# Patient Record
Sex: Female | Born: 1975 | Race: Black or African American | Hispanic: No | Marital: Married | Smoking: Never smoker
Health system: Southern US, Community
[De-identification: ages and names within clinical notes are randomized; demographics above are authoritative.]

## PROBLEM LIST (undated history)

## (undated) ENCOUNTER — Inpatient Hospital Stay (HOSPITAL_COMMUNITY): Payer: Self-pay

## (undated) DIAGNOSIS — B999 Unspecified infectious disease: Secondary | ICD-10-CM

## (undated) DIAGNOSIS — F419 Anxiety disorder, unspecified: Secondary | ICD-10-CM

## (undated) DIAGNOSIS — D649 Anemia, unspecified: Secondary | ICD-10-CM

## (undated) DIAGNOSIS — E119 Type 2 diabetes mellitus without complications: Secondary | ICD-10-CM

## (undated) DIAGNOSIS — K589 Irritable bowel syndrome without diarrhea: Secondary | ICD-10-CM

## (undated) DIAGNOSIS — I10 Essential (primary) hypertension: Secondary | ICD-10-CM

## (undated) DIAGNOSIS — K219 Gastro-esophageal reflux disease without esophagitis: Secondary | ICD-10-CM

## (undated) DIAGNOSIS — R7303 Prediabetes: Secondary | ICD-10-CM

## (undated) HISTORY — DX: Unspecified infectious disease: B99.9

## (undated) HISTORY — DX: Essential (primary) hypertension: I10

## (undated) HISTORY — DX: Irritable bowel syndrome, unspecified: K58.9

## (undated) HISTORY — PX: REDUCTION MAMMAPLASTY: SUR839

---
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
## 1999-03-05 ENCOUNTER — Encounter: Payer: Self-pay | Admitting: Emergency Medicine

## 1999-03-05 ENCOUNTER — Emergency Department (HOSPITAL_COMMUNITY): Admission: EM | Admit: 1999-03-05 | Discharge: 1999-03-05 | Payer: Self-pay | Admitting: Emergency Medicine

## 2001-01-06 ENCOUNTER — Other Ambulatory Visit: Admission: RE | Admit: 2001-01-06 | Discharge: 2001-01-06 | Payer: Self-pay | Admitting: Obstetrics and Gynecology

## 2001-11-29 ENCOUNTER — Inpatient Hospital Stay (HOSPITAL_COMMUNITY): Admission: AD | Admit: 2001-11-29 | Discharge: 2001-11-29 | Payer: Self-pay | Admitting: Obstetrics and Gynecology

## 2003-12-25 ENCOUNTER — Emergency Department (HOSPITAL_COMMUNITY): Admission: EM | Admit: 2003-12-25 | Discharge: 2003-12-25 | Payer: Self-pay | Admitting: Emergency Medicine

## 2004-05-11 ENCOUNTER — Ambulatory Visit (HOSPITAL_COMMUNITY): Admission: RE | Admit: 2004-05-11 | Discharge: 2004-05-11 | Payer: Self-pay | Admitting: Obstetrics and Gynecology

## 2004-08-12 ENCOUNTER — Emergency Department (HOSPITAL_COMMUNITY): Admission: EM | Admit: 2004-08-12 | Discharge: 2004-08-12 | Payer: Self-pay | Admitting: Family Medicine

## 2005-07-31 ENCOUNTER — Emergency Department (HOSPITAL_COMMUNITY): Admission: EM | Admit: 2005-07-31 | Discharge: 2005-07-31 | Payer: Self-pay | Admitting: Family Medicine

## 2007-01-05 ENCOUNTER — Emergency Department (HOSPITAL_COMMUNITY): Admission: EM | Admit: 2007-01-05 | Discharge: 2007-01-05 | Payer: Self-pay | Admitting: Emergency Medicine

## 2007-06-28 ENCOUNTER — Other Ambulatory Visit: Admission: RE | Admit: 2007-06-28 | Discharge: 2007-06-28 | Payer: Self-pay | Admitting: Obstetrics and Gynecology

## 2007-08-25 ENCOUNTER — Encounter: Admission: RE | Admit: 2007-08-25 | Discharge: 2007-08-25 | Payer: Self-pay | Admitting: Occupational Medicine

## 2009-05-28 ENCOUNTER — Ambulatory Visit (HOSPITAL_COMMUNITY): Admission: RE | Admit: 2009-05-28 | Discharge: 2009-05-28 | Payer: Self-pay | Admitting: Obstetrics and Gynecology

## 2009-10-23 ENCOUNTER — Inpatient Hospital Stay (HOSPITAL_COMMUNITY): Admission: AD | Admit: 2009-10-23 | Discharge: 2009-10-23 | Payer: Self-pay | Admitting: Obstetrics and Gynecology

## 2009-10-25 ENCOUNTER — Ambulatory Visit (HOSPITAL_COMMUNITY): Admission: RE | Admit: 2009-10-25 | Discharge: 2009-10-25 | Payer: Self-pay | Admitting: Obstetrics and Gynecology

## 2009-10-27 ENCOUNTER — Inpatient Hospital Stay (HOSPITAL_COMMUNITY): Admission: AD | Admit: 2009-10-27 | Discharge: 2009-11-01 | Payer: Self-pay | Admitting: Obstetrics and Gynecology

## 2009-10-29 ENCOUNTER — Encounter (INDEPENDENT_AMBULATORY_CARE_PROVIDER_SITE_OTHER): Payer: Self-pay | Admitting: Obstetrics and Gynecology

## 2010-05-08 ENCOUNTER — Ambulatory Visit (HOSPITAL_COMMUNITY): Admission: RE | Admit: 2010-05-08 | Payer: Self-pay | Source: Home / Self Care | Admitting: Obstetrics and Gynecology

## 2010-07-12 LAB — COMPREHENSIVE METABOLIC PANEL
ALT: 16 U/L (ref 0–35)
ALT: 20 U/L (ref 0–35)
ALT: 17 U/L (ref 0–35)
AST: 20 U/L (ref 0–37)
Albumin: 2.2 g/dL — ABNORMAL LOW (ref 3.5–5.2)
Albumin: 2.6 g/dL — ABNORMAL LOW (ref 3.5–5.2)
Albumin: 2.5 g/dL — ABNORMAL LOW (ref 3.5–5.2)
Alkaline Phosphatase: 119 U/L — ABNORMAL HIGH (ref 39–117)
Alkaline Phosphatase: 152 U/L — ABNORMAL HIGH (ref 39–117)
BUN: 2 mg/dL — ABNORMAL LOW (ref 6–23)
Calcium: 8.5 mg/dL (ref 8.4–10.5)
Calcium: 8.8 mg/dL (ref 8.4–10.5)
Chloride: 103 mEq/L (ref 96–112)
Chloride: 104 mEq/L (ref 96–112)
Creatinine, Ser: 0.51 mg/dL (ref 0.4–1.2)
Creatinine, Ser: 0.58 mg/dL (ref 0.4–1.2)
Creatinine, Ser: 0.46 mg/dL (ref 0.4–1.2)
GFR calc Af Amer: >60 mL/min (ref >60)
GFR calc Af Amer: >60 mL/min (ref >60)
GFR calc non Af Amer: >60 mL/min (ref >60)
GFR calc non Af Amer: >60 mL/min (ref >60)
GFR calc non Af Amer: >60 mL/min (ref >60)
Potassium: 3.1 mEq/L — ABNORMAL LOW (ref 3.5–5.1)
Sodium: 135 mEq/L (ref 135–145)
Sodium: 137 mEq/L (ref 135–145)
Sodium: 137 mEq/L (ref 135–145)
Sodium: 137 mEq/L (ref 135–145)
Total Bilirubin: 0.6 mg/dL (ref 0.3–1.2)
Total Protein: 6.7 g/dL (ref 6.0–8.3)

## 2010-07-12 LAB — URINALYSIS, ROUTINE W REFLEX MICROSCOPIC
Bilirubin Urine: NEGATIVE
Glucose, UA: NEGATIVE mg/dL
Ketones, ur: 40 mg/dL — AB
Protein, ur: NEGATIVE mg/dL

## 2010-07-12 LAB — CBC
HCT: 27.3 % — ABNORMAL LOW (ref 36.0–46.0)
HCT: 31.8 % — ABNORMAL LOW (ref 36.0–46.0)
Hemoglobin: 9.9 g/dL — ABNORMAL LOW (ref 12.0–15.0)
MCH: 30.6 pg (ref 26.0–34.0)
MCH: 30.6 pg (ref 26.0–34.0)
MCHC: 33.9 g/dL (ref 30.0–36.0)
MCHC: 33.9 g/dL (ref 30.0–36.0)
MCHC: 34.1 g/dL (ref 30.0–36.0)
MCHC: 34.2 g/dL (ref 30.0–36.0)
MCHC: 33.9 g/dL (ref 30.0–36.0)
MCV: 90 fL (ref 78.0–100.0)
MCV: 90.6 fL (ref 78.0–100.0)
MCV: 89.9 fL (ref 78.0–100.0)
Platelets: 230 10*3/uL (ref 150–400)
RBC: 2.99 MIL/uL — ABNORMAL LOW (ref 3.87–5.11)
RBC: 3.22 MIL/uL — ABNORMAL LOW (ref 3.87–5.11)
RBC: 3.53 MIL/uL — ABNORMAL LOW (ref 3.87–5.11)
RDW: 15.6 % — ABNORMAL HIGH (ref 11.5–15.5)
RDW: 15.8 % — ABNORMAL HIGH (ref 11.5–15.5)
RDW: 15.9 % — ABNORMAL HIGH (ref 11.5–15.5)
WBC: 8.3 10*3/uL (ref 4.0–10.5)
WBC: 9.7 10*3/uL (ref 4.0–10.5)
WBC: 10.1 10*3/uL (ref 4.0–10.5)

## 2010-07-12 LAB — URIC ACID
Uric Acid, Serum: 3.9 mg/dL (ref 2.4–7.0)
Uric Acid, Serum: 4.7 mg/dL (ref 2.4–7.0)
Uric Acid, Serum: 3.6 mg/dL (ref 2.4–7.0)

## 2010-07-12 LAB — CREATININE, URINE, 24 HOUR: Collection Interval-UCRE24: 24 hours

## 2010-07-12 LAB — LACTATE DEHYDROGENASE: LDH: 215 U/L (ref 94–250)

## 2010-07-12 LAB — PROTEIN, URINE, 24 HOUR
Collection Interval-UPROT: 24 hours
Protein, 24H Urine: 256 mg/d — ABNORMAL HIGH (ref 50–100)
Protein, Urine: 10 mg/dL
Urine Total Volume-UPROT: 2560 mL

## 2010-07-12 LAB — RPR: RPR Ser Ql: NONREACTIVE

## 2011-07-19 ENCOUNTER — Ambulatory Visit: Payer: Self-pay | Admitting: Obstetrics and Gynecology

## 2011-07-29 ENCOUNTER — Ambulatory Visit (INDEPENDENT_AMBULATORY_CARE_PROVIDER_SITE_OTHER): Payer: 59 | Admitting: Obstetrics and Gynecology

## 2011-07-29 DIAGNOSIS — Z01419 Encounter for gynecological examination (general) (routine) without abnormal findings: Secondary | ICD-10-CM

## 2011-07-30 ENCOUNTER — Ambulatory Visit: Payer: Self-pay | Admitting: Family Medicine

## 2011-08-13 ENCOUNTER — Ambulatory Visit: Payer: Self-pay | Admitting: Family Medicine

## 2011-08-19 ENCOUNTER — Encounter: Payer: Self-pay | Admitting: Family Medicine

## 2011-08-19 ENCOUNTER — Ambulatory Visit (INDEPENDENT_AMBULATORY_CARE_PROVIDER_SITE_OTHER): Payer: 59 | Admitting: Family Medicine

## 2011-08-19 VITALS — BP 126/80 | HR 100 | Resp 18 | Ht 66.0 in | Wt 238.1 lb

## 2011-08-19 DIAGNOSIS — E669 Obesity, unspecified: Secondary | ICD-10-CM

## 2011-08-19 DIAGNOSIS — F419 Anxiety disorder, unspecified: Secondary | ICD-10-CM | POA: Insufficient documentation

## 2011-08-19 DIAGNOSIS — J309 Allergic rhinitis, unspecified: Secondary | ICD-10-CM

## 2011-08-19 DIAGNOSIS — Z6835 Body mass index (BMI) 35.0-35.9, adult: Secondary | ICD-10-CM | POA: Insufficient documentation

## 2011-08-19 DIAGNOSIS — J302 Other seasonal allergic rhinitis: Secondary | ICD-10-CM | POA: Insufficient documentation

## 2011-08-19 DIAGNOSIS — F411 Generalized anxiety disorder: Secondary | ICD-10-CM

## 2011-08-19 DIAGNOSIS — I10 Essential (primary) hypertension: Secondary | ICD-10-CM

## 2011-08-19 NOTE — Assessment & Plan Note (Signed)
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 She is thinking about stopping all together which I agree with since she is not taking daily.

## 2011-08-19 NOTE — Assessment & Plan Note (Signed)
Given samples of claritin

## 2011-08-19 NOTE — Assessment & Plan Note (Signed)
BP at goal, no change to meds 

## 2011-08-19 NOTE — Patient Instructions (Signed)
You can try the Claritin for allergies Continue your blood pressure medication I recommend exercise 30 minutes 5 days a week  I will get the records - we will send copies  Hold on fish oil until I see the records Take a multivitamin  Fax immunizations from Employee health  F/U 4 months

## 2011-08-19 NOTE — Assessment & Plan Note (Signed)
Labs recently done, will obtain records for FLP Pt to work on weight loss, start exercise program

## 2011-08-19 NOTE — Progress Notes (Signed)
  Subjective:    Patient ID: Michelle Wilson, female    DOB: 1975-10-25, 36 y.o.   MRN: [id number]  HPI  Pt here to establish care, previous PCP [facility] - Dr. Phillips Odor GYN- Dr. Normand Sloop Hypertension-history of hypertension since age 39. She's been on medication since that time. His family history of high blood pressure which could mother and father a quiet visit very young ages. She did have difficulty with her blood pressure during the delivery of her child 2 years ago.  Anxiety- she has a lot of stress and anxiety regarding her work. 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 At times she does not sleep very well. She is also a working mother with the top of. She was prescribed Lexapro however she only takes 2.5 mg once a week to help her sleep. She's never taken this on a regular basis  Seasonal allergies- has not tried any OTC meds before.  Chronic constipation- uses fiber and water intake for treatment Review of Systems   GEN- denies fatigue, fever, weight loss,weakness, recent illness HEENT- denies eye drainage, change in vision, nasal discharge, CVS- denies chest pain, palpitations RESP- denies SOB, cough, wheeze ABD- denies N/V, +constipation, abd pain GU- denies dysuria, hematuria, dribbling, incontinence MSK- denies joint pain, muscle aches, injury Neuro- denies headache, dizziness, syncope, seizure activity Psych-+anxiety, denies depression      Objective:   Physical Exam GEN- NAD, alert and oriented x3, obese HEENT- PERRL, EOMI, non injected sclera, pink conjunctiva, MMM, oropharynx clear Neck- Supple, no thryomegaly CVS- RRR, no murmur RESP-CTAB ABD-NABS,soft, NT,ND EXT- No edema Pulses- Radial, DP- 2+ Psych- not overly anxious, or depressed appearing, normal affect, normal speech        Assessment & Plan:

## 2011-10-01 ENCOUNTER — Telehealth: Payer: Self-pay | Admitting: Obstetrics and Gynecology

## 2011-10-01 NOTE — Telephone Encounter (Signed)
Spoke with pt rgd msg pt states early preg was told by ND will need o change bp meds pt wants appt to discuss meds pt has appt 10/05/11 at 130 with ND pt voice understanding

## 2011-10-01 NOTE — Telephone Encounter (Signed)
Triage/cht received 

## 2011-10-05 ENCOUNTER — Encounter: Payer: Self-pay | Admitting: Obstetrics and Gynecology

## 2011-10-05 ENCOUNTER — Ambulatory Visit (INDEPENDENT_AMBULATORY_CARE_PROVIDER_SITE_OTHER): Payer: Commercial Managed Care - PPO | Admitting: Obstetrics and Gynecology

## 2011-10-05 VITALS — BP 128/70 | Wt 240.0 lb

## 2011-10-05 DIAGNOSIS — O169 Unspecified maternal hypertension, unspecified trimester: Secondary | ICD-10-CM

## 2011-10-05 DIAGNOSIS — N912 Amenorrhea, unspecified: Secondary | ICD-10-CM

## 2011-10-05 DIAGNOSIS — O139 Gestational [pregnancy-induced] hypertension without significant proteinuria, unspecified trimester: Secondary | ICD-10-CM

## 2011-10-05 LAB — POCT URINE PREGNANCY: Preg Test, Ur: POSITIVE

## 2011-10-05 MED ORDER — ONDANSETRON HCL 4 MG PO TABS: 4.0000 mg | ORAL_TABLET | Freq: Three times a day (TID) | ORAL | Status: AC | PRN

## 2011-10-05 MED ORDER — METHYLDOPA 250 MG PO TABS: 250.0000 mg | ORAL_TABLET | Freq: Three times a day (TID) | ORAL | Status: DC

## 2011-10-05 NOTE — Progress Notes (Signed)
Pt had positive upt and would like to discuss medications that are safe to take. Pt states she was told she could no longer continue her blood pressure medication once she became pregnant.  She is till taking the HYzaar.  She is no longer taking lexapro. She also has a little bit of nausea.  She states her last period was late an only lasted three days which is abnormal BP 128/70  Wt 240 lb (108.863 kg)  LMP 09/01/2011 Physical Examination: General appearance - alert, well appearing, and in no distress Mental status - alert, oriented to person, place, and time Chest - clear to auscultation, no wheezes, rales or rhonchi, symmetric air entry Heart - normal rate and regular rhythm Abdomen - soft, nontender, nondistended, no masses or organomegaly Extremities - peripheral pulses normal, no pedal edema, no clubbing or cyanosis IUP at 4 ans 6/7 weeks by dates Stop Mauri Reading it is a category D.  Will give aldomet 250 mg qd Pt had mildly elevated protein C and took baby ASA.  Pt thought she was told to stop it.  Will check MFM consult  zofran PRN [place] for [place] @[place]  Pt with h/o c/s.  She desires repeat chtn  Check baseline 24 hr urine and plan growth [place] to start @ 24 weeks

## 2011-10-20 ENCOUNTER — Encounter: Payer: Commercial Managed Care - PPO | Admitting: Obstetrics and Gynecology

## 2011-10-20 ENCOUNTER — Ambulatory Visit (INDEPENDENT_AMBULATORY_CARE_PROVIDER_SITE_OTHER): Payer: Commercial Managed Care - PPO

## 2011-10-20 DIAGNOSIS — N912 Amenorrhea, unspecified: Secondary | ICD-10-CM

## 2011-11-24 ENCOUNTER — Ambulatory Visit (INDEPENDENT_AMBULATORY_CARE_PROVIDER_SITE_OTHER): Payer: Commercial Managed Care - PPO | Admitting: Obstetrics and Gynecology

## 2011-11-24 DIAGNOSIS — Z331 Pregnant state, incidental: Secondary | ICD-10-CM

## 2011-11-24 MED ORDER — PANTOPRAZOLE SODIUM 20 MG PO TBEC: 20.0000 mg | DELAYED_RELEASE_TABLET | Freq: Every day | ORAL | Status: DC

## 2011-11-25 NOTE — Progress Notes (Signed)
Pt in office 11-24-11 for NOB work up pt schd in error pt to have New Ob Interview first pt c/o severe heart burn wants rx consult with ND pt can have protonix 20 mg pt advised rx sent to pharm pt voice understanding pt BP 118/72

## 2011-12-14 ENCOUNTER — Encounter: Payer: Self-pay | Admitting: Obstetrics and Gynecology

## 2011-12-14 ENCOUNTER — Ambulatory Visit (INDEPENDENT_AMBULATORY_CARE_PROVIDER_SITE_OTHER): Payer: Commercial Managed Care - PPO | Admitting: Obstetrics and Gynecology

## 2011-12-14 VITALS — BP 130/80 | Wt 257.0 lb

## 2011-12-14 DIAGNOSIS — E669 Obesity, unspecified: Secondary | ICD-10-CM

## 2011-12-14 DIAGNOSIS — Z9889 Other specified postprocedural states: Secondary | ICD-10-CM

## 2011-12-14 DIAGNOSIS — I10 Essential (primary) hypertension: Secondary | ICD-10-CM

## 2011-12-14 DIAGNOSIS — Z331 Pregnant state, incidental: Secondary | ICD-10-CM

## 2011-12-14 DIAGNOSIS — Z98891 History of uterine scar from previous surgery: Secondary | ICD-10-CM | POA: Insufficient documentation

## 2011-12-14 LAB — POCT WET PREP (WET MOUNT): Clue Cells Wet Prep Whiff POC: NEGATIVE

## 2011-12-14 LAB — POCT URINALYSIS DIPSTICK
Bilirubin, UA: NEGATIVE
Blood, UA: NEGATIVE
Glucose, UA: NEGATIVE
Ketones, UA: NEGATIVE
Spec Grav, UA: 1.015
pH, UA: 7

## 2011-12-14 NOTE — Patient Instructions (Signed)
ABCs of Pregnancy A Antepartum care is very important. Be sure you see your doctor and get prenatal care as soon as you think you are pregnant. At this time, you will be tested for infection, genetic abnormalities and potential problems with you and the pregnancy. This is the time to discuss diet, exercise, work, medications, labor, pain medication during labor and the possibility of a cesarean delivery. Ask any questions that may concern you. It is important to see your doctor regularly throughout your pregnancy. Avoid exposure to toxic substances and chemicals - such as cleaning solvents, lead and mercury, some insecticides, and paint. Pregnant women should avoid exposure to paint fumes, and fumes that cause you to feel ill, dizzy or faint. When possible, it is a good idea to have a pre-pregnancy consultation with your caregiver to begin some important recommendations your caregiver suggests such as, taking folic acid, exercising, quitting smoking, avoiding alcoholic beverages, etc. B Breastfeeding is the healthiest choice for both you and your baby. It has many nutritional benefits for the baby and health benefits for the mother. It also creates a very tight and loving bond between the baby and mother. Talk to your doctor, your family and friends, and your employer about how you choose to feed your baby and how they can support you in your decision. Not all birth defects can be prevented, but a woman can take actions that may increase her chance of having a healthy baby. Many birth defects happen very early in pregnancy, sometimes before a woman even knows she is pregnant. Birth defects or abnormalities of any child in your or the father's family should be discussed with your caregiver. Get a good support bra as your breast size changes. Wear it especially when you exercise and when nursing.  C Celebrate the news of your pregnancy with the your spouse/father and family. Childbirth classes are helpful to  take for you and the spouse/father because it helps to understand what happens during the pregnancy, labor and delivery. Cesarean delivery should be discussed with your doctor so you are prepared for that possibility. The pros and cons of circumcision if it is a boy, should be discussed with your pediatrician. Cigarette smoking during pregnancy can result in low birth weight babies. It has been associated with infertility, miscarriages, tubal pregnancies, infant death (mortality) and poor health (morbidity) in childhood. Additionally, cigarette smoking may cause long-term learning disabilities. If you smoke, you should try to quit before getting pregnant and not smoke during the pregnancy. Secondary smoke may also harm a mother and her developing baby. It is a good idea to ask people to stop smoking around you during your pregnancy and after the baby is born. Extra calcium is necessary when you are pregnant and is found in your prenatal vitamin, in dairy products, green leafy vegetables and in calcium supplements. D A healthy diet according to your current weight and height, along with vitamins and mineral supplements should be discussed with your caregiver. Domestic abuse or violence should be made known to your doctor right away to get the situation corrected. Drink more water when you exercise to keep hydrated. Discomfort of your back and legs usually develops and progresses from the middle of the second trimester through to delivery of the baby. This is because of the enlarging baby and uterus, which may also affect your balance. Do not take illegal drugs. Illegal drugs can seriously harm the baby and you. Drink extra fluids (water is best) throughout pregnancy to help   your body keep up with the increases in your blood volume. Drink at least 6 to 8 glasses of water, fruit juice, or milk each day. A good way to know you are drinking enough fluid is when your urine looks almost like clear water or is very light  yellow.  E Eat healthy to get the nutrients you and your unborn baby need. Your meals should include the five basic food groups. Exercise (30 minutes of light to moderate exercise a day) is important and encouraged during pregnancy, if there are no medical problems or problems with the pregnancy. Exercise that causes discomfort or dizziness should be stopped and reported to your caregiver. Emotions during pregnancy can change from being ecstatic to depression and should be understood by you, your partner and your family. F Fetal screening with ultrasound, amniocentesis and monitoring during pregnancy and labor is common and sometimes necessary. Take 400 micrograms of folic acid daily both before, when possible, and during the first few months of pregnancy to reduce the risk of birth defects of the brain and spine. All women who could possibly become pregnant should take a vitamin with folic acid, every day. It is also important to eat a healthy diet with fortified foods (enriched grain products, including cereals, rice, breads, and pastas) and foods with natural sources of folate (orange juice, green leafy vegetables, beans, peanuts, broccoli, asparagus, peas, and lentils). The father should be involved with all aspects of the pregnancy including, the prenatal care, childbirth classes, labor, delivery, and postpartum time. Fathers may also have emotional concerns about being a father, financial needs, and raising a family. G Genetic testing should be done appropriately. It is important to know your family and the father's history. If there have been problems with pregnancies or birth defects in your family, report these to your doctor. Also, genetic counselors can talk with you about the information you might need in making decisions about having a family. You can call a major medical center in your area for help in finding a board-certified genetic counselor. Genetic testing and counseling should be done  before pregnancy when possible, especially if there is a history of problems in the mother's or father's family. Certain ethnic backgrounds are more at risk for genetic defects. H Get familiar with the hospital where you will be having your baby. Get to know how long it takes to get there, the labor and delivery area, and the hospital procedures. Be sure your medical insurance is accepted there. Get your home ready for the baby including, clothes, the baby's room (when possible), furniture and car seat. Hand washing is important throughout the day, especially after handling raw meat and poultry, changing the baby's diaper or using the bathroom. This can help prevent the spread of many bacteria and viruses that cause infection. Your hair may become dry and thinner, but will return to normal a few weeks after the baby is born. Heartburn is a common problem that can be treated by taking antacids recommended by your caregiver, eating smaller meals 5 or 6 times a day, not drinking liquids when eating, drinking between meals and raising the head of your bed 2 to 3 inches. I Insurance to cover you, the baby, doctor and hospital should be reviewed so that you will be prepared to pay any costs not covered by your insurance plan. If you do not have medical insurance, there are usually clinics and services available for you in your community. Take 30 milligrams of iron during   your pregnancy as prescribed by your doctor to reduce the risk of low red blood cells (anemia) later in pregnancy. All women of childbearing age should eat a diet rich in iron. J There should be a joint effort for the mother, father and any other children to adapt to the pregnancy financially, emotionally, and psychologically during the pregnancy. Join a support group for moms-to-be. Or, join a class on parenting or childbirth. Have the family participate when possible. K Know your limits. Let your caregiver know if you experience any of the  following:   Pain of any kind.   Strong cramps.   You develop a lot of weight in a short period of time (5 pounds in 3 to 5 days).   Vaginal bleeding, leaking of amniotic fluid.   Headache, vision problems.   Dizziness, fainting, shortness of breath.   Chest pain.   Fever of 102 F (38.9 C) or higher.   Gush of clear fluid from your vagina.   Painful urination.   Domestic violence.   Irregular heartbeat (palpitations).   Rapid beating of the heart (tachycardia).   Constant feeling sick to your stomach (nauseous) and vomiting.   Trouble walking, fluid retention (edema).   Muscle weakness.   If your baby has decreased activity.   Persistent diarrhea.   Abnormal vaginal discharge.   Uterine contractions at 20-minute intervals.   Back pain that travels down your leg.  L Learn and practice that what you eat and drink should be in moderation and healthy for you and your baby. Legal drugs such as alcohol and caffeine are important issues for pregnant women. There is no safe amount of alcohol a woman can drink while pregnant. Fetal alcohol syndrome, a disorder characterized by growth retardation, facial abnormalities, and central nervous system dysfunction, is caused by a woman's use of alcohol during pregnancy. Caffeine, found in tea, coffee, soft drinks and chocolate, should also be limited. Be sure to read labels when trying to cut down on caffeine during pregnancy. More than 200 foods, beverages, and over-the-counter medications contain caffeine and have a high salt content! There are coffees and teas that do not contain caffeine. M Medical conditions such as diabetes, epilepsy, and high blood pressure should be treated and kept under control before pregnancy when possible, but especially during pregnancy. Ask your caregiver about any medications that may need to be changed or adjusted during pregnancy. If you are currently taking any medications, ask your caregiver if it  is safe to take them while you are pregnant or before getting pregnant when possible. Also, be sure to discuss any herbs or vitamins you are taking. They are medicines, too! Discuss with your doctor all medications, prescribed and over-the-counter, that you are taking. During your prenatal visit, discuss the medications your doctor may give you during labor and delivery. N Never be afraid to ask your doctor or caregiver questions about your health, the progress of the pregnancy, family problems, stressful situations, and recommendation for a pediatrician, if you do not have one. It is better to take all precautions and discuss any questions or concerns you may have during your office visits. It is a good idea to write down your questions before you visit the doctor. O Over-the-counter cough and cold remedies may contain alcohol or other ingredients that should be avoided during pregnancy. Ask your caregiver about prescription, herbs or over-the-counter medications that you are taking or may consider taking while pregnant.  P Physical activity during pregnancy can   benefit both you and your baby by lessening discomfort and fatigue, providing a sense of well-being, and increasing the likelihood of early recovery after delivery. Light to moderate exercise during pregnancy strengthens the belly (abdominal) and back muscles. This helps improve posture. Practicing yoga, walking, swimming, and cycling on a stationary bicycle are usually safe exercises for pregnant women. Avoid scuba diving, exercise at high altitudes (over 3000 feet), skiing, horseback riding, contact sports, etc. Always check with your doctor before beginning any kind of exercise, especially during pregnancy and especially if you did not exercise before getting pregnant. Q Queasiness, stomach upset and morning sickness are common during pregnancy. Eating a couple of crackers or dry toast before getting out of bed. Foods that you normally love may  make you feel sick to your stomach. You may need to substitute other nutritious foods. Eating 5 or 6 small meals a day instead of 3 large ones may make you feel better. Do not drink with your meals, drink between meals. Questions that you have should be written down and asked during your prenatal visits. R Read about and make plans to baby-proof your home. There are important tips for making your home a safer environment for your baby. Review the tips and make your home safer for you and your baby. Read food labels regarding calories, salt and fat content in the food. S Saunas, hot tubs, and steam rooms should be avoided while you are pregnant. Excessive high heat may be harmful during your pregnancy. Your caregiver will screen and examine you for sexually transmitted diseases and genetic disorders during your prenatal visits. Learn the signs of labor. Sexual relations while pregnant is safe unless there is a medical or pregnancy problem and your caregiver advises against it. T Traveling long distances should be avoided especially in the third trimester of your pregnancy. If you do have to travel out of state, be sure to take a copy of your medical records and medical insurance plan with you. You should not travel long distances without seeing your doctor first. Most airlines will not allow you to travel after 36 weeks of pregnancy. Toxoplasmosis is an infection caused by a parasite that can seriously harm an unborn baby. Avoid eating undercooked meat and handling cat litter. Be sure to wear gloves when gardening. Tingling of the hands and fingers is not unusual and is due to fluid retention. This will go away after the baby is born. U Womb (uterus) size increases during the first trimester. Your kidneys will begin to function more efficiently. This may cause you to feel the need to urinate more often. You may also leak urine when sneezing, coughing or laughing. This is due to the growing uterus pressing  against your bladder, which lies directly in front of and slightly under the uterus during the first few months of pregnancy. If you experience burning along with frequency of urination or bloody urine, be sure to tell your doctor. The size of your uterus in the third trimester may cause a problem with your balance. It is advisable to maintain good posture and avoid wearing high heels during this time. An ultrasound of your baby may be necessary during your pregnancy and is safe for you and your baby. V Vaccinations are an important concern for pregnant women. Get needed vaccines before pregnancy. Center for Disease Control (www.cdc.gov) has clear guidelines for the use of vaccines during pregnancy. Review the list, be sure to discuss it with your doctor. Prenatal vitamins are helpful   and healthy for you and the baby. Do not take extra vitamins except what is recommended. Taking too much of certain vitamins can cause overdose problems. Continuous vomiting should be reported to your caregiver. Varicose veins may appear especially if there is a family history of varicose veins. They should subside after the delivery of the baby. Support hose helps if there is leg discomfort. W Being overweight or underweight during pregnancy may cause problems. Try to get within 15 pounds of your ideal weight before pregnancy. Remember, pregnancy is not a time to be dieting! Do not stop eating or start skipping meals as your weight increases. Both you and your baby need the calories and nutrition you receive from a healthy diet. Be sure to consult with your doctor about your diet. There is a formula and diet plan available depending on whether you are overweight or underweight. Your caregiver or nutritionist can help and advise you if necessary. X Avoid X-rays. If you must have dental work or diagnostic tests, tell your dentist or physician that you are pregnant so that extra care can be taken. X-rays should only be taken when  the risks of not taking them outweigh the risk of taking them. If needed, only the minimum amount of radiation should be used. When X-rays are necessary, protective lead shields should be used to cover areas of the body that are not being X-rayed. Y Your baby loves you. Breastfeeding your baby creates a loving and very close bond between the two of you. Give your baby a healthy environment to live in while you are pregnant. Infants and children require constant care and guidance. Their health and safety should be carefully watched at all times. After the baby is born, rest or take a nap when the baby is sleeping. Z Get your ZZZs. Be sure to get plenty of rest. Resting on your side as often as possible, especially on your left side is advised. It provides the best circulation to your baby and helps reduce swelling. Try taking a nap for 30 to 45 minutes in the afternoon when possible. After the baby is born rest or take a nap when the baby is sleeping. Try elevating your feet for that amount of time when possible. It helps the circulation in your legs and helps reduce swelling.  Most information courtesy of the CDC. Document Released: 04/12/2005 Document Revised: 04/01/2011 Document Reviewed: 12/25/2008 [employer] Patient Information 2012 [employer]. 

## 2011-12-14 NOTE — Assessment & Plan Note (Signed)
Pt on aldomet Plan [place] for growth q 4 weeks start at 24 weeks bpp or nst 2x weekly start at 32 weeks

## 2011-12-14 NOTE — Progress Notes (Signed)
This is thew pts New OB visit She c/o ptylism and large weight gain Past Medical History  Diagnosis Date  . Hypertension     Aldomet 250 mg Q day  . Infection     Yeast inf; no frequent  . IBS (irritable bowel syndrome)     no issues currently   Past Surgical History  Procedure Date  . Breast reduction surgery 1997  . Cesarean section 2011   History   Social History  . Marital Status: Married    Spouse Name: Tyniah Kastens    Number of Children: 1  . Years of Education: N/A   Occupational History  . Print production planner    Social History Main Topics  . Smoking status: Never Smoker   . Smokeless tobacco: Never Used  . Alcohol Use: Yes     Occasionally  . Drug Use: No  . Sexually Active: Yes -- Female partner(s)   Other Topics Concern  . Not on file   Social History Narrative  . No narrative on file   Family History  Problem Relation Age of Onset  . Hypertension Mother   . Hypertension Father   . Hypertension Maternal Grandmother   . Hypertension Maternal Grandfather   . Hypertension Paternal Grandmother   . Hypertension Paternal Grandfather   . Hyperthyroidism Maternal Aunt     x 3  . Stroke Maternal Grandfather   . Schizophrenia Maternal Aunt   . Cancer Maternal Uncle     Lung deceased  Pt without complaints Physical Examination: General appearance - alert, well appearing, and in no distress Mental status - normal mood, behavior, speech, dress, motor activity, and thought processes Neck - supple, no significant adenopathy, thyroid exam: thyroid is normal in size without nodules or tenderness Chest - clear to auscultation, no wheezes, rales or rhonchi, symmetric air entry Heart - normal rate and regular rhythm Abdomen - soft, nontender, nondistended, no masses or organomegaly Breasts - breasts appear normal, no suspicious masses, no skin or nipple changes or axillary nodes Pelvic - normal external genitalia, vulva, vagina, cervix, uterus and adnexa Rectal -  normal rectal, no masses, rectal exam not indicated Back exam - full range of motion, no tenderness, palpable spasm or pain on motion Neurological - alert, oriented, normal speech, no focal findings or movement disorder noted Musculoskeletal - no joint tenderness, deformity or swelling Extremities - no edema, redness or tenderness in the calves or thighs Skin - normal coloration and turgor, no rashes, no suspicious skin lesions noted Routine exam Pap sent no normal 4/13 Pt declined genetic testing CHTN controlled on aldomet continue  ptylism check TSH pt declined antiemetics pt plans to have a repeat cesarean section RT 4 weeks for anantomy [place] and glucola

## 2011-12-14 NOTE — Assessment & Plan Note (Signed)
Do early glucola

## 2011-12-15 LAB — TSH: TSH: 1.109 u[IU]/mL (ref 0.350–4.500)

## 2011-12-15 LAB — PRENATAL PANEL VII
Antibody Screen: NEGATIVE
Eosinophils Absolute: 0.1 10*3/uL (ref 0.0–0.7)
Eosinophils Relative: 1 % (ref 0–5)
HCT: 31.1 % — ABNORMAL LOW (ref 36.0–46.0)
Lymphocytes Relative: 21 % (ref 12–46)
Lymphs Abs: 1.8 10*3/uL (ref 0.7–4.0)
MCH: 26.7 pg (ref 26.0–34.0)
MCV: 79.9 fL (ref 78.0–100.0)
Monocytes Absolute: 0.6 10*3/uL (ref 0.1–1.0)
Platelets: 258 10*3/uL (ref 150–400)
RBC: 3.89 MIL/uL (ref 3.87–5.11)
Rh Type: POSITIVE
Rubella: 15.7 IU/mL — ABNORMAL HIGH
WBC: 8.7 10*3/uL (ref 4.0–10.5)

## 2011-12-15 LAB — OB RESULTS CONSOLE RPR
RPR: NONREACTIVE
RPR: NONREACTIVE

## 2011-12-15 LAB — OB RESULTS CONSOLE HIV ANTIBODY (ROUTINE TESTING): HIV: NONREACTIVE

## 2011-12-16 LAB — CULTURE, OB URINE

## 2011-12-16 LAB — HEMOGLOBINOPATHY EVALUATION: Hgb A: 96.9 % (ref 96.8–97.8)

## 2011-12-20 ENCOUNTER — Ambulatory Visit: Payer: 59 | Admitting: Family Medicine

## 2012-01-11 ENCOUNTER — Ambulatory Visit (INDEPENDENT_AMBULATORY_CARE_PROVIDER_SITE_OTHER): Payer: 59

## 2012-01-11 ENCOUNTER — Ambulatory Visit (INDEPENDENT_AMBULATORY_CARE_PROVIDER_SITE_OTHER): Payer: 59 | Admitting: Obstetrics and Gynecology

## 2012-01-11 ENCOUNTER — Other Ambulatory Visit: Payer: 59

## 2012-01-11 ENCOUNTER — Encounter: Payer: Self-pay | Admitting: Obstetrics and Gynecology

## 2012-01-11 VITALS — BP 122/70 | Wt 259.0 lb

## 2012-01-11 DIAGNOSIS — O169 Unspecified maternal hypertension, unspecified trimester: Secondary | ICD-10-CM

## 2012-01-11 DIAGNOSIS — Z331 Pregnant state, incidental: Secondary | ICD-10-CM

## 2012-01-11 DIAGNOSIS — Z3689 Encounter for other specified antenatal screening: Secondary | ICD-10-CM

## 2012-01-11 DIAGNOSIS — I10 Essential (primary) hypertension: Secondary | ICD-10-CM

## 2012-01-11 NOTE — Patient Instructions (Signed)

## 2012-01-11 NOTE — Progress Notes (Signed)
[place] for anatomy s=d cx 3.65 cm Breech presentation Posterior placenta Normal fluid All anatomy that was seen was normal F/u anantomy at growth [place] Pt declined genetic testing glucola today RT 2 weeks BP controlled on aldomet Pt c/o constant back pain.  Recommend tylenol.  Pt declined PT

## 2012-01-11 NOTE — Progress Notes (Signed)
glucola due at 3:30

## 2012-01-12 ENCOUNTER — Encounter: Payer: Self-pay | Admitting: Family Medicine

## 2012-01-13 ENCOUNTER — Ambulatory Visit: Payer: Self-pay | Admitting: Family Medicine

## 2012-01-17 LAB — US OB COMP + 14 WK

## 2012-01-28 ENCOUNTER — Ambulatory Visit (INDEPENDENT_AMBULATORY_CARE_PROVIDER_SITE_OTHER): Payer: 59 | Admitting: Obstetrics and Gynecology

## 2012-01-28 ENCOUNTER — Encounter: Payer: Self-pay | Admitting: Obstetrics and Gynecology

## 2012-01-28 VITALS — BP 120/76 | Wt 265.0 lb

## 2012-01-28 DIAGNOSIS — O169 Unspecified maternal hypertension, unspecified trimester: Secondary | ICD-10-CM

## 2012-01-28 DIAGNOSIS — I10 Essential (primary) hypertension: Secondary | ICD-10-CM

## 2012-01-28 NOTE — Progress Notes (Signed)
Pt without c/o CHTN BP well controlled RT 2 weeks

## 2012-01-28 NOTE — Progress Notes (Signed)
Pt would like to know when she is supposed to have f/u anatomy u/s

## 2012-02-11 ENCOUNTER — Ambulatory Visit (INDEPENDENT_AMBULATORY_CARE_PROVIDER_SITE_OTHER): Payer: 59 | Admitting: Obstetrics and Gynecology

## 2012-02-11 ENCOUNTER — Encounter: Payer: Self-pay | Admitting: Obstetrics and Gynecology

## 2012-02-11 VITALS — BP 140/86 | Wt 265.0 lb

## 2012-02-11 DIAGNOSIS — Z23 Encounter for immunization: Secondary | ICD-10-CM

## 2012-02-11 DIAGNOSIS — O169 Unspecified maternal hypertension, unspecified trimester: Secondary | ICD-10-CM

## 2012-02-11 NOTE — Progress Notes (Signed)
Pt unsure if she will get flu shot here or at her job.

## 2012-02-11 NOTE — Progress Notes (Signed)
Pt without c/o She desires fllu vaccine.  We will give today Will check her BP QD on her job.  RT 4 weeks [place] for growth and glucola @[place] 

## 2012-02-11 NOTE — Patient Instructions (Signed)
Fetal Movement Counts Patient Name: __________________________________________________ Patient Due Date: ____________________ Kick counts is highly recommended in high risk pregnancies, but it is a good idea for every pregnant woman to do. Start counting fetal movements at 28 weeks of the pregnancy. Fetal movements increase after eating a full meal or eating or drinking something sweet (the blood sugar is higher). It is also important to drink plenty of fluids (well hydrated) before doing the count. Lie on your left side because it helps with the circulation or you can sit in a comfortable chair with your arms over your belly (abdomen) with no distractions around you. DOING THE COUNT  Try to do the count the same time of day each time you do it.  Mark the day and time, then see how long it takes for you to feel 10 movements (kicks, flutters, swishes, rolls). You should have at least 10 movements within 2 hours. You will most likely feel 10 movements in much less than 2 hours. If you do not, wait an hour and count again. After a couple of days you will see a pattern.  What you are looking for is a change in the pattern or not enough counts in 2 hours. Is it taking longer in time to reach 10 movements? SEEK MEDICAL CARE IF:  You feel less than 10 counts in 2 hours. Tried twice.  No movement in one hour.  The pattern is changing or taking longer each day to reach 10 counts in 2 hours.  You feel the baby is not moving as it usually does. Date: ____________ Movements: ____________ Start time: ____________ Finish time: ____________  Date: ____________ Movements: ____________ Start time: ____________ Finish time: ____________ Date: ____________ Movements: ____________ Start time: ____________ Finish time: ____________ Date: ____________ Movements: ____________ Start time: ____________ Finish time: ____________ Date: ____________ Movements: ____________ Start time: ____________ Finish time:  ____________ Date: ____________ Movements: ____________ Start time: ____________ Finish time: ____________ Date: ____________ Movements: ____________ Start time: ____________ Finish time: ____________ Date: ____________ Movements: ____________ Start time: ____________ Finish time: ____________  Date: ____________ Movements: ____________ Start time: ____________ Finish time: ____________ Date: ____________ Movements: ____________ Start time: ____________ Finish time: ____________ Date: ____________ Movements: ____________ Start time: ____________ Finish time: ____________ Date: ____________ Movements: ____________ Start time: ____________ Finish time: ____________ Date: ____________ Movements: ____________ Start time: ____________ Finish time: ____________ Date: ____________ Movements: ____________ Start time: ____________ Finish time: ____________ Date: ____________ Movements: ____________ Start time: ____________ Finish time: ____________  Date: ____________ Movements: ____________ Start time: ____________ Finish time: ____________ Date: ____________ Movements: ____________ Start time: ____________ Finish time: ____________ Date: ____________ Movements: ____________ Start time: ____________ Finish time: ____________ Date: ____________ Movements: ____________ Start time: ____________ Finish time: ____________ Date: ____________ Movements: ____________ Start time: ____________ Finish time: ____________ Date: ____________ Movements: ____________ Start time: ____________ Finish time: ____________ Date: ____________ Movements: ____________ Start time: ____________ Finish time: ____________  Date: ____________ Movements: ____________ Start time: ____________ Finish time: ____________ Date: ____________ Movements: ____________ Start time: ____________ Finish time: ____________ Date: ____________ Movements: ____________ Start time: ____________ Finish time: ____________ Date: ____________ Movements:  ____________ Start time: ____________ Finish time: ____________ Date: ____________ Movements: ____________ Start time: ____________ Finish time: ____________ Date: ____________ Movements: ____________ Start time: ____________ Finish time: ____________ Date: ____________ Movements: ____________ Start time: ____________ Finish time: ____________  Date: ____________ Movements: ____________ Start time: ____________ Finish time: ____________ Date: ____________ Movements: ____________ Start time: ____________ Finish time: ____________ Date: ____________ Movements: ____________ Start time: ____________ Finish time: ____________ Date: ____________ Movements:   ____________ Start time: ____________ Finish time: ____________ Date: ____________ Movements: ____________ Start time: ____________ Finish time: ____________ Date: ____________ Movements: ____________ Start time: ____________ Finish time: ____________ Date: ____________ Movements: ____________ Start time: ____________ Finish time: ____________  Date: ____________ Movements: ____________ Start time: ____________ Finish time: ____________ Date: ____________ Movements: ____________ Start time: ____________ Finish time: ____________ Date: ____________ Movements: ____________ Start time: ____________ Finish time: ____________ Date: ____________ Movements: ____________ Start time: ____________ Finish time: ____________ Date: ____________ Movements: ____________ Start time: ____________ Finish time: ____________ Date: ____________ Movements: ____________ Start time: ____________ Finish time: ____________ Date: ____________ Movements: ____________ Start time: ____________ Finish time: ____________  Date: ____________ Movements: ____________ Start time: ____________ Finish time: ____________ Date: ____________ Movements: ____________ Start time: ____________ Finish time: ____________ Date: ____________ Movements: ____________ Start time: ____________ Finish  time: ____________ Date: ____________ Movements: ____________ Start time: ____________ Finish time: ____________ Date: ____________ Movements: ____________ Start time: ____________ Finish time: ____________ Date: ____________ Movements: ____________ Start time: ____________ Finish time: ____________ Date: ____________ Movements: ____________ Start time: ____________ Finish time: ____________  Date: ____________ Movements: ____________ Start time: ____________ Finish time: ____________ Date: ____________ Movements: ____________ Start time: ____________ Finish time: ____________ Date: ____________ Movements: ____________ Start time: ____________ Finish time: ____________ Date: ____________ Movements: ____________ Start time: ____________ Finish time: ____________ Date: ____________ Movements: ____________ Start time: ____________ Finish time: ____________ Date: ____________ Movements: ____________ Start time: ____________ Finish time: ____________ Document Released: 05/12/2006 Document Revised: 07/05/2011 Document Reviewed: 11/12/2008 [employer] Patient Information 2013 [employer].  

## 2012-02-24 ENCOUNTER — Telehealth: Payer: Self-pay | Admitting: Obstetrics and Gynecology

## 2012-02-24 NOTE — Telephone Encounter (Signed)
Spoke with pt rgd msg pt wants to know what she can do for the sty on her eyelid been there for about a week advised pt to consult with pcp pt voice understanding

## 2012-02-29 ENCOUNTER — Ambulatory Visit (INDEPENDENT_AMBULATORY_CARE_PROVIDER_SITE_OTHER): Payer: 59 | Admitting: Family Medicine

## 2012-02-29 ENCOUNTER — Encounter: Payer: Self-pay | Admitting: Family Medicine

## 2012-02-29 VITALS — BP 136/76 | HR 106 | Resp 18 | Ht 66.0 in | Wt 264.0 lb

## 2012-02-29 DIAGNOSIS — J3489 Other specified disorders of nose and nasal sinuses: Secondary | ICD-10-CM

## 2012-02-29 DIAGNOSIS — R0981 Nasal congestion: Secondary | ICD-10-CM

## 2012-02-29 DIAGNOSIS — H00014 Hordeolum externum left upper eyelid: Secondary | ICD-10-CM

## 2012-02-29 DIAGNOSIS — H00019 Hordeolum externum unspecified eye, unspecified eyelid: Secondary | ICD-10-CM

## 2012-02-29 MED ORDER — ERYTHROMYCIN 5 MG/GM OP OINT: TOPICAL_OINTMENT | Freq: Two times a day (BID) | OPHTHALMIC | Status: DC

## 2012-02-29 MED ORDER — FLUTICASONE PROPIONATE 50 MCG/ACT NA SUSP: 1.0000 | Freq: Two times a day (BID) | NASAL | Status: DC

## 2012-02-29 NOTE — Assessment & Plan Note (Signed)
Will have her do warm compresses 4 times a day Add erythromycin topical antibiotic If no improvement in 1 week, refer to [facility]tho[facility] to have this drained

## 2012-02-29 NOTE — Patient Instructions (Signed)
Use flonase twice a day for nasal congestion, you can also use nasal saline Erythromycin for your left eye lid Warm compresses for 15 minutes 4-5 times a day Call if this does not improve over the next week, then referral to eye doctor

## 2012-02-29 NOTE — Progress Notes (Signed)
  Subjective:    Patient ID: Michelle Wilson, female    DOB: 01-03-1976, 36 y.o.   MRN: [id number]  HPI Style left eye for the past 2 weeks. She's not really using a warm compresses or anything else on the eye. She's had some drainage from eye on a few occasions where she is woke up her asthmatic but this is been very rare. She denies any fever. She has had some nasal congestion and drainage. Denies cough, shortness of breath, chest pain. No sick contacts. She did receive her flu shot. Currently [redacted] weeks pregnant    Review of Systems  GEN- denies fatigue, fever, weight loss,weakness, recent illness HEENT- denies eye drainage, change in vision,+ nasal discharge, CVS- denies chest pain, palpitations RESP- denies SOB, cough, wheeze ABD- denies N/V, change in stools, abd pain MSK- denies joint pain, muscle aches, injury Neuro- denies headache, dizziness, syncope, seizure activity      Objective:   Physical Exam GEN- NAD, alert and oriented x3 HEENT- PERRL, EOMI, non injected sclera, pink conjunctiva, MMM, oropharynx clear, TM clear bilat no effusion, moderate size stye on left upper lid, minimal TTP, mild erythema surrounding, no discharge +nasal discharge, +inflammed turbinates Neck- Supple, noLAD CVS- mild tachycardia, no murmur RESP-CTAB EXT- No edema Pulses- Radial 2+        Assessment & Plan:

## 2012-02-29 NOTE — Assessment & Plan Note (Signed)
Short course of flonase

## 2012-03-03 ENCOUNTER — Telehealth: Payer: Self-pay | Admitting: Family Medicine

## 2012-03-03 ENCOUNTER — Telehealth: Payer: Self-pay | Admitting: Obstetrics and Gynecology

## 2012-03-03 NOTE — Telephone Encounter (Signed)
Tc to pt per telephone call. Pt c/o nasal drainage since 02/26/12. Pt began having thick,green, mucous nasal drainage on 02/29/12. Pt went to see PCP on 02/29/12 and was given Flonase. Pt states,"PCP uncomfortable prescribing ATB's due to pt being pregnant". No fever. No cough. Pt now having mild, sore throat. Will consult with provider per recs. Pt ask to have consult with ND only.

## 2012-03-03 NOTE — Telephone Encounter (Signed)
Tc to pt per ND recs. Pt needs eval. Offered pt an appt for eval. Pt declines appt today. Appt sched 03/06/12 @ 4:30 with [facility] for eval. Pt to continue Flonase til eval per ND. Pt to call after hours over the weekend if fever occurs. Pt agrees.

## 2012-03-03 NOTE — Telephone Encounter (Signed)
Patient aware that physician is not in the office.  She has also called OB/GYN.

## 2012-03-06 ENCOUNTER — Encounter: Payer: 59 | Admitting: Obstetrics and Gynecology

## 2012-03-06 MED ORDER — PENICILLIN V POTASSIUM 500 MG PO TABS: 500.0000 mg | ORAL_TABLET | Freq: Two times a day (BID) | ORAL | Status: DC

## 2012-03-06 MED ORDER — FLUCONAZOLE 150 MG PO TABS: 150.0000 mg | ORAL_TABLET | Freq: Once | ORAL | Status: AC

## 2012-03-06 NOTE — Addendum Note (Signed)
Addended by: Milinda Antis F on: 03/06/2012 12:18 PM   Modules accepted: Orders

## 2012-03-06 NOTE — Telephone Encounter (Signed)
Diflucan sent

## 2012-03-06 NOTE — Addendum Note (Signed)
Addended by: Milinda Antis F on: 03/06/2012 12:05 PM   Modules accepted: Orders

## 2012-03-06 NOTE — Telephone Encounter (Signed)
Penicillin sent in.

## 2012-03-06 NOTE — Telephone Encounter (Signed)
Patient states that stye is getting better.  And she was not able to get an answer from gyn.  She is asking if something can be sent in or can she come in for a visit.

## 2012-03-06 NOTE — Telephone Encounter (Signed)
Patient aware. And asking for something for yeast.

## 2012-03-06 NOTE — Telephone Encounter (Signed)
Please check on to see if pt received medication and if the stye is still present on her eye. I will send if antibiotics if she has not received any from her GYN

## 2012-03-10 ENCOUNTER — Encounter: Payer: Self-pay | Admitting: Obstetrics and Gynecology

## 2012-03-10 ENCOUNTER — Ambulatory Visit (INDEPENDENT_AMBULATORY_CARE_PROVIDER_SITE_OTHER): Payer: 59 | Admitting: Obstetrics and Gynecology

## 2012-03-10 ENCOUNTER — Other Ambulatory Visit: Payer: 59

## 2012-03-10 ENCOUNTER — Ambulatory Visit (INDEPENDENT_AMBULATORY_CARE_PROVIDER_SITE_OTHER): Payer: 59

## 2012-03-10 VITALS — BP 112/70 | Wt 267.0 lb

## 2012-03-10 DIAGNOSIS — O10919 Unspecified pre-existing hypertension complicating pregnancy, unspecified trimester: Secondary | ICD-10-CM

## 2012-03-10 DIAGNOSIS — O10019 Pre-existing essential hypertension complicating pregnancy, unspecified trimester: Secondary | ICD-10-CM

## 2012-03-10 DIAGNOSIS — O169 Unspecified maternal hypertension, unspecified trimester: Secondary | ICD-10-CM

## 2012-03-10 DIAGNOSIS — Z331 Pregnant state, incidental: Secondary | ICD-10-CM

## 2012-03-10 LAB — US OB FOLLOW UP

## 2012-03-10 NOTE — Progress Notes (Signed)
[redacted]w[redacted]d  Pt had U/S for growth Pt had Glucola

## 2012-03-10 NOTE — Progress Notes (Signed)
[redacted]w[redacted]d  GFM CHTN on Aldomet 250 mg am Glucola today

## 2012-03-10 NOTE — Progress Notes (Signed)
Ultrasound shows:  SIUP  S=D     [place] EDD: 05/29/2012           EFW: 2 lb 8 oz 57.2%           AFI: 17.90           Cervical length: 4.33 cm           Placenta localization: posterior           Fetal presentation: vertex Comments: AFI is normal (65th%) RVOT, Ductal arch still unseen due to fetal position

## 2012-03-11 LAB — RPR

## 2012-03-11 LAB — GLUCOSE TOLERANCE, 1 HOUR (50G) W/O FASTING: Glucose, 1 Hour GTT: 82 mg/dL (ref 70–140)

## 2012-03-22 ENCOUNTER — Encounter: Payer: Self-pay | Admitting: Obstetrics and Gynecology

## 2012-03-22 ENCOUNTER — Ambulatory Visit (INDEPENDENT_AMBULATORY_CARE_PROVIDER_SITE_OTHER): Payer: 59 | Admitting: Obstetrics and Gynecology

## 2012-03-22 VITALS — BP 120/68 | Wt 267.0 lb

## 2012-03-22 DIAGNOSIS — O169 Unspecified maternal hypertension, unspecified trimester: Secondary | ICD-10-CM

## 2012-03-22 NOTE — Progress Notes (Signed)
[redacted]w[redacted]d Glucola normal Desires BTL: reviewed, questions answered. Will add to procedure CHTN: Aldomet 250 mg daily

## 2012-03-22 NOTE — Progress Notes (Signed)
[redacted]w[redacted]d  Pt has no complaints

## 2012-03-26 ENCOUNTER — Telehealth: Payer: Self-pay | Admitting: Obstetrics and Gynecology

## 2012-03-26 NOTE — Telephone Encounter (Signed)
TC from pt at 29wks to report an episode of dizziness yesterday, it was after laying and getting out of bed quickly. Had another episode of feeling dizzy today after she was cleaning up. She took her BP w a home monitor and it was 140's/90's. She denies any complaints now. She takes aldomet for [facility]. Denies any HA/N/V no ctx, VB +FM. Reassured pt and explained that home monitor was probably not that accurate. Enc PO hydration and changing positions slowly, offered MAU visit, pt declined. Enc to call if sx's return

## 2012-03-28 ENCOUNTER — Telehealth: Payer: Self-pay | Admitting: Obstetrics and Gynecology

## 2012-03-28 NOTE — Telephone Encounter (Signed)
Repeat C/S & BTL scheduled for 05/31/12 @ 9:30 with [facility]. -Adrianne Pridgen

## 2012-03-29 ENCOUNTER — Other Ambulatory Visit: Payer: Self-pay | Admitting: Obstetrics and Gynecology

## 2012-04-05 ENCOUNTER — Encounter: Payer: Self-pay | Admitting: Obstetrics and Gynecology

## 2012-04-05 ENCOUNTER — Ambulatory Visit (INDEPENDENT_AMBULATORY_CARE_PROVIDER_SITE_OTHER): Payer: 59

## 2012-04-05 ENCOUNTER — Ambulatory Visit (INDEPENDENT_AMBULATORY_CARE_PROVIDER_SITE_OTHER): Payer: 59 | Admitting: Obstetrics and Gynecology

## 2012-04-05 VITALS — BP 122/60 | Wt 268.0 lb

## 2012-04-05 DIAGNOSIS — I10 Essential (primary) hypertension: Secondary | ICD-10-CM

## 2012-04-05 DIAGNOSIS — O169 Unspecified maternal hypertension, unspecified trimester: Secondary | ICD-10-CM

## 2012-04-05 NOTE — Progress Notes (Addendum)
[redacted]w[redacted]d Doing well. Ultrasound: Single gestation, breech, normal fluid, 3 lbs. 15 oz. (65th percentile), cervix 3.16 cm. RTO 1 week. BPP.  CS discussed. AVS

## 2012-04-05 NOTE — Progress Notes (Signed)
[redacted]w[redacted]d  Pt has no issues today.

## 2012-04-06 ENCOUNTER — Other Ambulatory Visit: Payer: Self-pay

## 2012-04-07 ENCOUNTER — Encounter: Payer: 59 | Admitting: Obstetrics and Gynecology

## 2012-04-12 ENCOUNTER — Ambulatory Visit (INDEPENDENT_AMBULATORY_CARE_PROVIDER_SITE_OTHER): Payer: 59 | Admitting: Obstetrics and Gynecology

## 2012-04-12 ENCOUNTER — Ambulatory Visit (INDEPENDENT_AMBULATORY_CARE_PROVIDER_SITE_OTHER): Payer: 59

## 2012-04-12 VITALS — BP 122/72 | Wt 271.0 lb

## 2012-04-12 DIAGNOSIS — I10 Essential (primary) hypertension: Secondary | ICD-10-CM

## 2012-04-12 DIAGNOSIS — Z98891 History of uterine scar from previous surgery: Secondary | ICD-10-CM

## 2012-04-12 DIAGNOSIS — Z9889 Other specified postprocedural states: Secondary | ICD-10-CM

## 2012-04-12 NOTE — Progress Notes (Addendum)
[redacted]w[redacted]d HTN on aldomet U/S today AFI 15.5, cvx 3.4cm, BPP 8/8, vtx, post placenta, nl fluid Cont BPP qwk, will also need growth again in 3wks FKCs Questions answered Reports leaking urine occas with movement

## 2012-04-14 LAB — US OB FOLLOW UP

## 2012-04-21 ENCOUNTER — Encounter: Payer: Self-pay | Admitting: Obstetrics and Gynecology

## 2012-04-21 ENCOUNTER — Ambulatory Visit (INDEPENDENT_AMBULATORY_CARE_PROVIDER_SITE_OTHER): Payer: 59 | Admitting: Obstetrics and Gynecology

## 2012-04-21 ENCOUNTER — Ambulatory Visit (INDEPENDENT_AMBULATORY_CARE_PROVIDER_SITE_OTHER): Payer: 59

## 2012-04-21 VITALS — BP 120/82 | Wt 273.0 lb

## 2012-04-21 DIAGNOSIS — O169 Unspecified maternal hypertension, unspecified trimester: Secondary | ICD-10-CM

## 2012-04-21 DIAGNOSIS — I10 Essential (primary) hypertension: Secondary | ICD-10-CM

## 2012-04-21 LAB — US OB FOLLOW UP

## 2012-04-21 NOTE — Patient Instructions (Signed)
Fetal Movement Counts Patient Name: __________________________________________________ Patient Due Date: ____________________ Kick counts is highly recommended in high risk pregnancies, but it is a good idea for every pregnant woman to do. Start counting fetal movements at 28 weeks of the pregnancy. Fetal movements increase after eating a full meal or eating or drinking something sweet (the blood sugar is higher). It is also important to drink plenty of fluids (well hydrated) before doing the count. Lie on your left side because it helps with the circulation or you can sit in a comfortable chair with your arms over your belly (abdomen) with no distractions around you. DOING THE COUNT  Try to do the count the same time of day each time you do it.  Mark the day and time, then see how long it takes for you to feel 10 movements (kicks, flutters, swishes, rolls). You should have at least 10 movements within 2 hours. You will most likely feel 10 movements in much less than 2 hours. If you do not, wait an hour and count again. After a couple of days you will see a pattern.  What you are looking for is a change in the pattern or not enough counts in 2 hours. Is it taking longer in time to reach 10 movements? SEEK MEDICAL CARE IF:  You feel less than 10 counts in 2 hours. Tried twice.  No movement in one hour.  The pattern is changing or taking longer each day to reach 10 counts in 2 hours.  You feel the baby is not moving as it usually does. Date: ____________ Movements: ____________ Start time: ____________ Finish time: ____________  Date: ____________ Movements: ____________ Start time: ____________ Finish time: ____________ Date: ____________ Movements: ____________ Start time: ____________ Finish time: ____________ Date: ____________ Movements: ____________ Start time: ____________ Finish time: ____________ Date: ____________ Movements: ____________ Start time: ____________ Finish time:  ____________ Date: ____________ Movements: ____________ Start time: ____________ Finish time: ____________ Date: ____________ Movements: ____________ Start time: ____________ Finish time: ____________ Date: ____________ Movements: ____________ Start time: ____________ Finish time: ____________  Date: ____________ Movements: ____________ Start time: ____________ Finish time: ____________ Date: ____________ Movements: ____________ Start time: ____________ Finish time: ____________ Date: ____________ Movements: ____________ Start time: ____________ Finish time: ____________ Date: ____________ Movements: ____________ Start time: ____________ Finish time: ____________ Date: ____________ Movements: ____________ Start time: ____________ Finish time: ____________ Date: ____________ Movements: ____________ Start time: ____________ Finish time: ____________ Date: ____________ Movements: ____________ Start time: ____________ Finish time: ____________  Date: ____________ Movements: ____________ Start time: ____________ Finish time: ____________ Date: ____________ Movements: ____________ Start time: ____________ Finish time: ____________ Date: ____________ Movements: ____________ Start time: ____________ Finish time: ____________ Date: ____________ Movements: ____________ Start time: ____________ Finish time: ____________ Date: ____________ Movements: ____________ Start time: ____________ Finish time: ____________ Date: ____________ Movements: ____________ Start time: ____________ Finish time: ____________ Date: ____________ Movements: ____________ Start time: ____________ Finish time: ____________  Date: ____________ Movements: ____________ Start time: ____________ Finish time: ____________ Date: ____________ Movements: ____________ Start time: ____________ Finish time: ____________ Date: ____________ Movements: ____________ Start time: ____________ Finish time: ____________ Date: ____________ Movements:  ____________ Start time: ____________ Finish time: ____________ Date: ____________ Movements: ____________ Start time: ____________ Finish time: ____________ Date: ____________ Movements: ____________ Start time: ____________ Finish time: ____________ Date: ____________ Movements: ____________ Start time: ____________ Finish time: ____________  Date: ____________ Movements: ____________ Start time: ____________ Finish time: ____________ Date: ____________ Movements: ____________ Start time: ____________ Finish time: ____________ Date: ____________ Movements: ____________ Start time: ____________ Finish time: ____________ Date: ____________ Movements:   ____________ Start time: ____________ Finish time: ____________ Date: ____________ Movements: ____________ Start time: ____________ Finish time: ____________ Date: ____________ Movements: ____________ Start time: ____________ Finish time: ____________ Date: ____________ Movements: ____________ Start time: ____________ Finish time: ____________  Date: ____________ Movements: ____________ Start time: ____________ Finish time: ____________ Date: ____________ Movements: ____________ Start time: ____________ Finish time: ____________ Date: ____________ Movements: ____________ Start time: ____________ Finish time: ____________ Date: ____________ Movements: ____________ Start time: ____________ Finish time: ____________ Date: ____________ Movements: ____________ Start time: ____________ Finish time: ____________ Date: ____________ Movements: ____________ Start time: ____________ Finish time: ____________ Date: ____________ Movements: ____________ Start time: ____________ Finish time: ____________  Date: ____________ Movements: ____________ Start time: ____________ Finish time: ____________ Date: ____________ Movements: ____________ Start time: ____________ Finish time: ____________ Date: ____________ Movements: ____________ Start time: ____________ Finish  time: ____________ Date: ____________ Movements: ____________ Start time: ____________ Finish time: ____________ Date: ____________ Movements: ____________ Start time: ____________ Finish time: ____________ Date: ____________ Movements: ____________ Start time: ____________ Finish time: ____________ Date: ____________ Movements: ____________ Start time: ____________ Finish time: ____________  Date: ____________ Movements: ____________ Start time: ____________ Finish time: ____________ Date: ____________ Movements: ____________ Start time: ____________ Finish time: ____________ Date: ____________ Movements: ____________ Start time: ____________ Finish time: ____________ Date: ____________ Movements: ____________ Start time: ____________ Finish time: ____________ Date: ____________ Movements: ____________ Start time: ____________ Finish time: ____________ Date: ____________ Movements: ____________ Start time: ____________ Finish time: ____________ Document Released: 05/12/2006 Document Revised: 07/05/2011 Document Reviewed: 11/12/2008 [employer] Patient Information 2013 [employer].  

## 2012-04-21 NOTE — Progress Notes (Signed)
[redacted]w[redacted]d EFW 4-14 46.8% AFI 15.6 cm cx 3.70 cm vtx pres Post placenta BPP 8/8 [redacted]w[redacted]d BPP@[place]  CHTN BP stable

## 2012-04-28 ENCOUNTER — Other Ambulatory Visit: Payer: Self-pay

## 2012-04-28 ENCOUNTER — Telehealth: Payer: Self-pay | Admitting: Obstetrics and Gynecology

## 2012-04-28 ENCOUNTER — Telehealth: Payer: Self-pay | Admitting: Family Medicine

## 2012-04-28 ENCOUNTER — Other Ambulatory Visit: Payer: Self-pay | Admitting: Family Medicine

## 2012-04-28 DIAGNOSIS — O1002 Pre-existing essential hypertension complicating childbirth: Secondary | ICD-10-CM

## 2012-04-28 NOTE — Telephone Encounter (Signed)
Spoke with pt rgd msg pt c/o sinus infection no fever just green mucus drainage pt has nasal spray and Claritin no relief pt want rx for pcn advised pt ned ov for rx pt will call pcp and see if they will refill rx since they originally wrote it

## 2012-04-28 NOTE — Telephone Encounter (Signed)
Unfortunately I last prescribed the medication in Nov 2012, you would need to be seen if you still sick.  I would recommend calling your OB/GYN first.

## 2012-04-28 NOTE — Telephone Encounter (Signed)
She would need to be seen, I last treated her Mar 04, 2011.  I would recommend she call her OB/GYN first.

## 2012-04-28 NOTE — Telephone Encounter (Signed)
Patient aware and states that she will see OB on Monday 1/6

## 2012-05-01 ENCOUNTER — Encounter: Payer: Self-pay | Admitting: Obstetrics and Gynecology

## 2012-05-01 ENCOUNTER — Ambulatory Visit (INDEPENDENT_AMBULATORY_CARE_PROVIDER_SITE_OTHER): Payer: 59

## 2012-05-01 ENCOUNTER — Ambulatory Visit (INDEPENDENT_AMBULATORY_CARE_PROVIDER_SITE_OTHER): Payer: 59 | Admitting: Obstetrics and Gynecology

## 2012-05-01 VITALS — BP 140/82 | Temp 98.3°F | Wt 274.0 lb

## 2012-05-01 DIAGNOSIS — O1002 Pre-existing essential hypertension complicating childbirth: Secondary | ICD-10-CM

## 2012-05-01 DIAGNOSIS — J069 Acute upper respiratory infection, unspecified: Secondary | ICD-10-CM

## 2012-05-01 DIAGNOSIS — O10019 Pre-existing essential hypertension complicating pregnancy, unspecified trimester: Secondary | ICD-10-CM

## 2012-05-01 MED ORDER — PENICILLIN V POTASSIUM 500 MG PO TABS: 500.0000 mg | ORAL_TABLET | Freq: Two times a day (BID) | ORAL | Status: AC

## 2012-05-01 NOTE — Patient Instructions (Signed)
Fetal Movement Counts Patient Name: __________________________________________________ Patient Due Date: ____________________ Kick counts is highly recommended in high risk pregnancies, but it is a good idea for every pregnant woman to do. Start counting fetal movements at 28 weeks of the pregnancy. Fetal movements increase after eating a full meal or eating or drinking something sweet (the blood sugar is higher). It is also important to drink plenty of fluids (well hydrated) before doing the count. Lie on your left side because it helps with the circulation or you can sit in a comfortable chair with your arms over your belly (abdomen) with no distractions around you. DOING THE COUNT  Try to do the count the same time of day each time you do it.  Mark the day and time, then see how long it takes for you to feel 10 movements (kicks, flutters, swishes, rolls). You should have at least 10 movements within 2 hours. You will most likely feel 10 movements in much less than 2 hours. If you do not, wait an hour and count again. After a couple of days you will see a pattern.  What you are looking for is a change in the pattern or not enough counts in 2 hours. Is it taking longer in time to reach 10 movements? SEEK MEDICAL CARE IF:  You feel less than 10 counts in 2 hours. Tried twice.  No movement in one hour.  The pattern is changing or taking longer each day to reach 10 counts in 2 hours.  You feel the baby is not moving as it usually does. Date: ____________ Movements: ____________ Start time: ____________ Finish time: ____________  Date: ____________ Movements: ____________ Start time: ____________ Finish time: ____________ Date: ____________ Movements: ____________ Start time: ____________ Finish time: ____________ Date: ____________ Movements: ____________ Start time: ____________ Finish time: ____________ Date: ____________ Movements: ____________ Start time: ____________ Finish time:  ____________ Date: ____________ Movements: ____________ Start time: ____________ Finish time: ____________ Date: ____________ Movements: ____________ Start time: ____________ Finish time: ____________ Date: ____________ Movements: ____________ Start time: ____________ Finish time: ____________  Date: ____________ Movements: ____________ Start time: ____________ Finish time: ____________ Date: ____________ Movements: ____________ Start time: ____________ Finish time: ____________ Date: ____________ Movements: ____________ Start time: ____________ Finish time: ____________ Date: ____________ Movements: ____________ Start time: ____________ Finish time: ____________ Date: ____________ Movements: ____________ Start time: ____________ Finish time: ____________ Date: ____________ Movements: ____________ Start time: ____________ Finish time: ____________ Date: ____________ Movements: ____________ Start time: ____________ Finish time: ____________  Date: ____________ Movements: ____________ Start time: ____________ Finish time: ____________ Date: ____________ Movements: ____________ Start time: ____________ Finish time: ____________ Date: ____________ Movements: ____________ Start time: ____________ Finish time: ____________ Date: ____________ Movements: ____________ Start time: ____________ Finish time: ____________ Date: ____________ Movements: ____________ Start time: ____________ Finish time: ____________ Date: ____________ Movements: ____________ Start time: ____________ Finish time: ____________ Date: ____________ Movements: ____________ Start time: ____________ Finish time: ____________  Date: ____________ Movements: ____________ Start time: ____________ Finish time: ____________ Date: ____________ Movements: ____________ Start time: ____________ Finish time: ____________ Date: ____________ Movements: ____________ Start time: ____________ Finish time: ____________ Date: ____________ Movements:  ____________ Start time: ____________ Finish time: ____________ Date: ____________ Movements: ____________ Start time: ____________ Finish time: ____________ Date: ____________ Movements: ____________ Start time: ____________ Finish time: ____________ Date: ____________ Movements: ____________ Start time: ____________ Finish time: ____________  Date: ____________ Movements: ____________ Start time: ____________ Finish time: ____________ Date: ____________ Movements: ____________ Start time: ____________ Finish time: ____________ Date: ____________ Movements: ____________ Start time: ____________ Finish time: ____________ Date: ____________ Movements:   ____________ Start time: ____________ Finish time: ____________ Date: ____________ Movements: ____________ Start time: ____________ Finish time: ____________ Date: ____________ Movements: ____________ Start time: ____________ Finish time: ____________ Date: ____________ Movements: ____________ Start time: ____________ Finish time: ____________  Date: ____________ Movements: ____________ Start time: ____________ Finish time: ____________ Date: ____________ Movements: ____________ Start time: ____________ Finish time: ____________ Date: ____________ Movements: ____________ Start time: ____________ Finish time: ____________ Date: ____________ Movements: ____________ Start time: ____________ Finish time: ____________ Date: ____________ Movements: ____________ Start time: ____________ Finish time: ____________ Date: ____________ Movements: ____________ Start time: ____________ Finish time: ____________ Date: ____________ Movements: ____________ Start time: ____________ Finish time: ____________  Date: ____________ Movements: ____________ Start time: ____________ Finish time: ____________ Date: ____________ Movements: ____________ Start time: ____________ Finish time: ____________ Date: ____________ Movements: ____________ Start time: ____________ Finish  time: ____________ Date: ____________ Movements: ____________ Start time: ____________ Finish time: ____________ Date: ____________ Movements: ____________ Start time: ____________ Finish time: ____________ Date: ____________ Movements: ____________ Start time: ____________ Finish time: ____________ Date: ____________ Movements: ____________ Start time: ____________ Finish time: ____________  Date: ____________ Movements: ____________ Start time: ____________ Finish time: ____________ Date: ____________ Movements: ____________ Start time: ____________ Finish time: ____________ Date: ____________ Movements: ____________ Start time: ____________ Finish time: ____________ Date: ____________ Movements: ____________ Start time: ____________ Finish time: ____________ Date: ____________ Movements: ____________ Start time: ____________ Finish time: ____________ Date: ____________ Movements: ____________ Start time: ____________ Finish time: ____________ Document Released: 05/12/2006 Document Revised: 07/05/2011 Document Reviewed: 11/12/2008 [employer] Patient Information 2013 [employer].  

## 2012-05-01 NOTE — Progress Notes (Signed)
[redacted]w[redacted]d cx 3.83 cm AFI 17.87 cm BPP 8/8 vtx pres Pt c/o congestion.  No fever or chills.  She has green sputum Physical Examination: General appearance - alert, well appearing, and in no distress Eyes - pupils equal and reactive, extraocular eye movements intact Nose - normal and patent, no erythema, discharge or polyps Mouth - mucous membranes moist, pharynx normal without lesions Neck - supple, no significant adenopathy Chest - clear to auscultation, no wheezes, rales or rhonchi, symmetric air entry Heart - normal rate and regular rhythm URI PCN RX given.   Can use mucinex, robitussin, nasal saline PRN Call if fever develops BPP and EFW @[place] 

## 2012-05-01 NOTE — Progress Notes (Signed)
Pt c/o congestion and would like antibiotic.

## 2012-05-09 ENCOUNTER — Other Ambulatory Visit: Payer: 59

## 2012-05-09 ENCOUNTER — Encounter: Payer: 59 | Admitting: *Deleted

## 2012-05-10 ENCOUNTER — Encounter: Payer: 59 | Admitting: Obstetrics and Gynecology

## 2012-05-10 ENCOUNTER — Other Ambulatory Visit: Payer: 59

## 2012-05-11 ENCOUNTER — Ambulatory Visit: Payer: 59

## 2012-05-11 ENCOUNTER — Ambulatory Visit: Payer: 59 | Admitting: Obstetrics and Gynecology

## 2012-05-11 ENCOUNTER — Other Ambulatory Visit: Payer: Self-pay | Admitting: Obstetrics and Gynecology

## 2012-05-11 ENCOUNTER — Encounter: Payer: Self-pay | Admitting: Obstetrics and Gynecology

## 2012-05-11 VITALS — BP 138/88 | Wt 277.0 lb

## 2012-05-11 DIAGNOSIS — Z331 Pregnant state, incidental: Secondary | ICD-10-CM

## 2012-05-11 DIAGNOSIS — J069 Acute upper respiratory infection, unspecified: Secondary | ICD-10-CM

## 2012-05-11 DIAGNOSIS — I10 Essential (primary) hypertension: Secondary | ICD-10-CM

## 2012-05-11 NOTE — Progress Notes (Signed)
Pt c/o abdominal pressure.  GBS today Pt thinks she has yeast infection due to antibiotic she is taking.

## 2012-05-11 NOTE — Progress Notes (Signed)
[redacted]w[redacted]d Beta strep sent. The patient complains of vaginal discomfort.  Wet prep negative. Ultrasound: Single gestation, normal fluid, vertex, 6 lbs. 5 oz. (52nd percentile), biophysical profile 8 out of 8. Patient was a repeat cesarean section at [redacted] weeks gestation. The patient is completing penicillin for a sinus infection. Return to office in 1 week. Repeat biophysical profile next week because of a diagnosis of hypertension. Dr. Stefano Gaul

## 2012-05-12 LAB — US OB FOLLOW UP

## 2012-05-15 ENCOUNTER — Telehealth: Payer: Self-pay

## 2012-05-15 ENCOUNTER — Ambulatory Visit: Payer: 59 | Admitting: Obstetrics and Gynecology

## 2012-05-15 VITALS — BP 150/82 | Wt 278.0 lb

## 2012-05-15 DIAGNOSIS — Z3490 Encounter for supervision of normal pregnancy, unspecified, unspecified trimester: Secondary | ICD-10-CM

## 2012-05-15 NOTE — Progress Notes (Signed)
[redacted]w[redacted]d Pt in today ? ROM Feeling leaking on and off since yesterday. No bleeding or cramping. + FM -- x3 this morning. No pooling, ferning, nitrazine--neg Wet prep--neg Cervix -- Closed, thick, -2, vertex -- vag dc thin  Discussed BP 150/82--continue taking Aldomet 250mg  QD (taking in am) and keep scheduled appt on 1/23 with Dr Pennie Rushing and for [employer].

## 2012-05-15 NOTE — Progress Notes (Signed)
Pt is here today for leaking of fluid . Started yesterday pt wants a cervix check today. Pt stated no other issues.

## 2012-05-15 NOTE — Telephone Encounter (Signed)
Tc from pt. Pt with watery leakage x 2 days. No vaginal bldg. Pos FM. ? Contractions. Pt c/o vag pressure. Appt sched today for eval with [facility]. Pt agrees.

## 2012-05-18 ENCOUNTER — Ambulatory Visit: Payer: 59 | Admitting: Obstetrics and Gynecology

## 2012-05-18 ENCOUNTER — Ambulatory Visit: Payer: 59

## 2012-05-18 VITALS — BP 132/64 | Wt 280.0 lb

## 2012-05-18 DIAGNOSIS — I10 Essential (primary) hypertension: Secondary | ICD-10-CM

## 2012-05-18 DIAGNOSIS — Z98891 History of uterine scar from previous surgery: Secondary | ICD-10-CM

## 2012-05-18 NOTE — Progress Notes (Signed)
[redacted]w[redacted]d No complaints.

## 2012-05-18 NOTE — Progress Notes (Signed)
Ultrasound shows:  SIUP  S=D     [place] EDD: 06/07/2012           AFI: 14.5 cm = 55th%           Cervical length: not measured           Placenta localization: posterior           Fetal presentation: vertex Ultrasound Comments: Score BPP 8/8 in 19 minutes. Reviewed c/s procedures

## 2012-05-22 ENCOUNTER — Other Ambulatory Visit: Payer: Self-pay

## 2012-05-22 DIAGNOSIS — O10019 Pre-existing essential hypertension complicating pregnancy, unspecified trimester: Secondary | ICD-10-CM

## 2012-05-23 ENCOUNTER — Encounter (HOSPITAL_COMMUNITY): Payer: Self-pay | Admitting: Pharmacist

## 2012-05-25 ENCOUNTER — Ambulatory Visit: Payer: 59

## 2012-05-25 ENCOUNTER — Ambulatory Visit: Payer: 59 | Admitting: Obstetrics and Gynecology

## 2012-05-25 ENCOUNTER — Encounter: Payer: Self-pay | Admitting: Obstetrics and Gynecology

## 2012-05-25 VITALS — BP 144/84 | Wt 279.0 lb

## 2012-05-25 DIAGNOSIS — O10019 Pre-existing essential hypertension complicating pregnancy, unspecified trimester: Secondary | ICD-10-CM

## 2012-05-25 DIAGNOSIS — O169 Unspecified maternal hypertension, unspecified trimester: Secondary | ICD-10-CM

## 2012-05-25 NOTE — Progress Notes (Signed)
[redacted]w[redacted]d Ultrasound: Single gestation, vertex, normal fluid, BPP 8 out of 8. The patient is scheduled for repeat cesarean section and tubal ligation in 6 days. Dr. Stefano Gaul

## 2012-05-25 NOTE — Progress Notes (Signed)
[redacted]w[redacted]d Pt wants cervix checked  BPP done today

## 2012-05-29 ENCOUNTER — Encounter (HOSPITAL_COMMUNITY): Payer: Self-pay | Admitting: *Deleted

## 2012-05-29 ENCOUNTER — Other Ambulatory Visit (HOSPITAL_COMMUNITY): Payer: Self-pay

## 2012-05-29 ENCOUNTER — Telehealth: Payer: Self-pay | Admitting: Obstetrics and Gynecology

## 2012-05-29 ENCOUNTER — Encounter (HOSPITAL_COMMUNITY)
Admission: RE | Admit: 2012-05-29 | Discharge: 2012-05-29 | Disposition: A | Discharge status: Home / Self Care | Payer: 59 | Source: Ambulatory Visit | Attending: Obstetrics and Gynecology | Admitting: Obstetrics and Gynecology

## 2012-05-29 ENCOUNTER — Inpatient Hospital Stay (HOSPITAL_COMMUNITY)
Admission: AD | Admit: 2012-05-29 | Discharge: 2012-05-29 | Disposition: A | Discharge status: Home / Self Care | Payer: 59 | Source: Ambulatory Visit | Attending: Obstetrics and Gynecology | Admitting: Obstetrics and Gynecology

## 2012-05-29 ENCOUNTER — Encounter (HOSPITAL_COMMUNITY): Payer: Self-pay

## 2012-05-29 DIAGNOSIS — O10019 Pre-existing essential hypertension complicating pregnancy, unspecified trimester: Secondary | ICD-10-CM | POA: Insufficient documentation

## 2012-05-29 DIAGNOSIS — O133 Gestational [pregnancy-induced] hypertension without significant proteinuria, third trimester: Secondary | ICD-10-CM

## 2012-05-29 HISTORY — DX: Anemia, unspecified: D64.9

## 2012-05-29 HISTORY — DX: Gastro-esophageal reflux disease without esophagitis: K21.9

## 2012-05-29 LAB — URINE MICROSCOPIC-ADD ON

## 2012-05-29 LAB — TYPE AND SCREEN
ABO/RH(D): B POS
Antibody Screen: NEGATIVE

## 2012-05-29 LAB — COMPREHENSIVE METABOLIC PANEL
Albumin: 2.4 g/dL — ABNORMAL LOW (ref 3.5–5.2)
Alkaline Phosphatase: 150 U/L — ABNORMAL HIGH (ref 39–117)
BUN: 6 mg/dL (ref 6–23)
Creatinine, Ser: 0.53 mg/dL (ref 0.50–1.10)
Potassium: 3.4 mEq/L — ABNORMAL LOW (ref 3.5–5.1)
Total Protein: 6.8 g/dL (ref 6.0–8.3)

## 2012-05-29 LAB — URIC ACID: Uric Acid, Serum: 3.6 mg/dL (ref 2.4–7.0)

## 2012-05-29 LAB — URINALYSIS, ROUTINE W REFLEX MICROSCOPIC
Leukocytes, UA: NEGATIVE
Nitrite: NEGATIVE
Specific Gravity, Urine: 1.02 (ref 1.005–1.030)
pH: 7 (ref 5.0–8.0)

## 2012-05-29 LAB — CBC
HCT: 32.7 % — ABNORMAL LOW (ref 36.0–46.0)
MCHC: 33.3 g/dL (ref 30.0–36.0)
RDW: 15.2 % (ref 11.5–15.5)

## 2012-05-29 LAB — SURGICAL PCR SCREEN: Staphylococcus aureus: POSITIVE — AB

## 2012-05-29 MED ORDER — METHYLDOPA 250 MG PO TABS: ORAL_TABLET | ORAL | Status: DC

## 2012-05-29 MED ORDER — METHYLDOPA 250 MG PO TABS
250.0000 mg | ORAL_TABLET | Freq: Once | ORAL | Status: AC
  Administered 2012-05-29: 250 mg via ORAL
  Filled 2012-05-29: qty 1

## 2012-05-29 NOTE — MAU Provider Note (Signed)
Chief Complaint:  Hypertension  First Provider Initiated Contact with Patient 05/29/12 2300     HPI: Michelle Wilson is a 37 y.o. [id number] at [redacted]w[redacted]d who was sent to maternity admissions from Pre-op for elevated BP.  In antenatal testing for [facility] on aldomet.   Past Medical History: Past Medical History  Diagnosis Date  . Hypertension     Aldomet 250 mg Q day  . Infection     Yeast inf; no frequent  . IBS (irritable bowel syndrome)     no issues currently  . Anemia   . GERD (gastroesophageal reflux disease)     pregnancy    Past obstetric history: OB History    Grav Para Term Preterm Abortions TAB SAB Ect Mult Living   5 1 1  3  3   1      # Outc Date GA Lbr Len/2nd Wgt Sex Del Anes PTL Lv   1 TRM 7/11 [redacted]w[redacted]d  7lb2oz(3.232kg) M CS   Yes   Comments: No complications   2 SAB  [redacted]w[redacted]d          Comments: passed naturally   3 SAB  [redacted]w[redacted]d          Comments: passed naturally   4 SAB  [redacted]w[redacted]d          Comments: passed naturally   5 CUR               Past Surgical History: Past Surgical History  Procedure Date  . Breast reduction surgery 1997  . Cesarean section 2011    Family History: Family History  Problem Relation Age of Onset  . Hypertension Mother   . Hypertension Father   . Hypertension Maternal Grandmother   . Hypertension Maternal Grandfather   . Hypertension Paternal Grandmother   . Hypertension Paternal Grandfather   . Hyperthyroidism Maternal Aunt     x 3  . Stroke Maternal Grandfather   . Schizophrenia Maternal Aunt   . Cancer Maternal Uncle     Lung deceased    Social History: History  Substance Use Topics  . Smoking status: Never Smoker   . Smokeless tobacco: Never Used  . Alcohol Use: Yes     Comment: Occasionally, not while pregnant    Allergies: No Known Allergies  Meds:  Prescriptions prior to admission  Medication Sig Dispense Refill  . fluticasone (FLONASE) 50 MCG/ACT nasal spray Place 1 spray into the nose daily as needed. For nasal  congestion      . Prenatal Vit-Fe Sulfate-FA (PRENATAL VITAMIN PO) Take 1 tablet by mouth daily.      . [DISCONTINUED] methyldopa (ALDOMET) 250 MG tablet Take 250 mg by mouth daily.        ROS: Denies HA, vision changes, epigastric pain, contractions, leakage of fluid or vaginal bleeding. Good fetal movement.   Physical Exam  Blood pressure 160/88, pulse 93, temperature 98.7 F (37.1 C), temperature source Oral, resp. rate 20, last menstrual period 09/01/2011, SpO2 100.00%. Patient Vitals for the past 24 hrs:  BP Temp Temp src Pulse Resp SpO2  05/29/12 2000 160/88 mmHg - - - - -  05/29/12 1933 150/80 mmHg - - - - -  05/29/12 1915 160/88 mmHg - - - - -  05/29/12 1810 158/80 mmHg - - 93  - -  05/29/12 1703 152/86 mmHg - - 92  - -  05/29/12 1640 160/88 mmHg - - 109  - -  05/29/12 1608 197/92 mmHg 98.7 F (37.1 C)  Oral 114  20  100 %    GENERAL: Well-developed, well-nourished female in no acute distress.  HEENT: normocephalic HEART: normal rate RESP: normal effort ABDOMEN: Soft, non-tender, gravid appropriate for gestational age EXTREMITIES: Nontender, no edema NEURO: alert and oriented SPECULUM EXAM: NEFG, physiologic discharge, no blood, cervix clean    FHT:  Baseline 130-140 , moderate variability, accelerations present, no decelerations Contractions: UI   Labs: Results for orders placed during the hospital encounter of 05/29/12 (from the past 24 hour(s))  URINALYSIS, ROUTINE W REFLEX MICROSCOPIC     Status: Abnormal   Collection Time   05/29/12  4:15 PM      Component Value Range   Color, Urine YELLOW  YELLOW   APPearance HAZY (*) CLEAR   Specific Gravity, Urine 1.020  1.005 - 1.030   pH 7.0  5.0 - 8.0   Glucose, UA NEGATIVE  NEGATIVE mg/dL   Hgb urine dipstick NEGATIVE  NEGATIVE   Bilirubin Urine NEGATIVE  NEGATIVE   Ketones, ur NEGATIVE  NEGATIVE mg/dL   Protein, ur 30 (*) NEGATIVE mg/dL   Urobilinogen, UA 0.2  0.0 - 1.0 mg/dL   Nitrite NEGATIVE  NEGATIVE    Leukocytes, UA NEGATIVE  NEGATIVE  URINE MICROSCOPIC-ADD ON     Status: Abnormal   Collection Time   05/29/12  4:15 PM      Component Value Range   Squamous Epithelial / LPF MANY (*) RARE   WBC, UA 11-20  <3 WBC/hpf   RBC / HPF 7-10  <3 RBC/hpf   Bacteria, UA MANY (*) RARE   Urine-Other MUCOUS PRESENT    CBC     Status: Abnormal   Collection Time   05/29/12  5:23 PM      Component Value Range   WBC 7.8  4.0 - 10.5 K/uL   RBC 3.85 (*) 3.87 - 5.11 MIL/uL   Hemoglobin 10.9 (*) 12.0 - 15.0 g/dL   HCT 82.9 (*) 56.2 - 13.0 %   MCV 84.9  78.0 - 100.0 fL   MCH 28.3  26.0 - 34.0 pg   MCHC 33.3  30.0 - 36.0 g/dL   RDW 86.5  78.4 - 69.6 %   Platelets 194  150 - 400 K/uL  COMPREHENSIVE METABOLIC PANEL     Status: Abnormal   Collection Time   05/29/12  5:23 PM      Component Value Range   Sodium 133 (*) 135 - 145 mEq/L   Potassium 3.4 (*) 3.5 - 5.1 mEq/L   Chloride 100  96 - 112 mEq/L   CO2 22  19 - 32 mEq/L   Glucose, Bld 113 (*) 70 - 99 mg/dL   BUN 6  6 - 23 mg/dL   Creatinine, Ser 2.95  0.50 - 1.10 mg/dL   Calcium 9.1  8.4 - 28.4 mg/dL   Total Protein 6.8  6.0 - 8.3 g/dL   Albumin 2.4 (*) 3.5 - 5.2 g/dL   AST 13  0 - 37 U/L   ALT 14  0 - 35 U/L   Alkaline Phosphatase 150 (*) 39 - 117 U/L   Total Bilirubin 0.2 (*) 0.3 - 1.2 mg/dL   GFR calc non Af Amer >90  >90 mL/min   GFR calc Af Amer >90  >90 mL/min  URIC ACID     Status: Normal   Collection Time   05/29/12  5:23 PM      Component Value Range   Uric Acid, Serum 3.6  2.4 -  7.0 mg/dL    Imaging:  No results found. MAU Course: 1845: Aldomet 250 mg PO x 1 per Dr. Estanislado Pandy. Labs normal except for 30 mg of protein on UA. 2000: Reviewed BPs w/ Dr. Estanislado Pandy. D/C home w/ 24 hour urine. Increase Aldomet to 500 mg QAM, 250 mg QHS.   Assessment: 1. Gestational hypertension without significant proteinuria in third trimester    Plan: Discharge home per consult w/ Dr. Estanislado Pandy.  Bring 24 hour urine to [facility] when admitted for C/S 05/31/12.  Labor  and PIH precautions and fetal kick counts Follow-up Information    Follow up with [facility] On 05/31/2012 for planned C/S.   Contact information:   [address] [place] [address]       Follow up with [facility] [place] [facility]ERN[facility] ADMISS[facility]. (As needed if symptoms worsen)    Contact information:   [address] [place] [address] [phone]          Medication List     As of 05/29/2012  8:03 PM    TAKE these medications         fluticasone 50 MCG/ACT nasal spray   Commonly known as: FLONASE   Place 1 spray into the nose daily as needed. For nasal congestion      methyldopa 250 MG tablet   Commonly known as: ALDOMET   500 mg (2 tablets) every morning and 250 mg (1 tablet) every evening.      PRENATAL VITAMIN PO   Take 1 tablet by mouth daily.        [place], [place] 05/29/2012 8:03 PM

## 2012-05-29 NOTE — Pre-Procedure Instructions (Signed)
Dr. Pennie Rushing notified of pt's high BP readings- 160/100 to 220/120. Pt taken to MAU after PAT appt per Dr. Lilian Coma request. Dr Malen Gauze also notified.

## 2012-05-29 NOTE — Patient Instructions (Addendum)
Your procedure is scheduled on:05/31/12  Enter through the Main Entrance at :8am Pick up desk phone and dial [phone] and inform us of your arrival.  Please call [phone] if you have any problems the morning of surgery.  Remember: Do not eat or drink after midnight:TUESDAY   Take these meds the morning of surgery with a sip of water:BP med  DO NOT wear jewelry, eye make-up, lipstick,body lotion, or dark fingernail polish. Do not shave for 48 hours prior to surgery.  If you are to be admitted after surgery, leave suitcase in car until your room has been assigned. Patients discharged on the day of surgery will not be allowed to drive home.

## 2012-05-29 NOTE — MAU Note (Signed)
Pt will be sent home with a 24 hr urine jug and instructions to collect a 24 hr urine.

## 2012-05-29 NOTE — MAU Note (Signed)
Patient states she was being seen in  Pre Op and sent to MAU for elevated blood pressure. Scheduled for a repeat cesarean section. Patient denies any pain, bleeding leaking or contractions. Reports good fetal movement.

## 2012-05-30 LAB — URINE CULTURE

## 2012-05-30 LAB — ABO/RH: ABO/RH(D): B POS

## 2012-05-30 MED ORDER — DEXTROSE 5 % IV SOLN
3.0000 g | INTRAVENOUS | Status: AC
  Administered 2012-05-31: 3 g via INTRAVENOUS
  Filled 2012-05-30: qty 3000

## 2012-05-31 ENCOUNTER — Encounter (HOSPITAL_COMMUNITY): Payer: Self-pay | Admitting: Surgery

## 2012-05-31 ENCOUNTER — Encounter (HOSPITAL_COMMUNITY): Payer: Self-pay | Admitting: Anesthesiology

## 2012-05-31 ENCOUNTER — Encounter (HOSPITAL_COMMUNITY)
Admission: AD | Disposition: A | Discharge status: Home / Self Care | Payer: Self-pay | Source: Ambulatory Visit | Attending: Obstetrics and Gynecology

## 2012-05-31 ENCOUNTER — Inpatient Hospital Stay (HOSPITAL_COMMUNITY): Payer: 59 | Admitting: Anesthesiology

## 2012-05-31 ENCOUNTER — Inpatient Hospital Stay (HOSPITAL_COMMUNITY)
Admission: AD | Admit: 2012-05-31 | Discharge: 2012-06-03 | DRG: 765 | Disposition: A | Discharge status: Home / Self Care | Payer: 59 | Source: Ambulatory Visit | Attending: Obstetrics and Gynecology | Admitting: Obstetrics and Gynecology

## 2012-05-31 DIAGNOSIS — Z98891 History of uterine scar from previous surgery: Secondary | ICD-10-CM | POA: Diagnosis not present

## 2012-05-31 DIAGNOSIS — Z22330 Carrier of Group B streptococcus: Secondary | ICD-10-CM

## 2012-05-31 DIAGNOSIS — D649 Anemia, unspecified: Secondary | ICD-10-CM | POA: Diagnosis not present

## 2012-05-31 DIAGNOSIS — E669 Obesity, unspecified: Secondary | ICD-10-CM | POA: Diagnosis present

## 2012-05-31 DIAGNOSIS — O34219 Maternal care for unspecified type scar from previous cesarean delivery: Principal | ICD-10-CM | POA: Diagnosis present

## 2012-05-31 DIAGNOSIS — F419 Anxiety disorder, unspecified: Secondary | ICD-10-CM | POA: Diagnosis present

## 2012-05-31 DIAGNOSIS — Z302 Encounter for sterilization: Secondary | ICD-10-CM

## 2012-05-31 DIAGNOSIS — O1002 Pre-existing essential hypertension complicating childbirth: Secondary | ICD-10-CM | POA: Diagnosis present

## 2012-05-31 DIAGNOSIS — Z6835 Body mass index (BMI) 35.0-35.9, adult: Secondary | ICD-10-CM | POA: Diagnosis present

## 2012-05-31 DIAGNOSIS — I10 Essential (primary) hypertension: Secondary | ICD-10-CM | POA: Diagnosis present

## 2012-05-31 DIAGNOSIS — O9903 Anemia complicating the puerperium: Secondary | ICD-10-CM | POA: Diagnosis not present

## 2012-05-31 DIAGNOSIS — O99892 Other specified diseases and conditions complicating childbirth: Secondary | ICD-10-CM | POA: Diagnosis present

## 2012-05-31 DIAGNOSIS — O133 Gestational [pregnancy-induced] hypertension without significant proteinuria, third trimester: Secondary | ICD-10-CM

## 2012-05-31 HISTORY — PX: BILATERAL SALPINGECTOMY: SHX5743

## 2012-05-31 SURGERY — SALPINGECTOMY, BILATERAL, OPEN
Anesthesia: Spinal | Site: Abdomen | Wound class: Clean Contaminated

## 2012-05-31 MED ORDER — DIPHENHYDRAMINE HCL 50 MG/ML IJ SOLN: 25.0000 mg | INTRAMUSCULAR | Status: DC | PRN

## 2012-05-31 MED ORDER — BUPIVACAINE HCL (PF) 0.25 % IJ SOLN
INTRAMUSCULAR | Status: AC
  Filled 2012-05-31: qty 30

## 2012-05-31 MED ORDER — OXYTOCIN 40 UNITS IN LACTATED RINGERS INFUSION - SIMPLE MED: 62.5000 mL/h | INTRAVENOUS | Status: AC

## 2012-05-31 MED ORDER — ZOLPIDEM TARTRATE 5 MG PO TABS: 5.0000 mg | ORAL_TABLET | Freq: Every evening | ORAL | Status: DC | PRN

## 2012-05-31 MED ORDER — TETANUS-DIPHTH-ACELL PERTUSSIS 5-2.5-18.5 LF-MCG/0.5 IM SUSP
0.5000 mL | Freq: Once | INTRAMUSCULAR | Status: AC
  Administered 2012-06-01: 0.5 mL via INTRAMUSCULAR

## 2012-05-31 MED ORDER — LANOLIN HYDROUS EX OINT
1.0000 | TOPICAL_OINTMENT | CUTANEOUS | Status: DC | PRN
Start: 2012-05-31 — End: 2012-06-03

## 2012-05-31 MED ORDER — DIPHENHYDRAMINE HCL 25 MG PO CAPS
25.0000 mg | ORAL_CAPSULE | ORAL | Status: DC | PRN
  Filled 2012-05-31: qty 1

## 2012-05-31 MED ORDER — SENNOSIDES-DOCUSATE SODIUM 8.6-50 MG PO TABS
2.0000 | ORAL_TABLET | Freq: Every day | ORAL | Status: DC
  Administered 2012-05-31 – 2012-06-02 (×3): 2 via ORAL

## 2012-05-31 MED ORDER — ONDANSETRON HCL 4 MG/2ML IJ SOLN
INTRAMUSCULAR | Status: DC | PRN
  Administered 2012-05-31: 4 mg via INTRAVENOUS

## 2012-05-31 MED ORDER — MENTHOL 3 MG MT LOZG: 1.0000 | LOZENGE | OROMUCOSAL | Status: DC | PRN

## 2012-05-31 MED ORDER — NALBUPHINE SYRINGE 5 MG/0.5 ML
5.0000 mg | INJECTION | INTRAMUSCULAR | Status: DC | PRN
  Filled 2012-05-31: qty 1

## 2012-05-31 MED ORDER — HYDROMORPHONE HCL PF 1 MG/ML IJ SOLN
INTRAMUSCULAR | Status: DC | PRN
  Administered 2012-05-31: 1 mg via INTRAVENOUS

## 2012-05-31 MED ORDER — MORPHINE SULFATE 0.5 MG/ML IJ SOLN
INTRAMUSCULAR | Status: AC
  Filled 2012-05-31: qty 10

## 2012-05-31 MED ORDER — LACTATED RINGERS IV SOLN
INTRAVENOUS | Status: DC
  Administered 2012-05-31 (×3): via INTRAVENOUS

## 2012-05-31 MED ORDER — LABETALOL HCL 5 MG/ML IV SOLN
INTRAVENOUS | Status: AC
  Filled 2012-05-31: qty 4

## 2012-05-31 MED ORDER — ONDANSETRON HCL 4 MG/2ML IJ SOLN
INTRAMUSCULAR | Status: AC
  Filled 2012-05-31: qty 2

## 2012-05-31 MED ORDER — KETOROLAC TROMETHAMINE 30 MG/ML IJ SOLN
INTRAMUSCULAR | Status: AC
  Administered 2012-05-31: 30 mg via INTRAMUSCULAR
  Filled 2012-05-31: qty 1

## 2012-05-31 MED ORDER — FENTANYL CITRATE 0.05 MG/ML IJ SOLN
INTRAMUSCULAR | Status: AC
  Filled 2012-05-31: qty 2

## 2012-05-31 MED ORDER — OXYTOCIN 10 UNIT/ML IJ SOLN
40.0000 [IU] | INTRAVENOUS | Status: DC | PRN
  Administered 2012-05-31: 40 [IU] via INTRAVENOUS

## 2012-05-31 MED ORDER — SCOPOLAMINE 1 MG/3DAYS TD PT72
MEDICATED_PATCH | TRANSDERMAL | Status: AC
  Administered 2012-05-31: 1.5 mg via TRANSDERMAL
  Filled 2012-05-31: qty 1

## 2012-05-31 MED ORDER — PRENATAL MULTIVITAMIN CH
1.0000 | ORAL_TABLET | Freq: Every day | ORAL | Status: DC
  Administered 2012-06-01 – 2012-06-03 (×3): 1 via ORAL
  Filled 2012-05-31 (×3): qty 1

## 2012-05-31 MED ORDER — 0.9 % SODIUM CHLORIDE (POUR BTL) OPTIME
TOPICAL | Status: DC | PRN
  Administered 2012-05-31 (×3): 1000 mL

## 2012-05-31 MED ORDER — OXYTOCIN 10 UNIT/ML IJ SOLN
INTRAMUSCULAR | Status: AC
  Filled 2012-05-31: qty 4

## 2012-05-31 MED ORDER — OXYCODONE-ACETAMINOPHEN 5-325 MG PO TABS
1.0000 | ORAL_TABLET | ORAL | Status: DC | PRN
  Administered 2012-06-01 (×2): 2 via ORAL
  Administered 2012-06-01 (×2): 1 via ORAL
  Administered 2012-06-02 (×3): 2 via ORAL
  Administered 2012-06-03: 1 via ORAL
  Filled 2012-05-31: qty 1
  Filled 2012-05-31 (×3): qty 2
  Filled 2012-05-31 (×3): qty 1
  Filled 2012-05-31: qty 2

## 2012-05-31 MED ORDER — MORPHINE SULFATE (PF) 0.5 MG/ML IJ SOLN
INTRAMUSCULAR | Status: DC | PRN
  Administered 2012-05-31: 4.85 mg via INTRAVENOUS
  Administered 2012-05-31: .15 mg via EPIDURAL

## 2012-05-31 MED ORDER — METHYLDOPA 500 MG PO TABS
500.0000 mg | ORAL_TABLET | Freq: Two times a day (BID) | ORAL | Status: DC
  Administered 2012-06-01 – 2012-06-03 (×5): 500 mg via ORAL
  Filled 2012-05-31 (×7): qty 1

## 2012-05-31 MED ORDER — SCOPOLAMINE 1 MG/3DAYS TD PT72
1.0000 | MEDICATED_PATCH | Freq: Once | TRANSDERMAL | Status: DC
  Administered 2012-05-31: 1.5 mg via TRANSDERMAL

## 2012-05-31 MED ORDER — SODIUM CHLORIDE 0.9 % IJ SOLN: 3.0000 mL | INTRAMUSCULAR | Status: DC | PRN

## 2012-05-31 MED ORDER — HYDROMORPHONE HCL PF 1 MG/ML IJ SOLN
INTRAMUSCULAR | Status: AC
  Filled 2012-05-31: qty 1

## 2012-05-31 MED ORDER — SIMETHICONE 80 MG PO CHEW: 80.0000 mg | CHEWABLE_TABLET | ORAL | Status: DC | PRN

## 2012-05-31 MED ORDER — BUPIVACAINE IN DEXTROSE 0.75-8.25 % IT SOLN
INTRATHECAL | Status: DC | PRN
  Administered 2012-05-31: 1.7 mL via INTRATHECAL

## 2012-05-31 MED ORDER — KETOROLAC TROMETHAMINE 30 MG/ML IJ SOLN
30.0000 mg | Freq: Four times a day (QID) | INTRAMUSCULAR | Status: AC | PRN
  Administered 2012-05-31: 30 mg via INTRAMUSCULAR

## 2012-05-31 MED ORDER — MUPIROCIN 2 % EX OINT
TOPICAL_OINTMENT | Freq: Two times a day (BID) | CUTANEOUS | Status: DC
  Administered 2012-05-31 – 2012-06-03 (×7): via NASAL
  Filled 2012-05-31: qty 22

## 2012-05-31 MED ORDER — PHENYLEPHRINE HCL 10 MG/ML IJ SOLN
INTRAMUSCULAR | Status: DC | PRN
  Administered 2012-05-31: 40 ug via INTRAVENOUS

## 2012-05-31 MED ORDER — METOCLOPRAMIDE HCL 5 MG/ML IJ SOLN: 10.0000 mg | Freq: Three times a day (TID) | INTRAMUSCULAR | Status: DC | PRN

## 2012-05-31 MED ORDER — DIBUCAINE 1 % RE OINT: 1.0000 "application " | TOPICAL_OINTMENT | RECTAL | Status: DC | PRN

## 2012-05-31 MED ORDER — ONDANSETRON HCL 4 MG/2ML IJ SOLN: 4.0000 mg | Freq: Three times a day (TID) | INTRAMUSCULAR | Status: DC | PRN

## 2012-05-31 MED ORDER — NALOXONE HCL 1 MG/ML IJ SOLN
1.0000 ug/kg/h | INTRAVENOUS | Status: DC | PRN
  Filled 2012-05-31: qty 2

## 2012-05-31 MED ORDER — FENTANYL CITRATE 0.05 MG/ML IJ SOLN: 25.0000 ug | INTRAMUSCULAR | Status: DC | PRN

## 2012-05-31 MED ORDER — BUPIVACAINE HCL (PF) 0.25 % IJ SOLN
INTRAMUSCULAR | Status: DC | PRN
  Administered 2012-05-31: 30 mL

## 2012-05-31 MED ORDER — PHENYLEPHRINE 40 MCG/ML (10ML) SYRINGE FOR IV PUSH (FOR BLOOD PRESSURE SUPPORT)
PREFILLED_SYRINGE | INTRAVENOUS | Status: AC
  Filled 2012-05-31: qty 5

## 2012-05-31 MED ORDER — SIMETHICONE 80 MG PO CHEW
80.0000 mg | CHEWABLE_TABLET | Freq: Three times a day (TID) | ORAL | Status: DC
  Administered 2012-05-31 – 2012-06-03 (×10): 80 mg via ORAL

## 2012-05-31 MED ORDER — LABETALOL HCL 5 MG/ML IV SOLN
INTRAVENOUS | Status: DC | PRN
  Administered 2012-05-31: 10 mg via INTRAVENOUS

## 2012-05-31 MED ORDER — KETOROLAC TROMETHAMINE 30 MG/ML IJ SOLN: 30.0000 mg | Freq: Four times a day (QID) | INTRAMUSCULAR | Status: AC | PRN

## 2012-05-31 MED ORDER — FENTANYL CITRATE 0.05 MG/ML IJ SOLN
INTRAMUSCULAR | Status: DC | PRN
  Administered 2012-05-31 (×2): 25 ug via INTRAVENOUS
  Administered 2012-05-31: 25 ug via INTRATHECAL
  Administered 2012-05-31: 25 ug via INTRAVENOUS

## 2012-05-31 MED ORDER — IBUPROFEN 600 MG PO TABS
600.0000 mg | ORAL_TABLET | Freq: Four times a day (QID) | ORAL | Status: DC
  Administered 2012-06-01 – 2012-06-03 (×9): 600 mg via ORAL
  Filled 2012-05-31 (×2): qty 1

## 2012-05-31 MED ORDER — NALOXONE HCL 0.4 MG/ML IJ SOLN: 0.4000 mg | INTRAMUSCULAR | Status: DC | PRN

## 2012-05-31 MED ORDER — IBUPROFEN 600 MG PO TABS
600.0000 mg | ORAL_TABLET | Freq: Four times a day (QID) | ORAL | Status: DC | PRN
  Filled 2012-05-31 (×8): qty 1

## 2012-05-31 MED ORDER — LACTATED RINGERS IV SOLN
INTRAVENOUS | Status: DC
  Administered 2012-05-31: 18:00:00 via INTRAVENOUS

## 2012-05-31 MED ORDER — DIPHENHYDRAMINE HCL 25 MG PO CAPS: 25.0000 mg | ORAL_CAPSULE | Freq: Four times a day (QID) | ORAL | Status: DC | PRN

## 2012-05-31 MED ORDER — CEFAZOLIN SODIUM-DEXTROSE 2-3 GM-% IV SOLR
INTRAVENOUS | Status: AC
  Filled 2012-05-31: qty 50

## 2012-05-31 MED ORDER — DIPHENHYDRAMINE HCL 50 MG/ML IJ SOLN: 12.5000 mg | INTRAMUSCULAR | Status: DC | PRN

## 2012-05-31 MED ORDER — MEPERIDINE HCL 25 MG/ML IJ SOLN: 6.2500 mg | INTRAMUSCULAR | Status: DC | PRN

## 2012-05-31 MED ORDER — ONDANSETRON HCL 4 MG PO TABS: 4.0000 mg | ORAL_TABLET | ORAL | Status: DC | PRN

## 2012-05-31 MED ORDER — ONDANSETRON HCL 4 MG/2ML IJ SOLN: 4.0000 mg | INTRAMUSCULAR | Status: DC | PRN

## 2012-05-31 MED ORDER — WITCH HAZEL-GLYCERIN EX PADS: 1.0000 "application " | MEDICATED_PAD | CUTANEOUS | Status: DC | PRN

## 2012-05-31 SURGICAL SUPPLY — 54 items
ADH SKN CLS APL DERMABOND .7 (GAUZE/BANDAGES/DRESSINGS)
APL SKNCLS STERI-STRIP NONHPOA (GAUZE/BANDAGES/DRESSINGS)
BENZOIN TINCTURE PRP APPL 2/3 (GAUZE/BANDAGES/DRESSINGS) IMPLANT
BLADE EXTENDED COATED 6.5IN (ELECTRODE) IMPLANT
BLADE HEX COATED 2.75 (ELECTRODE) IMPLANT
BOOTIES KNEE HIGH SLOAN (MISCELLANEOUS) ×8 IMPLANT
CLOTH BEACON ORANGE TIMEOUT ST (SAFETY) ×4 IMPLANT
CONTAINER PREFILL 10% NBF 15ML (MISCELLANEOUS) ×8 IMPLANT
COVER LIGHT HANDLE  1/PK (MISCELLANEOUS) ×1
COVER LIGHT HANDLE 1/PK (MISCELLANEOUS) ×1 IMPLANT
DERMABOND ADVANCED (GAUZE/BANDAGES/DRESSINGS)
DERMABOND ADVANCED .7 DNX12 (GAUZE/BANDAGES/DRESSINGS) IMPLANT
DRAIN JACKSON PRT FLT 7MM (DRAIN) ×2 IMPLANT
DRAPE LG THREE QUARTER DISP (DRAPES) ×4 IMPLANT
DRESSING TELFA 8X3 (GAUZE/BANDAGES/DRESSINGS) IMPLANT
DRSG OPSITE POSTOP 4X10 (GAUZE/BANDAGES/DRESSINGS) ×4 IMPLANT
DURAPREP 26ML APPLICATOR (WOUND CARE) ×4 IMPLANT
ELECT REM PT RETURN 9FT ADLT (ELECTROSURGICAL) ×4
ELECTRODE REM PT RTRN 9FT ADLT (ELECTROSURGICAL) ×3 IMPLANT
EVACUATOR SILICONE 100CC (DRAIN) ×2 IMPLANT
EXTRACTOR VACUUM KIWI (MISCELLANEOUS) IMPLANT
EXTRACTOR VACUUM M CUP 4 TUBE (SUCTIONS) IMPLANT
GAUZE SPONGE 4X4 12PLY STRL LF (GAUZE/BANDAGES/DRESSINGS) ×6 IMPLANT
GLOVE SURG SS PI 6.5 STRL IVOR (GLOVE) ×8 IMPLANT
GOWN PREVENTION PLUS LG XLONG (DISPOSABLE) ×12 IMPLANT
KIT ABG SYR 3ML LUER SLIP (SYRINGE) IMPLANT
NDL HYPO 25X5/8 SAFETYGLIDE (NEEDLE) ×2 IMPLANT
NDL SPNL 22GX3.5 QUINCKE BK (NEEDLE) ×2 IMPLANT
NEEDLE HYPO 25X5/8 SAFETYGLIDE (NEEDLE) ×4 IMPLANT
NEEDLE SPNL 22GX3.5 QUINCKE BK (NEEDLE) ×4 IMPLANT
NS IRRIG 1000ML POUR BTL (IV SOLUTION) ×4 IMPLANT
PACK C SECTION WH (CUSTOM PROCEDURE TRAY) ×4 IMPLANT
PAD ABD 7.5X8 STRL (GAUZE/BANDAGES/DRESSINGS) ×2 IMPLANT
PAD OB MATERNITY 4.3X12.25 (PERSONAL CARE ITEMS) ×4 IMPLANT
SLEEVE SCD COMPRESS KNEE MED (MISCELLANEOUS) ×2 IMPLANT
SPONGE LAP 18X18 X RAY DECT (DISPOSABLE) ×2 IMPLANT
STRIP CLOSURE SKIN 1/4X4 (GAUZE/BANDAGES/DRESSINGS) IMPLANT
SUT CHROMIC 2 0 SH (SUTURE) ×8 IMPLANT
SUT MNCRL AB 3-0 PS2 27 (SUTURE) ×4 IMPLANT
SUT SILK 0 FSL (SUTURE) ×2 IMPLANT
SUT VIC AB 0 CT1 27 (SUTURE) ×8
SUT VIC AB 0 CT1 27XBRD ANBCTR (SUTURE) ×6 IMPLANT
SUT VIC AB 0 CT1 36 (SUTURE) ×4 IMPLANT
SUT VIC AB 0 CTX 36 (SUTURE)
SUT VIC AB 0 CTX36XBRD ANBCTRL (SUTURE) IMPLANT
SUT VIC AB 2-0 CT1 27 (SUTURE) ×8
SUT VIC AB 2-0 CT1 TAPERPNT 27 (SUTURE) ×6 IMPLANT
SUT VIC AB 2-0 SH 27 (SUTURE)
SUT VIC AB 2-0 SH 27XBRD (SUTURE) IMPLANT
SYR CONTROL 10ML LL (SYRINGE) ×4 IMPLANT
TAPE CLOTH SURG 4X10 WHT LF (GAUZE/BANDAGES/DRESSINGS) ×2 IMPLANT
TOWEL OR 17X24 6PK STRL BLUE (TOWEL DISPOSABLE) ×12 IMPLANT
TRAY FOLEY CATH 14FR (SET/KITS/TRAYS/PACK) ×4 IMPLANT
WATER STERILE IRR 1000ML POUR (IV SOLUTION) ×2 IMPLANT

## 2012-05-31 NOTE — Anesthesia Preprocedure Evaluation (Signed)
Anesthesia Evaluation  Patient identified by MRN, date of birth, ID band Patient awake    Reviewed: Allergy & Precautions, H&P , Patient's Chart, lab work & pertinent test results  Airway Mallampati: III TM Distance: >3 FB Neck ROM: Full    Dental No notable dental hx. (+) Teeth Intact   Pulmonary neg pulmonary ROS,  breath sounds clear to auscultation  Pulmonary exam normal       Cardiovascular hypertension, On Medications Rhythm:Regular Rate:Normal     Neuro/Psych negative neurological ROS  negative psych ROS   GI/Hepatic Neg liver ROS, GERD-  Medicated and Controlled,  Endo/Other  Morbid obesity  Renal/GU negative Renal ROS  negative genitourinary   Musculoskeletal   Abdominal Normal abdominal exam  (+)   Peds  Hematology negative hematology ROS (+) anemia ,   Anesthesia Other Findings   Reproductive/Obstetrics (+) Pregnancy                           Anesthesia Physical Anesthesia Plan  ASA: III  Anesthesia Plan: Spinal   Post-op Pain Management:    Induction:   Airway Management Planned:   Additional Equipment:   Intra-op Plan:   Post-operative Plan:   Informed Consent: I have reviewed the patients History and Physical, chart, labs and discussed the procedure including the risks, benefits and alternatives for the proposed anesthesia with the patient or authorized representative who has indicated his/her understanding and acceptance.     Plan Discussed with: Anesthesiologist, CRNA and Surgeon  Anesthesia Plan Comments:         Anesthesia Quick Evaluation

## 2012-05-31 NOTE — Progress Notes (Signed)
Pt has been told all her b/p's and refused aldomet ordered for today. She plans to speak with Dr. Pennie Rushing about dose tomorrow

## 2012-05-31 NOTE — Addendum Note (Signed)
Addendum  created 05/31/12 2005 by Gertie Fey, CRNA   Modules edited:Notes Section

## 2012-05-31 NOTE — Anesthesia Postprocedure Evaluation (Signed)
  Anesthesia Post-op Note  Patient: Michelle Wilson  Procedure(s) Performed: Procedure(s) (LRB) with comments: BILATERAL SALPINGECTOMY (Bilateral) CESAREAN SECTION (N/A)  Patient Location: Mother/Baby  Anesthesia Type:Spinal  Level of Consciousness: awake, alert  and oriented  Airway and Oxygen Therapy: Patient Spontanous Breathing  Post-op Pain: none  Post-op Assessment: Post-op Vital signs reviewed  Post-op Vital Signs: Reviewed and stable  Complications: No apparent anesthesia complications

## 2012-05-31 NOTE — Op Note (Signed)
Cesarean Section Procedure Note  Indications: previous uterine incision single prior low transverse.     Desire for surgical sterilization  Pre-operative Diagnosis: 39 week 0 day pregnancy.     Prior cesarean section with desire for repeat cesarean section     Desire for surgical sterilization     Chronic hypertension with significant elevation near term  Post-operative Diagnosis: same  Surgeon: Hal Morales  First Assistant:  Surgeon: Hal Morales   Assistants: none  Anesthesia: Spinal anesthesia  ASA Class: 3  Procedure Details  The patient was seen in the Holding Room. The risks, benefits, complications, treatment options, and expected outcomes were discussed with the patient.  The patient concurred with the proposed plan, giving informed consent.  The site of surgery properly noted/marked. The patient was taken to Operating Room # 9, identified as SELENIA MIHOK and the procedure verified as C-Section Delivery. A Time Out was held and the above information confirmed.  After induction of anesthesia, the patient was  prepped with chlor prep in the usual sterile manner.A foley catheter was placed under sterile conditions.  The patient was then draped in the usual fashion.   Suprapubicsubcutaneous injection of 0.25% Bupivacaine for a total of 10 cc.  A Pfannenstiel incision was made and carried down through the subcutaneous tissue to the fascia. Fascial incision was made and extended transversely. The fascia was separated from the underlying rectus tissue superiorly and inferiorly. The peritoneum was identified and entered. Peritoneal incision was extended longitudinally.  The Alexis retractor was placed.   A low transverse uterine incision was made two cm above the uterovesical fold, and that incision extended transversely bluntly.The infant was  delivered from occiput posterior presentation was a living female infant with Apgar scores of 8 at one minute and 9 at five  minutes. After the umbilical cord was clamped and cut cord blood was obtained for evaluation. The placenta was removed intact and appeared normal. The uterine outline, tubes and ovaries appeared norma for the gravid state. The uterine incision was closed with running locked sutures of 0 Vicryl. An imbricating layer of sutures was placed. Hemostasis was observed. Lavage was carried out until clear. The left fallopian tube was identified and followed to its fimbriated end. The mesial salpinx was clamped, cut, and suture ligated with sutures of 2-0 chromic along the entire length of the tube to the uterotubal junction and the entire tube excised. The cut ends were hemostatic and a similar procedure was carried out on the opposite side for completion of the bilateral salpingectomy  Copious irrigation was again carried out and hemostasis noted to be adequate. The peritoneum was closed with a running suture of 2-0 Vicryl.  The rectus muscles were reapproximated with a figure of 8 suture of 2-0 Vicryl.  The fascia was then reapproximated with 2 running sutures of 0 Vicryl from each apex to the midline. A subcutaneous Jackson-Pratt drain was placed through a stab wound in the left lower quadrant and sewn in with a suture of 2-0 silk.   The skin was reapproximated with a subcuticular suture of 3-0 moncryl.  The grenade was attached to the Jackson-Pratt drain A sterile dressing was applied.  Instrument, sponge, and needle counts were correct prior to the abdominal closure and at the conclusion of the case. The infant remained in the operating room with the parents were skin and vomiting. Is anticipated that he would be admitted nursery  Findings:  Placenta contained a 3 vessel cord  Estimated Blood Loss:  1000cc         Drains: Al Pimple         Total IV Fluids:         Specimens: PlacentaTo birthing suite   Right left fallopian tubes         Implants: none         Complications: ::"None;  patient tolerated the procedure well."         Disposition: PACU - hemodynamically stable.         Condition: stable  Attending Attestation: I performed the procedure.  Bana Borgmeyer P  05/31/2012 12:15 PM

## 2012-05-31 NOTE — H&P (Addendum)
Michelle Wilson is a 37 y.o. female presenting for repeat cesarean section and elective tubal sterilization Maternal Medical History:  Prenatal complications: Hypertension.   No bleeding, cholelithiasis, HIV, infection, IUGR, nephrolithiasis, oligohydramnios, placental abnormality, polyhydramnios, pre-eclampsia, preterm labor, substance abuse, thrombocytopenia or thrombophilia.     OB History    Grav Para Term Preterm Abortions TAB SAB Ect Mult Living   5 1 1  3  3   1      Past Medical History  Diagnosis Date  . Hypertension     Aldomet 250 mg Q day  . Infection     Yeast inf; no frequent  . IBS (irritable bowel syndrome)     no issues currently  . Anemia   . GERD (gastroesophageal reflux disease)     pregnancy   Past Surgical History  Procedure Date  . Breast reduction surgery 1997  . Cesarean section 2011   Family History: family history includes Cancer in her maternal uncle; Hypertension in her father, maternal grandfather, maternal grandmother, mother, paternal grandfather, and paternal grandmother; Hyperthyroidism in her maternal aunt; Schizophrenia in her maternal aunt; and Stroke in her maternal grandfather. Social History:  reports that she has never smoked. She has never used smokeless tobacco. She reports that she drinks alcohol. She reports that she does not use illicit drugs.   Prenatal Transfer Tool  Maternal Diabetes: No Genetic Screening: Declined Maternal Ultrasounds/Referrals: Normal Fetal Ultrasounds or other Referrals:  None Maternal Substance Abuse:  No Significant Maternal Medications:  Meds include: Other:  Significant Maternal Lab Results:  None Other Comments:  Chronic hypertension with a question of pre-E. clamped the just prior to C-section. Labs were within normal limits and 24-hour urine is pending  Review of Systems  Constitutional: Negative.   HENT: Negative.   Eyes: Negative.  Negative for blurred vision and double vision.  Respiratory:  Negative.   Cardiovascular: Negative.   Gastrointestinal: Negative.   Genitourinary: Negative.   Musculoskeletal: Negative.   Skin: Negative.   Neurological: Negative.  Negative for headaches.  Endo/Heme/Allergies: Negative.   Psychiatric/Behavioral: Negative.       Blood pressure 172/96, pulse 100, temperature 98.4 F (36.9 C), temperature source Oral, resp. rate 18, last menstrual period 09/01/2011, SpO2 99.00%. Maternal Exam:  Abdomen: Patient reports no abdominal tenderness. Fetal presentation: vertex     Physical Exam  Constitutional: She is oriented to person, place, and time. She appears well-developed and well-nourished.  HENT:  Head: Normocephalic and atraumatic.  Eyes: Conjunctivae normal and EOM are normal.  Neck: Normal range of motion. Neck supple.  Cardiovascular: Normal rate and regular rhythm.   Respiratory: Effort normal and breath sounds normal.  GI: Soft. Bowel sounds are normal.       Term gravid  Musculoskeletal: Normal range of motion.  Neurological: She is alert and oriented to person, place, and time.  Skin: Skin is warm and dry.  Psychiatric: She has a normal mood and affect.       Mildly anxious concerning BP elevation    Prenatal labs: ABO, Rh: --/--/B POS, B POS (02/03 1723) Antibody: NEG (02/03 1723) Rubella: 15.7 (08/20 1602) RPR: NON REACTIVE (02/03 1723)  HBsAg: NEGATIVE (08/20 1602)  HIV: Non-reactive, Non-reactive (08/21 0000)  GBS: POSITIVE (01/16 1647)   Assessment/ IUP at 39 wks Chronic hypertension  Desire for surgical sterilization Prior cesarean section with desire for repeat cesarean section  Plan: Repeat cesarean section Intraoperative tubal sterilization procedure. Patient desires permanent sterilization.  Other tearreversible forms of  contraception were discussed with patient; she declines all other modalities. Risks of procedure discussed with patient including but not limited to: risk of regret, permanence of method,  bleeding, infection, injury to surrounding organs and need for additional procedures.  Failure risk of 0.5-1% with increased risk of ectopic gestation if pregnancy occurs was also discussed with patient.The patient understands that these risks are minimized by bilateral salpingectomy, and that will be performed if it is feasible during her surgery   Patient verbalized understanding of these risks and wants to proceed with sterilization.  Written informed consent obtained.  The patient presented to preadmission with a 24-hour urine specimen.  It had not been refrigerated and therefore could not be used. We will plan to do a 24-hour urine collection  postoperatively to evaluate for preeclampsia peer    Edgar Corrigan P 05/31/2012, 9:35 AM

## 2012-05-31 NOTE — Transfer of Care (Signed)
Immediate Anesthesia Transfer of Care Note  Patient: Michelle Wilson  Procedure(s) Performed: Procedure(s) (LRB) with comments: BILATERAL SALPINGECTOMY (Bilateral) CESAREAN SECTION (N/A)  Patient Location: PACU  Anesthesia Type:Spinal  Level of Consciousness: awake, alert  and oriented  Airway & Oxygen Therapy: Patient Spontanous Breathing  Post-op Assessment: Report given to PACU RN and Post -op Vital signs reviewed and stable  Post vital signs: stable  Complications: No apparent anesthesia complications

## 2012-05-31 NOTE — OR Nursing (Signed)
24 hour collection started in pacu at 13:15. Urine collection conatiner obtained from lab. Kept on ice. Margarita Mail rn

## 2012-05-31 NOTE — Anesthesia Procedure Notes (Signed)
Spinal  Patient location during procedure: OR Start time: 05/31/2012 10:04 AM Staffing Anesthesiologist: Ionna Avis A. Performed by: anesthesiologist  Preanesthetic Checklist Completed: patient identified, site marked, surgical consent, pre-op evaluation, timeout performed, IV checked, risks and benefits discussed and monitors and equipment checked Spinal Block Patient position: sitting Prep: site prepped and draped and DuraPrep Patient monitoring: heart rate, cardiac monitor, continuous pulse ox and blood pressure Approach: midline Location: L3-4 Injection technique: single-shot Needle Needle type: Sprotte and Tuohy  Needle gauge: 24 G Needle length: 9 cm Assessment Sensory level: T4 Events: paresthesia Additional Notes Patient tolerated procedure well. Attempt x 2 with 3.5 in Sprotte. Attempt x 2 with 24ga 4in Pencan. Attempt x1 with 17Ga Touhy needle lor with air at 10cm. 25ga 5in Pencan through epidural needle, CSF clear free flow, transient  Paresthesia right foot.  Difficult due to MO and poor positioning.

## 2012-05-31 NOTE — Anesthesia Postprocedure Evaluation (Signed)
  Anesthesia Post-op Note  Patient: Michelle Wilson  Procedure(s) Performed: Procedure(s) (LRB) with comments: BILATERAL SALPINGECTOMY (Bilateral) CESAREAN SECTION (N/A)  Patient Location: PACU  Anesthesia Type:Spinal  Level of Consciousness: awake, alert  and oriented  Airway and Oxygen Therapy: Patient Spontanous Breathing  Post-op Pain: none  Post-op Assessment: Post-op Vital signs reviewed, Patient's Cardiovascular Status Stable, Respiratory Function Stable, Patent Airway, No signs of Nausea or vomiting, Pain level controlled, No headache, No backache, No residual numbness and No residual motor weakness  Post-op Vital Signs: Reviewed and stable  Complications: No apparent anesthesia complications

## 2012-06-01 ENCOUNTER — Encounter (HOSPITAL_COMMUNITY): Payer: Self-pay | Admitting: Obstetrics and Gynecology

## 2012-06-01 LAB — COMPREHENSIVE METABOLIC PANEL
Albumin: 2 g/dL — ABNORMAL LOW (ref 3.5–5.2)
Alkaline Phosphatase: 121 U/L — ABNORMAL HIGH (ref 39–117)
BUN: 6 mg/dL (ref 6–23)
Chloride: 100 mEq/L (ref 96–112)
Creatinine, Ser: 0.71 mg/dL (ref 0.50–1.10)
GFR calc Af Amer: >90 mL/min (ref >90)
GFR calc non Af Amer: >90 mL/min (ref >90)
Glucose, Bld: 106 mg/dL — ABNORMAL HIGH (ref 70–99)
Total Bilirubin: 0.4 mg/dL (ref 0.3–1.2)

## 2012-06-01 LAB — BIRTH TISSUE RECOVERY COLLECTION (PLACENTA DONATION)

## 2012-06-01 LAB — PROTEIN, URINE, 24 HOUR
Collection Interval-UPROT: 24 hours
Protein, 24H Urine: 196 mg/d — ABNORMAL HIGH (ref 50–100)
Urine Total Volume-UPROT: 2800 mL

## 2012-06-01 LAB — URIC ACID: Uric Acid, Serum: 5.1 mg/dL (ref 2.4–7.0)

## 2012-06-01 LAB — CBC
HCT: 27.8 % — ABNORMAL LOW (ref 36.0–46.0)
MCHC: 32.7 g/dL (ref 30.0–36.0)
RDW: 15.4 % (ref 11.5–15.5)
WBC: 9.2 10*3/uL (ref 4.0–10.5)

## 2012-06-01 LAB — CCBB MATERNAL DONOR DRAW

## 2012-06-01 NOTE — Progress Notes (Signed)
Post Partum Day 1 Repeat LTCS and BTL  Subjective: up ad lib and tolerating PO No headaches, blurred vision, or RUQ tenderness.  Objective: Blood pressure 138/85, pulse 92, temperature 97.9 F (36.6 C), temperature source Oral, resp. rate 20, weight 278 lb (126.1 kg), last menstrual period 09/01/2011, SpO2 99.00%, unknown if currently breastfeeding.  Physical Exam:  General: no distress Chest: Clear Heart: RRR Lochia: appropriate Uterine Fundus: firm Incision: Dressing Clean DVT Evaluation: No evidence of DVT seen on physical exam. Reflexes normal   Basename 06/01/12 0600 05/29/12 1723  HGB 9.1* 10.9*  HCT 27.8* 32.7*   CMP     Component Value Date/Time   NA 133* 06/01/2012 0600   K 3.5 06/01/2012 0600   CL 100 06/01/2012 0600   CO2 24 06/01/2012 0600   GLUCOSE 106* 06/01/2012 0600   BUN 6 06/01/2012 0600   CREATININE 0.71 06/01/2012 0600   CALCIUM 8.5 06/01/2012 0600   PROT 5.8* 06/01/2012 0600   ALBUMIN 2.0* 06/01/2012 0600   AST 19 06/01/2012 0600   ALT 14 06/01/2012 0600   ALKPHOS 121* 06/01/2012 0600   BILITOT 0.4 06/01/2012 0600   GFRNONAA >90 06/01/2012 0600   GFRAA >90 06/01/2012 0600   Assessment/Plan:  Doing well. No sign of Preeclampsia. Anemia  Check 24 hour urine protein.   LOS: 1 day   Michelle Wilson V 06/01/2012, 1:45 PM

## 2012-06-02 DIAGNOSIS — Z98891 History of uterine scar from previous surgery: Secondary | ICD-10-CM | POA: Diagnosis not present

## 2012-06-02 NOTE — Progress Notes (Signed)
Subjective: Postpartum Day 2: Cesarean Delivery Patient reports incisional pain, tolerating PO, + flatus and no problems voiding.  Ambulating without difficulty.  Objective: Vital signs in last 24 hours: Temp:  [97.8 F (36.6 C)-98.5 F (36.9 C)] 97.8 F (36.6 C) (02/07 1432) Pulse Rate:  [84-103] 96  (02/07 1432) Resp:  [20-72] 72  (02/07 0558) BP: (119-144)/(69-85) 144/78 mmHg (02/07 1432) SpO2:  [100 %] 100 % (02/06 2155) JP drain 20cc/24hrs  Physical Exam:  General: alert and no distress Lochia: appropriate Uterine Fundus: firm Incision: dressing intact and JP in place DVT Evaluation: No evidence of DVT seen on physical exam.   Basename 06/01/12 0600  HGB 9.1*  HCT 27.8*    Assessment/Plan: Status post Cesarean section. Doing well postoperatively.  Continue current care. Anticipate d/c tomorrow. Will remove JP drain prior to d/c home as the output is minimal. BPs are under fair control on aldomet 500mg  po bid. No s/sxs of preeclampsia Voiding without difficulty  Haruka Kowaleski Y 06/02/2012, 2:52 PM

## 2012-06-03 MED ORDER — OXYCODONE-ACETAMINOPHEN 5-325 MG PO TABS: 1.0000 | ORAL_TABLET | ORAL | Status: DC | PRN

## 2012-06-03 MED ORDER — METHYLDOPA 250 MG PO TABS: ORAL_TABLET | ORAL | Status: DC

## 2012-06-03 MED ORDER — FERROUS SULFATE 325 (65 FE) MG PO TABS: 325.0000 mg | ORAL_TABLET | Freq: Every day | ORAL | Status: DC

## 2012-06-03 MED ORDER — IBUPROFEN 600 MG PO TABS: 600.0000 mg | ORAL_TABLET | Freq: Four times a day (QID) | ORAL | Status: DC | PRN

## 2012-06-03 NOTE — Discharge Summary (Signed)
Obstetric Discharge Summary Reason for Admission: cesarean section Prenatal Procedures: ultrasound Intrapartum Procedures: cesarean: low cervical, transverse and tubal ligation Postpartum Procedures: none Complications-Operative and Postpartum: none Hemoglobin  Date Value Range Status  06/01/2012 9.1* 12.0 - 15.0 g/dL Final     HCT  Date Value Range Status  06/01/2012 27.8* 36.0 - 46.0 % Final   Pt admitted for repeat LTCS and bilateral tubal ligation.  Pregnancy complicated by exacerbation of chronic hypertension with increase of medications approx 1wk prior to admission.  Pt underwent LTCS and BTL without complications.  Elevated BP noted on admission therefore 24hr urine collected with PIH labs and all labs without evidence of preeclampsia.  BPs intermittently elevated throughout postoperative course however pt without symptoms.  Aldomet increased to 500mg  BID with slight improvement in blood pressure readings.  Pt remained afebrile and was tol po liquids and solids as well as ambulating without difficulty at time of discharge.  Pt's pain controlled with medications.    Physical Exam:  Filed Vitals:   06/02/12 0558 06/02/12 1432 06/02/12 2128 06/03/12 0640  BP: 119/69 144/78 146/94 132/85  Pulse: 84 96 108 99  Temp: 98.4 F (36.9 C) 97.8 F (36.6 C) 97.9 F (36.6 C) 98 F (36.7 C)  TempSrc: Oral Oral Oral Oral  Resp: 72  20 18  Weight:      SpO2:       General: alert, cooperative and no distress Heart:  RRR Lungs:  CTA bilat Abd:  Soft, NT with pos BS x 4 quads Lochia: appropriate, sm rubra Uterine Fundus: firm, NT 1 below umb. Incision: healing well, intact without redness or drainage.  JP removed at time of discharge.  DVT Evaluation: No evidence of DVT seen on physical exam. Negative Homan's sign bilat. No significant calf/ankle edema.  Results for orders placed during the hospital encounter of 05/31/12 (from the past 72 hour(s))  PROTEIN, URINE, 24 HOUR     Status:  Abnormal   Collection Time    05/31/12  1:15 PM      Result Value Range   Urine Total Volume-UPROT 2800     Collection Interval-UPROT 24     Protein, Urine 7     Protein, 24H Urine 196 (*) 50 - 100 mg/day  BIRTH TISSUE RECOVERY COLLECTION (PLACENTA DONATION)     Status: None   Collection Time    06/01/12  6:00 AM      Result Value Range   Placenta donation bld collect COLLECTED BY LABORATORY     Comment: BR  CBC     Status: Abnormal   Collection Time    06/01/12  6:00 AM      Result Value Range   WBC 9.2  4.0 - 10.5 K/uL   RBC 3.24 (*) 3.87 - 5.11 MIL/uL   Hemoglobin 9.1 (*) 12.0 - 15.0 g/dL   HCT 16.1 (*) 09.6 - 04.5 %   MCV 85.8  78.0 - 100.0 fL   MCH 28.1  26.0 - 34.0 pg   MCHC 32.7  30.0 - 36.0 g/dL   RDW 40.9  81.1 - 91.4 %   Platelets 151  150 - 400 K/uL  CCBB MATERNAL DONOR DRAW     Status: None   Collection Time    06/01/12  6:00 AM      Result Value Range   CCBB Maternal Donor Draw COLLECTED BY LABORATORY     Comment: BR  COMPREHENSIVE METABOLIC PANEL     Status: Abnormal  Collection Time    06/01/12  6:00 AM      Result Value Range   Sodium 133 (*) 135 - 145 mEq/L   Potassium 3.5  3.5 - 5.1 mEq/L   Chloride 100  96 - 112 mEq/L   CO2 24  19 - 32 mEq/L   Glucose, Bld 106 (*) 70 - 99 mg/dL   BUN 6  6 - 23 mg/dL   Creatinine, Ser 1.61  0.50 - 1.10 mg/dL   Calcium 8.5  8.4 - 09.6 mg/dL   Total Protein 5.8 (*) 6.0 - 8.3 g/dL   Albumin 2.0 (*) 3.5 - 5.2 g/dL   AST 19  0 - 37 U/L   ALT 14  0 - 35 U/L   Alkaline Phosphatase 121 (*) 39 - 117 U/L   Total Bilirubin 0.4  0.3 - 1.2 mg/dL   GFR calc non Af Amer >90  >90 mL/min   GFR calc Af Amer >90  >90 mL/min   Comment:            The eGFR has been calculated     using the CKD EPI equation.     This calculation has not been     validated in all clinical     situations.     eGFR's persistently     <90 mL/min signify     possible Chronic Kidney Disease.  URIC ACID     Status: None   Collection Time     06/01/12  6:00 AM      Result Value Range   Uric Acid, Serum 5.1  2.4 - 7.0 mg/dL    Discharge Diagnoses: Term Pregnancy-delivered                                         S/P repeat LTCS with BTL                                         Asymptomatic anemia                                         Chronic hypertension without evidence of preeclampsia.   Discharge Information: Date: 06/03/2012 Activity: unrestricted Diet: routine Medications: PNV, Ibuprofen, Iron and Percocet Aldomet 500mg  bid Condition: stable Instructions: See after visit summary (postop C/S, anemia, hypertension) and pt to perform home BP measurements with call parameters reviewed. Discharge to: home Follow-up Information   Follow up with [facility] In 6 days. (Call to schedule an appt for the upcoming week.)    Contact information:   [address], [address] [place] [address] [phone]      Newborn Data: Live born female  Birth Weight: 6 lb 13 oz (3090 g) APGAR: 8, 9  Home with mother.  Circ completed during hospitalization.  Shiron Whetsel O. 06/03/2012, 9:27 AM

## 2012-06-03 NOTE — Progress Notes (Signed)
Subjective: Postpartum Day 3: Cesarean Delivery Patient reports feeling well.  Reports incisional pain relieved with meds.  Ambulating, voiding and tol po liquids and solids without difficulty.  Pos flatus, neg BM.  Is formula feeding due to hx of breast reduction.  Denies weakness or dizziness.  Denies headaches, vision chgs, RUQ pain.    Objective: Vital signs in last 24 hours: Temp:  [97.8 F (36.6 C)-98 F (36.7 C)] 98 F (36.7 C) (02/08 0640) Pulse Rate:  [96-108] 99 (02/08 0640) Resp:  [18-20] 18 (02/08 0640) BP: (132-146)/(78-94) 132/85 mmHg (02/08 0640)  Physical Exam:  General: alert, cooperative and no distress Heart:  RRR Lungs:  CTA bilat Abd:  Soft, NT with pos BS x 4 quads.   Lochia: appropriate, sm rubra Uterine Fundus: firm, NT 1 below umb Incision: healing well, without redness or drainage.  JP with 22cc (17cc 7A-7P, 5cc 7P-7-A) serosanguineous drainage in past 24hrs, scant amt in bulb at present. DVT Evaluation: No evidence of DVT seen on physical exam. Negative Homan's sign bilaterally No significant calf/ankle edema.   Results for orders placed during the hospital encounter of 05/31/12 (from the past 72 hour(s))  PROTEIN, URINE, 24 HOUR     Status: Abnormal   Collection Time    05/31/12  1:15 PM      Result Value Range   Urine Total Volume-UPROT 2800     Collection Interval-UPROT 24     Protein, Urine 7     Protein, 24H Urine 196 (*) 50 - 100 mg/day  BIRTH TISSUE RECOVERY COLLECTION (PLACENTA DONATION)     Status: None   Collection Time    06/01/12  6:00 AM      Result Value Range   Placenta donation bld collect COLLECTED BY LABORATORY     Comment: BR  CBC     Status: Abnormal   Collection Time    06/01/12  6:00 AM      Result Value Range   WBC 9.2  4.0 - 10.5 K/uL   RBC 3.24 (*) 3.87 - 5.11 MIL/uL   Hemoglobin 9.1 (*) 12.0 - 15.0 g/dL   HCT 45.4 (*) 09.8 - 11.9 %   MCV 85.8  78.0 - 100.0 fL   MCH 28.1  26.0 - 34.0 pg   MCHC 32.7  30.0 - 36.0  g/dL   RDW 14.7  82.9 - 56.2 %   Platelets 151  150 - 400 K/uL  CCBB MATERNAL DONOR DRAW     Status: None   Collection Time    06/01/12  6:00 AM      Result Value Range   CCBB Maternal Donor Draw COLLECTED BY LABORATORY     Comment: BR  COMPREHENSIVE METABOLIC PANEL     Status: Abnormal   Collection Time    06/01/12  6:00 AM      Result Value Range   Sodium 133 (*) 135 - 145 mEq/L   Potassium 3.5  3.5 - 5.1 mEq/L   Chloride 100  96 - 112 mEq/L   CO2 24  19 - 32 mEq/L   Glucose, Bld 106 (*) 70 - 99 mg/dL   BUN 6  6 - 23 mg/dL   Creatinine, Ser 1.30  0.50 - 1.10 mg/dL   Calcium 8.5  8.4 - 86.5 mg/dL   Total Protein 5.8 (*) 6.0 - 8.3 g/dL   Albumin 2.0 (*) 3.5 - 5.2 g/dL   AST 19  0 - 37 U/L   ALT 14  0 - 35 U/L   Alkaline Phosphatase 121 (*) 39 - 117 U/L   Total Bilirubin 0.4  0.3 - 1.2 mg/dL   GFR calc non Af Amer >90  >90 mL/min   GFR calc Af Amer >90  >90 mL/min   Comment:            The eGFR has been calculated     using the CKD EPI equation.     This calculation has not been     validated in all clinical     situations.     eGFR's persistently     <90 mL/min signify     possible Chronic Kidney Disease.  URIC ACID     Status: None   Collection Time    06/01/12  6:00 AM      Result Value Range   Uric Acid, Serum 5.1  2.4 - 7.0 mg/dL   Assessment/Plan: Status post Cesarean section and BTL. Doing well postoperatively.  Asymptomatic anemia. Chronic hypertension without evidence of preeclampsia.  Discharge to home. Discharge instructions reviewed. Rx:  Motrin, Percocet, Iron, Aldomet 500mg  bid (to continue from home rx) Pt to RTO in 1wk for BP check and pt instructed to check BPs at home prior to follow-up visit with call parameters discussed. JP drain removed without difficulty and pt tolerated without issues.     Shaindel Sweeten O. 06/03/2012, 9:06 AM

## 2012-06-06 ENCOUNTER — Telehealth: Payer: Self-pay | Admitting: Obstetrics and Gynecology

## 2012-06-06 NOTE — Telephone Encounter (Signed)
Spoke with pt rgd msg pt states del 05/31/12 need bp check pt has appt 06/15/12 at 12:00 with [facility] pt voice understanding

## 2012-06-12 ENCOUNTER — Telehealth: Payer: Self-pay | Admitting: Obstetrics and Gynecology

## 2012-06-12 NOTE — Telephone Encounter (Signed)
TC to pt. States BP 160/100. Continues taking Aldomet 500mg  BID.  Denies any headache, edema, vision disturbances. Per [facility], may have eval tonight at MAU or with SL 06/13/12. Prefers. eval in office. To call with any increase in sx or concerns. Pt verbalizes comprehension.

## 2012-06-13 ENCOUNTER — Encounter: Payer: Self-pay | Admitting: Obstetrics and Gynecology

## 2012-06-13 ENCOUNTER — Ambulatory Visit: Payer: 59 | Admitting: Obstetrics and Gynecology

## 2012-06-13 ENCOUNTER — Ambulatory Visit: Payer: 59 | Admitting: Family Medicine

## 2012-06-13 VITALS — BP 170/104 | Temp 99.0°F | Ht 66.0 in | Wt 257.0 lb

## 2012-06-13 DIAGNOSIS — I10 Essential (primary) hypertension: Secondary | ICD-10-CM

## 2012-06-13 LAB — POCT URINALYSIS DIPSTICK
Blood, UA: NEGATIVE
Glucose, UA: NEGATIVE
Nitrite, UA: NEGATIVE
Spec Grav, UA: <=1.005
Urobilinogen, UA: NEGATIVE

## 2012-06-13 LAB — COMPREHENSIVE METABOLIC PANEL
ALT: 17 U/L (ref 0–35)
AST: 18 U/L (ref 0–37)
CO2: 27 mEq/L (ref 19–32)
Calcium: 9.3 mg/dL (ref 8.4–10.5)
Chloride: 99 mEq/L (ref 96–112)
Potassium: 3.8 mEq/L (ref 3.5–5.3)
Sodium: 138 mEq/L (ref 135–145)
Total Protein: 7.6 g/dL (ref 6.0–8.3)

## 2012-06-13 LAB — CBC WITH DIFFERENTIAL/PLATELET
Basophils Absolute: 0 10*3/uL (ref 0.0–0.1)
Basophils Relative: 1 % (ref 0–1)
Eosinophils Relative: 2 % (ref 0–5)
HCT: 36.1 % (ref 36.0–46.0)
Lymphocytes Relative: 27 % (ref 12–46)
MCHC: 33.5 g/dL (ref 30.0–36.0)
Monocytes Absolute: 0.3 10*3/uL (ref 0.1–1.0)
Neutro Abs: 4.2 10*3/uL (ref 1.7–7.7)
Platelets: 297 10*3/uL (ref 150–400)
RDW: 14.1 % (ref 11.5–15.5)
WBC: 6.4 10*3/uL (ref 4.0–10.5)

## 2012-06-13 LAB — URIC ACID: Uric Acid, Serum: 4.3 mg/dL (ref 2.4–7.0)

## 2012-06-13 MED ORDER — LOSARTAN POTASSIUM-HCTZ 100-25 MG PO TABS: 1.0000 | ORAL_TABLET | Freq: Every day | ORAL | Status: DC

## 2012-06-13 NOTE — Progress Notes (Signed)
S: pt reports not able to sleep since husband returned to work this week, feels this is attributing to increased BP Denies any sig pain Denies HA/N/V/RUQ pain or blurry vision, no edema  O: BP 170/104  Repeat 169/109 PE:  Gen: NAD Lungs: CTAB Heart: RRR Abd: soft, NT, incision healing well Pelvic: deferred Ext: no edema, reflexes 1+, neg clonus  A: 2wks s/p repeat c/s Chronic HTN, on aldomet 500mg  BID, r/o pre-eclampsia  Not currently BF UA - tr protein, otherwise neg Labs - platelets 297 H/h 12.1/36.1 Creat - .86 AST - 18 ALT 17 Uric acid 4.3    P:   D/c aldomet and restart hyzaar 100/25 PO daily  Keep appt scheduled on Thursday rv'd s/s pre-eclampsia  D/w Dr Su Hilt

## 2012-06-15 ENCOUNTER — Ambulatory Visit: Payer: 59

## 2012-06-15 VITALS — BP 170/96 | Wt 257.0 lb

## 2012-06-15 MED ORDER — LABETALOL HCL 100 MG PO TABS: 200.0000 mg | ORAL_TABLET | Freq: Two times a day (BID) | ORAL | Status: DC

## 2012-06-16 LAB — LACTATE DEHYDROGENASE, ISOENZYMES
LD1/LD2 Ratio: 0.62
LDH 5: 12 % (ref 3–14)

## 2012-06-19 ENCOUNTER — Other Ambulatory Visit: Payer: Self-pay | Admitting: Obstetrics and Gynecology

## 2012-06-19 ENCOUNTER — Inpatient Hospital Stay (HOSPITAL_COMMUNITY)
Admission: AD | Admit: 2012-06-19 | Discharge: 2012-06-19 | Disposition: A | Discharge status: Hospice | Payer: 59 | Source: Ambulatory Visit | Attending: Obstetrics and Gynecology | Admitting: Obstetrics and Gynecology

## 2012-06-19 ENCOUNTER — Telehealth: Payer: Self-pay | Admitting: Obstetrics and Gynecology

## 2012-06-19 ENCOUNTER — Other Ambulatory Visit: Payer: 59

## 2012-06-19 ENCOUNTER — Encounter (HOSPITAL_COMMUNITY): Payer: Self-pay | Admitting: *Deleted

## 2012-06-19 DIAGNOSIS — O99893 Other specified diseases and conditions complicating puerperium: Secondary | ICD-10-CM | POA: Insufficient documentation

## 2012-06-19 DIAGNOSIS — R030 Elevated blood-pressure reading, without diagnosis of hypertension: Secondary | ICD-10-CM | POA: Insufficient documentation

## 2012-06-19 DIAGNOSIS — I10 Essential (primary) hypertension: Secondary | ICD-10-CM

## 2012-06-19 LAB — COMPREHENSIVE METABOLIC PANEL
AST: 16 U/L (ref 0–37)
Albumin: 3.5 g/dL (ref 3.5–5.2)
BUN: 9 mg/dL (ref 6–23)
Calcium: 9.5 mg/dL (ref 8.4–10.5)
Creatinine, Ser: 0.83 mg/dL (ref 0.50–1.10)
Total Bilirubin: 0.4 mg/dL (ref 0.3–1.2)
Total Protein: 7.8 g/dL (ref 6.0–8.3)

## 2012-06-19 LAB — CBC
HCT: 35.5 % — ABNORMAL LOW (ref 36.0–46.0)
MCH: 28 pg (ref 26.0–34.0)
MCHC: 33.2 g/dL (ref 30.0–36.0)
MCV: 84.3 fL (ref 78.0–100.0)
Platelets: 276 10*3/uL (ref 150–400)
RDW: 14 % (ref 11.5–15.5)

## 2012-06-19 LAB — URINALYSIS, ROUTINE W REFLEX MICROSCOPIC
Ketones, ur: NEGATIVE mg/dL
Nitrite: NEGATIVE
pH: 7 (ref 5.0–8.0)

## 2012-06-19 LAB — POCT URINALYSIS DIPSTICK
Ketones, UA: NEGATIVE
Protein, UA: NEGATIVE
Spec Grav, UA: <=1.005
Urobilinogen, UA: NEGATIVE
pH, UA: 8.5

## 2012-06-19 LAB — URINE MICROSCOPIC-ADD ON

## 2012-06-19 LAB — URIC ACID: Uric Acid, Serum: 5.1 mg/dL (ref 2.4–7.0)

## 2012-06-19 MED ORDER — POTASSIUM CHLORIDE CRYS ER 20 MEQ PO TBCR: 40.0000 meq | EXTENDED_RELEASE_TABLET | Freq: Every day | ORAL | Status: DC

## 2012-06-19 MED ORDER — ALPRAZOLAM 0.5 MG PO TABS: ORAL_TABLET | ORAL | Status: DC

## 2012-06-19 MED ORDER — NIFEDIPINE ER OSMOTIC RELEASE 30 MG PO TB24: 30.0000 mg | ORAL_TABLET | Freq: Every day | ORAL | Status: DC

## 2012-06-19 MED ORDER — NIFEDIPINE ER OSMOTIC RELEASE 30 MG PO TB24
30.0000 mg | ORAL_TABLET | Freq: Once | ORAL | Status: AC
  Administered 2012-06-19: 30 mg via ORAL
  Filled 2012-06-19: qty 1

## 2012-06-19 NOTE — MAU Provider Note (Signed)
"  Feels fine".  Denies dizziness, HA, or any other symptoms. Filed Vitals:   06/19/12 1905 06/19/12 1919 06/19/12 1948 06/19/12 2008  BP: 139/68 147/79 150/77 145/87  Pulse: 91 83 84 84  Temp:      TempSrc:      Resp:   18   SpO2:       Received Procardia 30 mg XL at 6pm, with improvement of BP since the dose.  Consulted with Dr. Su Hilt. Will d/c home at 9pm. Continue Hyzar at present dose. Continue Procardia 30 mg XL daily--Rx sent to pharmacy. Rx Kdur 40 meq daily x 7 days--Rx sent to pharmacy. Office will call to make an appt for recheck of BP on Wednesday. To call with any s/s of dizziness, etc--to push fluids and call office if sx of low BP occurs. Has BP cuff at home--can monitor PRN.  Nigel Bridgeman, CNM 06/19/12 9p

## 2012-06-19 NOTE — Progress Notes (Signed)
Patient has delivered and is requesting medication for anxiety.  Per Dr. Su Hilt, who is on call, patient may have some but since she is not available issue a prescription that is not able to be sent electronically.  I will issue a prescription for Xanax 0.5 mg  #30 1 po tid prn-anxiety with no refill and follow up with Dr. Su Hilt.  Jaylise Peek, PA-C

## 2012-06-19 NOTE — Telephone Encounter (Signed)
Late entry 06/16/12:Lori, the nurse from [facility] called w/ pt's BP today.  BP was 152/92 in the left and 156/94 in the right arm, HR=78, R=20.  The pt did a blood pressure with her own auto BP cuff w/ a reading of 156/95.  Pt was recently switched back to Hyzaar 100-25 by SL which the pt started yesterday (06/15/12).  Pt denied any sxs other than some swelling in her fingers.  Lawson Fiscal states she can do the pt's BP again on Monday while she is in [facility] instead of the pt having to come to the office for the scheduled BP check.  Consulted w/ SR about pt, pt advised to continue taking the Hyzaar and Lawson Fiscal can recheck BP on Monday 06/19/12.  Pt stated that she is taking that and Labetalol as well.   Pt also advised after her PP visit to make sure she continues seeing her PCP for maintenance of her BP, pt voices agreement.  Lm on Lori's vm that it is ok to recheck pt BP on Monday.

## 2012-06-19 NOTE — Progress Notes (Signed)
Subjective:     Patient ID: Michelle Wilson, female   DOB: January 20, 1976, 37 y.o.   MRN: [id number]  HPI Pt presents for BP check.  Seen 2d ago on 06/13/12 for BP and had normal PIH labs and workup and d/c'd Aldomet and restarted Hyzaar 100/25 (has had 2 doses total thus far & on this prior to pregnancy).  Reports approximately 30lb wt loss.  Has mechanical BP cuff at home and reports systolics at home in 130s/140s.  States "no sleep" recently and feels BP up b/c of this.  Denies HA, visual changes, epigastric pain.    Review of Systems No resp or GI c/o's.  No PIH or UTI s/s.  VB minimal or none.    Objective:   Physical Exam .. Filed Vitals:   06/15/12 1223 06/15/12 1339 06/15/12 1349  BP: 140/100 172/110 170/96  Weight: 257 lb (116.574 kg)    Gen: A&Ox3, pleasant, smiling CV: RRR Lungs: CTA B Abd: soft, NT; incision c/d/i Ext: mild gen edema, DTR's 1+ BLE; no clonus    Assessment:     1. CHTN w/ cont'd elevation despite changes in meds this week 2. S/p repeat c/s 05/31/12 3. Obese 4. Formula feeding 5. Sleep deprivation r/t newborn care    Plan:     1. Per c/w dr. Su Hilt in office, continue Hyzaar and start Labetalol 200mg  po bid 2. RTO Mon of next week 2/24 for BP check; will have [facility] BP check tomorrow afternoon and coordinate visits next week as well. 3. Rev'd PIH precautions and s/s to report immediately; enc'd pt to utilize family/friends support if available to try to get more rest/sleep 4. F/u prn

## 2012-06-19 NOTE — Telephone Encounter (Signed)
Consult with DR AR. Pt to come to office for BP check. Notify Dr Alinda Sierras of result. TC to pt. States checked her BP herself and is decreased. Recommended pt be seen in office. States took med at 7 am. Scheduled at 2:30.

## 2012-06-19 NOTE — MAU Note (Signed)
Patient states she delivered by cesarean section on 2-5. States blood pressure has been fluctuating since delivery. Denies any symptoms, no headache or visual problems.

## 2012-06-19 NOTE — Progress Notes (Signed)
Pt here for BP check.  Left-186/112, right 192/110 pt denies any headache, blurred vision, any symptoms.  Per AR inst. Pt to take # 4 Labetalol 100mg  each.  Recheck pressure in approx 1 to 1 1/2 hrs.  Pt chose to stay in office and relax in a room.  Pt request Rx for Xanax.  Will consult with EP per AR.   Rechecked pressure after 1 hr 15 min.  Still elevated at right 180/102  Left-178/114.per AR pt sent to MAU. Pt also got Rx from EP for Xanax 0.5 mg #30  Stephens Shire reported BPs to me and I instructed her to send pt to MAU for further eval.

## 2012-06-19 NOTE — MAU Provider Note (Addendum)
History     CSN: [id number]  Arrival date and time: 06/19/12 1612   None     Chief Complaint  Patient presents with  . Hypertension   HPI  37yo P2 s/p c-section who was sent from office after presenting with elevated BPs when smart start nurse went to her home today.  Pt is on hyzar which she was on pregestation and changed from aldomet to labetalol 200mg  bid and given an additional dose of 400mg  labetalol at about 2pm with repeat BP still significantly elevated.  Upon presentation here, BP is elevated and pt denies any sxs ie HA, visual changes, SOB or abdominal pain.  She had neg preeclampsia w/u about 1wk ago and is a known Location manager.  OB History   Grav Para Term Preterm Abortions TAB SAB Ect Mult Living   6 2 2  3  3   2       Past Medical History  Diagnosis Date  . Hypertension     Aldomet 250 mg Q day  . Infection     Yeast inf; no frequent  . IBS (irritable bowel syndrome)     no issues currently  . Anemia   . GERD (gastroesophageal reflux disease)     pregnancy    Past Surgical History  Procedure Laterality Date  . Breast reduction surgery  1997  . Cesarean section  2011  . Bilateral salpingectomy  05/31/2012    Procedure: BILATERAL SALPINGECTOMY;  Surgeon: Hal Morales, MD;  Location: [facility];  Service: Obstetrics;  Laterality: Bilateral;  . Cesarean section  05/31/2012    Procedure: CESAREAN SECTION;  Surgeon: Hal Morales, MD;  Location: [facility];  Service: Obstetrics;  Laterality: N/A;    Family History  Problem Relation Age of Onset  . Hypertension Mother   . Hypertension Father   . Hypertension Maternal Grandmother   . Hypertension Maternal Grandfather   . Hypertension Paternal Grandmother   . Hypertension Paternal Grandfather   . Hyperthyroidism Maternal Aunt     x 3  . Stroke Maternal Grandfather   . Schizophrenia Maternal Aunt   . Cancer Maternal Uncle     Lung deceased    History  Substance Use Topics  . Smoking status: Never Smoker    . Smokeless tobacco: Never Used  . Alcohol Use: No     Comment: Occasionally, not while pregnant    Allergies: No Known Allergies  Prescriptions prior to admission  Medication Sig Dispense Refill  . labetalol (NORMODYNE) 100 MG tablet Take 2 tablets (200 mg total) by mouth 2 (two) times daily.  60 tablet  1  . losartan-hydrochlorothiazide (HYZAAR) 100-25 MG per tablet Take 1 tablet by mouth daily.  30 tablet  2  . Prenatal Vit-Fe Sulfate-FA (PRENATAL VITAMIN PO) Take 1 tablet by mouth daily.        ROS Physical Exam   Blood pressure 173/85, pulse 77, temperature 99.4 F (37.4 C), temperature source Oral, resp. rate 20, last menstrual period 09/01/2011, SpO2 100.00%.  Physical Exam  Lungs cta bil cv rrr Ext no calf tenderness, neg edema  MAU Course  Procedures  Labs - cbc, cmet, uric acid, ua   Assessment and Plan  P2 s/p c-section on 2/5 with elevated BPs not being controlled on current BP regimen.  I doubt preeclampsia but will check labs and UA.  This is likely an exacerbation on her CHTN.  Will try procardia xl 30mg  po now.  Purcell Nails 06/19/2012, 5:49 PM

## 2012-06-19 NOTE — Telephone Encounter (Signed)
TC from pt. States Michelle Wilson, home nurse checked BP today.  171/100. Is taking Hyzarr 110/25 qd  and Labetalol 200 mg bid. Denies headache, vision disturbances. States still has slight edema of fingers but has not increased.  States had very little sleep last night. Will consult with provider. TC to DR AR.Lm to call after surgery complete.

## 2012-06-20 ENCOUNTER — Telehealth: Payer: Self-pay | Admitting: Obstetrics and Gynecology

## 2012-06-20 LAB — URINE CULTURE: Colony Count: 45000

## 2012-06-20 NOTE — Telephone Encounter (Signed)
TC to pt. LM to return call.  

## 2012-06-20 NOTE — Telephone Encounter (Signed)
Returned pt's call. Sched for eval with LC 06/22/12.

## 2012-06-20 NOTE — Telephone Encounter (Signed)
Message copied by Mason Jim on Tue Jun 20, 2012  9:48 AM ------      Message from: Cornelius Moras      Created: Mon Jun 19, 2012  9:04 PM      Regarding: Appt needed this week       Seen tonight for pp HTN--already on Hyzar, added Procardia 30 mg XL daily.  Needs appt on Thursday morning for BP check.      Please call the patient and schedule.      Thanks!      VL ------

## 2012-06-20 NOTE — Telephone Encounter (Signed)
VM from [facility] to verify number to dispense for K-dur.  Two Rx were sent, one for 30 days and one for 7 days.  Per MAU note, to take for 7 days. AJ at pharmacy informed.

## 2012-06-20 NOTE — Telephone Encounter (Signed)
Message copied by Mason Jim on Tue Jun 20, 2012  8:53 AM ------      Message from: Cornelius Moras      Created: Mon Jun 19, 2012  9:04 PM      Regarding: Appt needed this week       Seen tonight for pp HTN--already on Hyzar, added Procardia 30 mg XL daily.  Needs appt on Thursday morning for BP check.      Please call the patient and schedule.      Thanks!      VL ------

## 2012-06-22 ENCOUNTER — Encounter: Payer: Self-pay | Admitting: Family Medicine

## 2012-06-22 ENCOUNTER — Ambulatory Visit: Payer: 59 | Admitting: Family Medicine

## 2012-06-22 VITALS — BP 164/80 | Ht 66.0 in | Wt 250.0 lb

## 2012-06-22 DIAGNOSIS — I10 Essential (primary) hypertension: Secondary | ICD-10-CM

## 2012-06-22 NOTE — Progress Notes (Signed)
Subjective:     Patient ID: Michelle Wilson, female   DOB: [birth year], 37 y.o.   MRN: [id number] BP 164/80  Ht 5\' 6"  (1.676 m)  Wt 250 lb (113.399 kg)  BMI 40.37 kg/m2  LMP 09/01/2011 Repeat BP 152/80   Hypertension This is a chronic problem. Pertinent negatives include no chest pain, headaches, palpitations or shortness of breath. Past treatments include beta blockers, lifestyle changes and alpha 1 blockers. The current treatment provides mild improvement. There are no compliance problems.   Patient reports this medication is working better for her and BP's at home 130'-140's/ 70's-80's.   Review of Systems  HENT: Negative for nosebleeds.   Respiratory: Negative for shortness of breath.   Cardiovascular: Negative for chest pain and palpitations.  Gastrointestinal: Negative for abdominal pain.  Neurological: Negative for dizziness and headaches.       Objective:   Physical Exam  Constitutional: She appears well-developed and well-nourished.  Cardiovascular: Normal rate, regular rhythm and normal heart sounds.   Pulmonary/Chest: Effort normal and breath sounds normal.       Assessment:     1. Hypertension    Plan:    1. Continue Procardia/Hyzaar.    2. Encouraged patient to follow DASH diet and discussed a light walking.   3. Twice a week home BPs with log. Bring to next visit.  Smart nurse will also visit 1-2 days a week.   4. F/u as scheduled in office with Dr. Pennie Rushing.

## 2012-06-22 NOTE — Patient Instructions (Addendum)

## 2012-06-24 DEATH — deceased

## 2012-07-25 ENCOUNTER — Encounter: Payer: Self-pay | Admitting: Obstetrics and Gynecology

## 2012-09-02 NOTE — Progress Notes (Signed)
Patient ID: Michelle Wilson, female   DOB: 05-Aug-1975, 37 y.o.   MRN: [id number] na

## 2012-09-11 ENCOUNTER — Ambulatory Visit (INDEPENDENT_AMBULATORY_CARE_PROVIDER_SITE_OTHER): Payer: 59 | Admitting: Family Medicine

## 2012-09-11 ENCOUNTER — Encounter: Payer: Self-pay | Admitting: Family Medicine

## 2012-09-11 VITALS — BP 174/86 | HR 112 | Resp 18 | Ht 66.0 in | Wt 248.0 lb

## 2012-09-11 DIAGNOSIS — I10 Essential (primary) hypertension: Secondary | ICD-10-CM

## 2012-09-11 DIAGNOSIS — F4323 Adjustment disorder with mixed anxiety and depressed mood: Secondary | ICD-10-CM

## 2012-09-11 MED ORDER — NIFEDIPINE ER OSMOTIC RELEASE 30 MG PO TB24: 30.0000 mg | ORAL_TABLET | Freq: Every day | ORAL | Status: DC

## 2012-09-11 MED ORDER — ALPRAZOLAM 0.5 MG PO TABS: 0.5000 mg | ORAL_TABLET | Freq: Three times a day (TID) | ORAL | Status: DC | PRN

## 2012-09-11 MED ORDER — LOSARTAN POTASSIUM-HCTZ 100-25 MG PO TABS: 1.0000 | ORAL_TABLET | Freq: Every day | ORAL | Status: DC

## 2012-09-11 NOTE — Patient Instructions (Signed)
Start the xanax Continue hyzaar and procardia Check blood pressure in morning after taking hyzaar, wait at least 1 hour  F/U 2 months at [facility]

## 2012-09-12 ENCOUNTER — Encounter: Payer: Self-pay | Admitting: Family Medicine

## 2012-09-12 DIAGNOSIS — F4323 Adjustment disorder with mixed anxiety and depressed mood: Secondary | ICD-10-CM | POA: Insufficient documentation

## 2012-09-12 NOTE — Assessment & Plan Note (Signed)
BP very labile with her emotions, she may have a component of white coat HTN, she is very reliable, will let her trend BP at home, if systolic increases 140- 150's will adjust meds As she has anxiety which raises HR,will continue the procardia with the hyzaar She is not BF

## 2012-09-12 NOTE — Assessment & Plan Note (Signed)
Her mood is more on anxious side instead of a true post partum depression She declines SSRI for treatment Will refill the xanax as this will help, understands proper use and potential for addiction I think this is more of an adjustment period that will improve as her routine improves and she gets settled into work No red flags

## 2012-09-12 NOTE — Progress Notes (Signed)
  Subjective:    Patient ID: Michelle Wilson, female    DOB: [birth year], 37 y.o.   MRN: [id number]  HPI   Pt here to f/u HTN. Chronic HTN, s/p delivery began to have worsened BP problems, was evaluated at [facility] with unremarkable labs no signs of Pre-ecclampsia. Son is now 14 weeks, has a lot of stress, anxiety around her newborn. Worried something may have to him, she is back at work and is stressed she can be at home, feels anxious all the time, was given low dose benzo before which helped. She had difficulty BF which also made her nervous and depressed. Gets nervous about BP as well, especially coming to appointments.Does not feel depressed. BP at home runs 120-130's/ 70-80's   Review of Systemser above  GEN- denies fatigue, fever, weight loss,weakness, recent illness HEENT- denies eye drainage, change in vision, nasal discharge, CVS- denies chest pain, palpitations RESP- denies SOB, cough, wheeze ABD- denies N/V, change in stools, abd pain GU- denies dysuria, hematuria, dribbling, incontinence MSK- denies joint pain, muscle aches, injury Neuro- denies headache, dizziness, syncope, seizure activity       Objective:   Physical Exam  GEN- NAD, alert and oriented x3, repeat BP 168/70 HEENT- PERRL, EOMI, non injected sclera, pink conjunctiva, MMM, oropharynx clear Neck- Supple, no thyromegaly CVS- RRR, no murmur RESP-CTAB EXT- No edema Pulses- Radial, DP- 2+ Psych- not depressed appearing,anxious appearing, no SI, no hallucinations, good eye contact       Assessment & Plan:   Approximately 30 minutes spent with pt > 50% spent on counseling for anxiety, post partum depression

## 2012-09-15 ENCOUNTER — Ambulatory Visit: Payer: 59 | Admitting: Family Medicine

## 2012-11-17 ENCOUNTER — Ambulatory Visit (INDEPENDENT_AMBULATORY_CARE_PROVIDER_SITE_OTHER): Payer: 59 | Admitting: Family Medicine

## 2012-11-17 ENCOUNTER — Encounter: Payer: Self-pay | Admitting: Family Medicine

## 2012-11-17 VITALS — BP 124/72 | HR 88 | Resp 20 | Ht 64.0 in | Wt 251.0 lb

## 2012-11-17 DIAGNOSIS — E669 Obesity, unspecified: Secondary | ICD-10-CM

## 2012-11-17 DIAGNOSIS — I10 Essential (primary) hypertension: Secondary | ICD-10-CM

## 2012-11-17 DIAGNOSIS — F4323 Adjustment disorder with mixed anxiety and depressed mood: Secondary | ICD-10-CM

## 2012-11-17 DIAGNOSIS — E876 Hypokalemia: Secondary | ICD-10-CM

## 2012-11-17 LAB — LIPID PANEL
Cholesterol: 179 mg/dL (ref 0–200)
HDL: 49 mg/dL (ref >39)
Total CHOL/HDL Ratio: 3.7 Ratio
Triglycerides: 66 mg/dL (ref <150)

## 2012-11-17 LAB — CBC
MCV: 79.6 fL (ref 78.0–100.0)
Platelets: 290 10*3/uL (ref 150–400)
RBC: 4.37 MIL/uL (ref 3.87–5.11)
RDW: 14.3 % (ref 11.5–15.5)
WBC: 7.9 10*3/uL (ref 4.0–10.5)

## 2012-11-17 LAB — COMPREHENSIVE METABOLIC PANEL
CO2: 26 mEq/L (ref 19–32)
Calcium: 9.1 mg/dL (ref 8.4–10.5)
Chloride: 101 mEq/L (ref 96–112)
Glucose, Bld: 85 mg/dL (ref 70–99)
Sodium: 138 mEq/L (ref 135–145)
Total Bilirubin: 0.4 mg/dL (ref 0.3–1.2)
Total Protein: 7.4 g/dL (ref 6.0–8.3)

## 2012-11-17 LAB — TSH: TSH: 0.882 u[IU]/mL (ref 0.350–4.500)

## 2012-11-17 MED ORDER — ALPRAZOLAM 0.5 MG PO TABS: 0.5000 mg | ORAL_TABLET | Freq: Three times a day (TID) | ORAL | Status: DC | PRN

## 2012-11-17 NOTE — Patient Instructions (Signed)
We will call with labs Stop the Procardia  Continue xanax  F/U 4 months

## 2012-11-19 MED ORDER — POTASSIUM CHLORIDE CRYS ER 20 MEQ PO TBCR: 20.0000 meq | EXTENDED_RELEASE_TABLET | Freq: Every day | ORAL | Status: DC

## 2012-11-19 NOTE — Assessment & Plan Note (Signed)
Improved, refilled benzo, exercise incorporated to help symptoms No red flags

## 2012-11-19 NOTE — Assessment & Plan Note (Signed)
Continue to encourage activity, meal planning to assist with weight loss

## 2012-11-19 NOTE — Assessment & Plan Note (Signed)
Well controlled, I think we can safely stop the procardia, she will call with BP readings if they elevate Check fasting labs

## 2012-11-19 NOTE — Progress Notes (Signed)
  Subjective:    Patient ID: Michelle Wilson, female    DOB: [birth year], 37 y.o.   MRN: [id number]  HPI  Pt here to f/u HTN. Has been checking BP at home and at work- systolic 120-130/ 80's. Denies CP, HA. Would like to come off her procardia causes some dizzy symptoms and feels light headed after taking. She is still adjusting with her infant and toddler, uses benzo a few times a month, asking for refill.  Has support from husband and family. 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    Review of Systems  GEN- denies fatigue, fever, weight loss,weakness, recent illness HEENT- denies eye drainage, change in vision, nasal discharge, CVS- denies chest pain, palpitations RESP- denies SOB, cough, wheeze Neuro- denies headache, dizziness, syncope, seizure activity      Objective:   Physical Exam GEN- NAD, alert and oriented x3 HEENT- PERRL, EOMI, non injected sclera, pink conjunctiva, MMM, oropharynx clear CVS- RRR, no murmur RESP-CTAB EXT- No edema Pulses- Radial, DP 2+ Psych- normal affect and mood       Assessment & Plan:

## 2012-11-19 NOTE — Assessment & Plan Note (Signed)
RX KCL for 1 week and repeat

## 2012-11-22 ENCOUNTER — Other Ambulatory Visit: Payer: Self-pay | Admitting: Family Medicine

## 2012-11-22 ENCOUNTER — Encounter: Payer: Self-pay | Admitting: Family Medicine

## 2012-11-22 DIAGNOSIS — E876 Hypokalemia: Secondary | ICD-10-CM

## 2012-11-22 DIAGNOSIS — D649 Anemia, unspecified: Secondary | ICD-10-CM

## 2012-12-04 ENCOUNTER — Other Ambulatory Visit: Payer: Self-pay | Admitting: Family Medicine

## 2012-12-05 LAB — IRON AND TIBC
%SAT: 19 % — ABNORMAL LOW (ref 20–55)
TIBC: 312 ug/dL (ref 250–470)

## 2012-12-05 LAB — BASIC METABOLIC PANEL
CO2: 31 mEq/L (ref 19–32)
Chloride: 100 mEq/L (ref 96–112)
Potassium: 4.1 mEq/L (ref 3.5–5.3)
Sodium: 138 mEq/L (ref 135–145)

## 2013-02-01 ENCOUNTER — Other Ambulatory Visit: Payer: Self-pay | Admitting: Family Medicine

## 2013-02-01 NOTE — Telephone Encounter (Signed)
Last refill 7/25  #45 + 1 refill   OK refill?

## 2013-02-02 NOTE — Telephone Encounter (Signed)
rx  Called in  

## 2013-02-02 NOTE — Telephone Encounter (Signed)
Okay to refill? 

## 2013-02-06 ENCOUNTER — Other Ambulatory Visit: Payer: Self-pay | Admitting: Family Medicine

## 2013-02-06 NOTE — Telephone Encounter (Signed)
Meds refilled.

## 2013-03-01 ENCOUNTER — Other Ambulatory Visit: Payer: Self-pay

## 2013-03-16 ENCOUNTER — Ambulatory Visit (INDEPENDENT_AMBULATORY_CARE_PROVIDER_SITE_OTHER): Payer: 59 | Admitting: Family Medicine

## 2013-03-16 ENCOUNTER — Ambulatory Visit: Payer: 59 | Admitting: Family Medicine

## 2013-03-16 ENCOUNTER — Encounter: Payer: Self-pay | Admitting: Family Medicine

## 2013-03-16 VITALS — BP 152/96 | HR 86 | Temp 98.5°F | Resp 18 | Ht 64.5 in | Wt 263.0 lb

## 2013-03-16 DIAGNOSIS — F419 Anxiety disorder, unspecified: Secondary | ICD-10-CM

## 2013-03-16 DIAGNOSIS — F411 Generalized anxiety disorder: Secondary | ICD-10-CM

## 2013-03-16 DIAGNOSIS — I10 Essential (primary) hypertension: Secondary | ICD-10-CM

## 2013-03-16 DIAGNOSIS — E669 Obesity, unspecified: Secondary | ICD-10-CM

## 2013-03-16 MED ORDER — AMLODIPINE BESYLATE 5 MG PO TABS: 5.0000 mg | ORAL_TABLET | Freq: Every day | ORAL | Status: DC

## 2013-03-16 MED ORDER — LOSARTAN POTASSIUM-HCTZ 100-25 MG PO TABS: ORAL_TABLET | ORAL | Status: DC

## 2013-03-16 MED ORDER — ALPRAZOLAM 0.5 MG PO TABS: 0.5000 mg | ORAL_TABLET | Freq: Three times a day (TID) | ORAL | Status: DC | PRN

## 2013-03-16 NOTE — Progress Notes (Signed)
  Subjective:    Patient ID: Michelle Wilson, female    DOB: [birth year], 37 y.o.   MRN: [id number]  HPI Pt here to f/u Medications and HTN, no speciofic concerns Anxiety- she continues to use her Xanax very sparingly approximately 2 times a week. Things are still very active and she does have a 28-month-old at home as well as a toddler. She's also back to work full-time, he denies any depressive symptoms HTN- she's been taking her medication as prescribed. She states that her blood pressure runs in the low one teens to 120s to 30s when it is taken at work. She states it is solid elevated when she comes into our office. She has not been exercising on a regular basis and her diet has also slacked off. She has gained 12 pounds since her last visit   Review of Systems  GEN- denies fatigue, fever, weight loss,weakness, recent illness HEENT- denies eye drainage, change in vision, nasal discharge, CVS- denies chest pain, palpitations RESP- denies SOB, cough, wheeze ABD- denies N/V, change in stools, abd pain GU- denies dysuria, hematuria, dribbling, incontinence MSK- denies joint pain, muscle aches, injury Neuro- denies headache, dizziness, syncope, seizure activity      Objective:   Physical Exam GEN- NAD, alert and oriented x3 HEENT- PERRL, EOMI, non injected sclera, pink conjunctiva, MMM, oropharynx clear CVS- RRR, no murmur RESP-CTAB EXT- No edema Pulses- Radial, DP- 2+ Psych- normal affect and mood       Assessment & Plan:

## 2013-03-16 NOTE — Patient Instructions (Signed)
Start norvasc for blood pressure Continue other medications F/U 2 weeks for BP recheck

## 2013-03-17 ENCOUNTER — Encounter: Payer: Self-pay | Admitting: Family Medicine

## 2013-03-17 NOTE — Assessment & Plan Note (Signed)
While she may have a component of whitecoat hypertension her blood pressure readings are extremely high. I will add on Norvasc 5 mg to her Hyzaar

## 2013-03-17 NOTE — Assessment & Plan Note (Signed)
Discuss her weight gain. She is very committed to losing weight and exercise on regular basis. Distal vastly affect her blood pressure in her overall health.

## 2013-03-17 NOTE — Assessment & Plan Note (Signed)
Continue Xanax which she uses sparingly

## 2013-03-26 ENCOUNTER — Telehealth: Payer: Self-pay | Admitting: Family Medicine

## 2013-03-26 ENCOUNTER — Encounter: Payer: Self-pay | Admitting: Family Medicine

## 2013-03-26 NOTE — Telephone Encounter (Signed)
email was sent today by pt I forwarded over to dr. Jeanice Lim

## 2013-03-26 NOTE — Telephone Encounter (Signed)
Pt is having trouble with the new blood pressure medication that Dr Jeanice Lim had put her on. She states that she sent Dr Jeanice Lim a message on [place] but never heard anything back Call back number is [phone]

## 2013-04-02 ENCOUNTER — Ambulatory Visit: Payer: 59 | Admitting: Family Medicine

## 2013-04-03 ENCOUNTER — Encounter: Payer: Self-pay | Admitting: Internal Medicine

## 2013-04-06 ENCOUNTER — Ambulatory Visit: Payer: 59 | Admitting: Family Medicine

## 2013-04-13 ENCOUNTER — Encounter: Payer: Self-pay | Admitting: Family Medicine

## 2013-04-13 ENCOUNTER — Ambulatory Visit: Payer: 59 | Admitting: Family Medicine

## 2013-05-01 ENCOUNTER — Ambulatory Visit: Payer: 59 | Admitting: Internal Medicine

## 2013-05-04 ENCOUNTER — Encounter: Payer: Self-pay | Admitting: Family Medicine

## 2013-05-04 ENCOUNTER — Ambulatory Visit (INDEPENDENT_AMBULATORY_CARE_PROVIDER_SITE_OTHER): Payer: 59 | Admitting: Family Medicine

## 2013-05-04 VITALS — BP 138/80 | HR 78 | Temp 98.0°F | Resp 18 | Ht 65.5 in | Wt 254.5 lb

## 2013-05-04 DIAGNOSIS — I10 Essential (primary) hypertension: Secondary | ICD-10-CM

## 2013-05-04 DIAGNOSIS — E669 Obesity, unspecified: Secondary | ICD-10-CM

## 2013-05-04 NOTE — Progress Notes (Signed)
   Subjective:    Patient ID: Michelle Wilson, female    DOB: 09-04-1975, 38 y.o.   MRN: [id number]  HPI Patient here to followup hypertension. At last visit she was started on Norvasc 5 mg however had hypotension which she was symptomatic with. She did try taking a half a tablet but eventually discontinued about 2 weeks ago. She has lost 9 pounds since her last visit and is working out 3-4 times a week. She's also change her eating habits. She's been recording her blood pressure at home her systolic ranges 120-130/70 to 13K80s. ( I was able to view her meter today) She's not had any chest pain or shortness of breath.   Review of Systems - per above  GEN- denies fatigue, fever, weight loss,weakness, recent illness HEENT- denies eye drainage, change in vision, nasal discharge, CVS- denies chest pain, palpitations RESP- denies SOB, cough, wheeze Neuro- denies headache, dizziness, syncope, seizure activity       Objective:   Physical Exam GEN- NAD, alert and oriented x3 CVS- RRR, no murmur RESP-CTAB EXT- No edema Pulses- Radial 2+         Assessment & Plan:

## 2013-05-04 NOTE — Patient Instructions (Signed)
Continue current medications Blood pressure looks great  Keep up the good work on the weight! F/U 6 months

## 2013-05-04 NOTE — Assessment & Plan Note (Signed)
Blood pressure is much improved today is significantly better with her weight loss. We'll continue her on just the Hyzaar She will need labs at the next visit

## 2013-05-04 NOTE — Assessment & Plan Note (Signed)
Continue to work on weight loss. Congratulated on her success thus far

## 2013-05-11 ENCOUNTER — Encounter: Payer: Self-pay | Admitting: Family Medicine

## 2013-05-11 ENCOUNTER — Telehealth: Payer: Self-pay | Admitting: Family Medicine

## 2013-05-11 NOTE — Telephone Encounter (Signed)
Pt was her last week pt is having nasal congestion can she having something called in or can recommened something to take over the counter Both of her both boys have had a cold  Call back number is [phone]

## 2013-05-11 NOTE — Telephone Encounter (Signed)
I would recommend Mucinex DM and nasal saline No decongestants because they will run her blood pressure up Put a humidifer in the room

## 2013-05-12 NOTE — Telephone Encounter (Signed)
[facility]

## 2013-05-13 ENCOUNTER — Encounter (HOSPITAL_COMMUNITY): Payer: Self-pay | Admitting: Emergency Medicine

## 2013-05-13 ENCOUNTER — Emergency Department (HOSPITAL_COMMUNITY)
Admission: EM | Admit: 2013-05-13 | Discharge: 2013-05-13 | Disposition: A | Discharge status: Home / Self Care | Payer: 59 | Source: Home / Self Care | Attending: Emergency Medicine | Admitting: Emergency Medicine

## 2013-05-13 DIAGNOSIS — J329 Chronic sinusitis, unspecified: Secondary | ICD-10-CM

## 2013-05-13 DIAGNOSIS — J069 Acute upper respiratory infection, unspecified: Secondary | ICD-10-CM

## 2013-05-13 NOTE — Discharge Instructions (Signed)
Use saline nasal spray several times a day to help relieve the congestion and sinus pressure.    Upper Respiratory Infection, Adult An upper respiratory infection (URI) is also sometimes known as the common cold. The upper respiratory tract includes the nose, sinuses, throat, trachea, and bronchi. Bronchi are the airways leading to the lungs. Most people improve within 1 week, but symptoms can last up to 2 weeks. A residual cough may last even longer.  CAUSES Many different viruses can infect the tissues lining the upper respiratory tract. The tissues become irritated and inflamed and often become very moist. Mucus production is also common. A cold is contagious. You can easily spread the virus to others by oral contact. This includes kissing, sharing a glass, coughing, or sneezing. Touching your mouth or nose and then touching a surface, which is then touched by another person, can also spread the virus. SYMPTOMS  Symptoms typically develop 1 to 3 days after you come in contact with a cold virus. Symptoms vary from person to person. They may include:  Runny nose.  Sneezing.  Nasal congestion.  Sinus irritation.  Sore throat.  Loss of voice (laryngitis).  Cough.  Fatigue.  Muscle aches.  Loss of appetite.  Headache.  Low-grade fever. DIAGNOSIS  You might diagnose your own cold based on familiar symptoms, since most people get a cold 2 to 3 times a year. Your caregiver can confirm this based on your exam. Most importantly, your caregiver can check that your symptoms are not due to another disease such as strep throat, sinusitis, pneumonia, asthma, or epiglottitis. Blood tests, throat tests, and X-rays are not necessary to diagnose a common cold, but they may sometimes be helpful in excluding other more serious diseases. Your caregiver will decide if any further tests are required. RISKS AND COMPLICATIONS  You may be at risk for a more severe case of the common cold if you smoke  cigarettes, have chronic heart disease (such as heart failure) or lung disease (such as asthma), or if you have a weakened immune system. The very young and very old are also at risk for more serious infections. Bacterial sinusitis, middle ear infections, and bacterial pneumonia can complicate the common cold. The common cold can worsen asthma and chronic obstructive pulmonary disease (COPD). Sometimes, these complications can require emergency medical care and may be life-threatening. PREVENTION  The best way to protect against getting a cold is to practice good hygiene. Avoid oral or hand contact with people with cold symptoms. Wash your hands often if contact occurs. There is no clear evidence that vitamin C, vitamin E, echinacea, or exercise reduces the chance of developing a cold. However, it is always recommended to get plenty of rest and practice good nutrition. TREATMENT  Treatment is directed at relieving symptoms. There is no cure. Antibiotics are not effective, because the infection is caused by a virus, not by bacteria. Treatment may include:  Increased fluid intake. Sports drinks offer valuable electrolytes, sugars, and fluids.  Breathing heated mist or steam (vaporizer or shower).  Eating chicken soup or other clear broths, and maintaining good nutrition.  Getting plenty of rest.  Using gargles or lozenges for comfort.  Controlling fevers with ibuprofen or acetaminophen as directed by your caregiver.  Increasing usage of your inhaler if you have asthma. Zinc gel and zinc lozenges, taken in the first 24 hours of the common cold, can shorten the duration and lessen the severity of symptoms. Pain medicines may help with fever, muscle  aches, and throat pain. A variety of non-prescription medicines are available to treat congestion and runny nose. Your caregiver can make recommendations and may suggest nasal or lung inhalers for other symptoms.  HOME CARE INSTRUCTIONS   Only take  over-the-counter or prescription medicines for pain, discomfort, or fever as directed by your caregiver.  Use a warm mist humidifier or inhale steam from a shower to increase air moisture. This may keep secretions moist and make it easier to breathe.  Drink enough water and fluids to keep your urine clear or pale yellow.  Rest as needed.  Return to work when your temperature has returned to normal or as your caregiver advises. You may need to stay home longer to avoid infecting others. You can also use a face mask and careful hand washing to prevent spread of the virus. SEEK MEDICAL CARE IF:   After the first few days, you feel you are getting worse rather than better.  You need your caregiver's advice about medicines to control symptoms.  You develop chills, worsening shortness of breath, or brown or red sputum. These may be signs of pneumonia.  You develop yellow or brown nasal discharge or pain in the face, especially when you bend forward. These may be signs of sinusitis.  You develop a fever, swollen neck glands, pain with swallowing, or white areas in the back of your throat. These may be signs of strep throat. SEEK IMMEDIATE MEDICAL CARE IF:   You have a fever.  You develop severe or persistent headache, ear pain, sinus pain, or chest pain.  You develop wheezing, a prolonged cough, cough up blood, or have a change in your usual mucus (if you have chronic lung disease).  You develop sore muscles or a stiff neck. Document Released: 10/06/2000 Document Revised: 07/05/2011 Document Reviewed: 08/14/2010 [facility] Patient Information 2014 [place], [place].  Sinusitis Sinusitis is redness, soreness, and puffiness (inflammation) of the air pockets in the bones of your face (sinuses). The redness, soreness, and puffiness can cause air and mucus to get trapped in your sinuses. This can allow germs to grow and cause an infection.  HOME CARE   Drink enough fluids to keep your pee (urine)  clear or pale yellow.  Use a humidifier in your home.  Run a hot shower to create steam in the bathroom. Sit in the bathroom with the door closed. Breathe in the steam 3 4 times a day.  Put a warm, moist washcloth on your face 3 4 times a day, or as told by your doctor.  Use salt water sprays (saline sprays) to wet the thick fluid in your nose. This can help the sinuses drain.  Only take medicine as told by your doctor. GET HELP RIGHT AWAY IF:   Your pain gets worse.  You have very bad headaches.  You are sick to your stomach (nauseous).  You throw up (vomit).  You are very sleepy (drowsy) all the time.  Your face is puffy (swollen).  Your vision changes.  You have a stiff neck.  You have trouble breathing. MAKE SURE YOU:   Understand these instructions.  Will watch your condition.  Will get help right away if you are not doing well or get worse. Document Released: 09/29/2007 Document Revised: 01/05/2012 Document Reviewed: 11/16/2011 [facility] Patient Information 2014 [place], [place].

## 2013-05-13 NOTE — ED Notes (Addendum)
C/o facial pain, concerned about sinus infection vs influenza; has small children at home. Started augmentin last pm by CIO from her PCP w/o exam

## 2013-05-13 NOTE — ED Provider Notes (Signed)
Medical screening examination/treatment/procedure(s) were performed by non-physician practitioner and as supervising physician I was immediately available for consultation/collaboration.  Jaylene Arrowood, M.D.  Evett Kassa C Charlesa Ehle, MD 05/13/13 2138 

## 2013-05-13 NOTE — ED Provider Notes (Signed)
CSN: [id number]     Arrival date & time 05/13/13  1458 History   First MD Initiated Contact with Patient 05/13/13 1624     Chief Complaint  Patient presents with  . Cough   (Consider location/radiation/quality/duration/timing/severity/associated sxs/prior Treatment) HPI Comments: Pt's children were sick with similar sx earlier this week before pt became ill.  Pt called her pcp 2 days ago to report sx and pcp rx augmentin. Pt has had 3 doses. Pt here because she is worried she has influenza.  Pt had a fever of 99.5 2 nights ago. Reports nasal discharge is clear  Patient is a 38 y.o. female presenting with URI. The history is provided by the patient.  URI Presenting symptoms: congestion, fever, rhinorrhea and sore throat   Presenting symptoms: no cough   Severity:  Moderate Onset quality:  Gradual Duration:  5 days Timing:  Constant Progression:  Unchanged Chronicity:  New Relieved by:  Nothing Worsened by:  Certain positions Ineffective treatments:  Prescription medications Associated symptoms: sinus pain   Associated symptoms: no myalgias   Risk factors: sick contacts     Past Medical History  Diagnosis Date  . Hypertension     Aldomet 250 mg Q day  . Infection     Yeast inf; no frequent  . IBS (irritable bowel syndrome)     no issues currently  . Anemia   . GERD (gastroesophageal reflux disease)     pregnancy   Past Surgical History  Procedure Laterality Date  . Breast reduction surgery  1997  . Cesarean section  2011  . Bilateral salpingectomy  05/31/2012    Procedure: BILATERAL SALPINGECTOMY;  Surgeon: Hal MoralesVanessa P Haygood, MD;  Location: [facility];  Service: Obstetrics;  Laterality: Bilateral;  . Cesarean section  05/31/2012    Procedure: CESAREAN SECTION;  Surgeon: Hal MoralesVanessa P Haygood, MD;  Location: [facility];  Service: Obstetrics;  Laterality: N/A;   Family History  Problem Relation Age of Onset  . Hypertension Mother   . Hypertension Father   . Hypertension Maternal  Grandmother   . Hypertension Maternal Grandfather   . Hypertension Paternal Grandmother   . Hypertension Paternal Grandfather   . Hyperthyroidism Maternal Aunt     x 3  . Stroke Maternal Grandfather   . Schizophrenia Maternal Aunt   . Cancer Maternal Uncle     Lung deceased   History  Substance Use Topics  . Smoking status: Never Smoker   . Smokeless tobacco: Never Used  . Alcohol Use: No     Comment: Occasionally, not while pregnant   OB History   Grav Para Term Preterm Abortions TAB SAB Ect Mult Living   6 2 2  3  3   2      Review of Systems  Constitutional: Positive for fever. Negative for chills.  HENT: Positive for congestion, postnasal drip, rhinorrhea, sinus pressure and sore throat.   Respiratory: Negative for cough.   Musculoskeletal: Negative for myalgias.    Allergies  Review of patient's allergies indicates no known allergies.  Home Medications   Current Outpatient Rx  Name  Route  Sig  Dispense  Refill  . amoxicillin-clavulanate (AUGMENTIN) 875-125 MG per tablet   Oral   Take 1 tablet by mouth 2 (two) times daily.         Marland Kitchen. ALPRAZolam (XANAX) 0.5 MG tablet   Oral   Take 1 tablet (0.5 mg total) by mouth 3 (three) times daily as needed for anxiety.   45 tablet  1   . losartan-hydrochlorothiazide (HYZAAR) 100-25 MG per tablet      TAKE 1 TABLET BY MOUTH EVERY DAY   90 tablet   1   . Prenatal Vit-Fe Sulfate-FA (PRENATAL VITAMIN PO)   Oral   Take 1 tablet by mouth daily.          BP 124/67  Pulse 81  Temp(Src) 98 F (36.7 C) (Oral)  Resp 16  SpO2 100%  LMP 05/01/2013 Physical Exam  Constitutional: She appears well-developed and well-nourished. No distress.  HENT:  Right Ear: External ear and ear canal normal. A middle ear effusion is present.  Left Ear: External ear and ear canal normal. A middle ear effusion is present.  Nose: Mucosal edema and rhinorrhea present. Right sinus exhibits maxillary sinus tenderness. Right sinus exhibits  no frontal sinus tenderness. Left sinus exhibits maxillary sinus tenderness. Left sinus exhibits no frontal sinus tenderness.  Mouth/Throat: Oropharynx is clear and moist and mucous membranes are normal.  Cardiovascular: Normal rate and regular rhythm.   Pulmonary/Chest: Effort normal and breath sounds normal.  Lymphadenopathy:       Head (right side): No submental, no submandibular and no tonsillar adenopathy present.       Head (left side): No submental, no submandibular and no tonsillar adenopathy present.    She has no cervical adenopathy.    ED Course  Procedures (including critical care time) Labs Review Labs Reviewed - No data to display Imaging Review No results found.  EKG Interpretation    Date/Time:    Ventricular Rate:    PR Interval:    QRS Duration:   QT Interval:    QTC Calculation:   R Axis:     Text Interpretation:              MDM   1. URI (upper respiratory infection)   2. Sinusitis   worried well pt. Likely has uri, possibly viral sinusitis. Recommended saline nasal spray to help relieve congestion. Reassured pt she does not have influenza.     Cathlyn Parsons, NP 05/13/13 [phone]

## 2013-05-14 ENCOUNTER — Telehealth: Payer: Self-pay | Admitting: Family Medicine

## 2013-05-14 NOTE — Telephone Encounter (Signed)
Pt called concern for devloping sinusitis which she has had before, has been sick all week, +sick contacts with children Drainage now green with odor and pain and tenderness in face RX augmentin, continue mucinex, nasal saline

## 2013-05-17 ENCOUNTER — Encounter: Payer: Self-pay | Admitting: Family Medicine

## 2013-05-18 ENCOUNTER — Other Ambulatory Visit: Payer: Self-pay | Admitting: Family Medicine

## 2013-05-18 MED ORDER — FLUCONAZOLE 150 MG PO TABS: 150.0000 mg | ORAL_TABLET | Freq: Once | ORAL | Status: DC

## 2013-05-24 ENCOUNTER — Telehealth: Payer: Self-pay | Admitting: *Deleted

## 2013-05-24 NOTE — Telephone Encounter (Signed)
[facility], she is needing refill on Xanax 0.5mg 

## 2013-05-25 MED ORDER — ALPRAZOLAM 0.5 MG PO TABS: 0.5000 mg | ORAL_TABLET | Freq: Three times a day (TID) | ORAL | Status: DC | PRN

## 2013-05-25 NOTE — Telephone Encounter (Signed)
Med phoned in °

## 2013-05-25 NOTE — Telephone Encounter (Signed)
Okay to refill? 

## 2013-09-04 ENCOUNTER — Other Ambulatory Visit: Payer: Self-pay | Admitting: Family Medicine

## 2013-09-04 NOTE — Telephone Encounter (Signed)
Refill appropriate and filled per protocol. 

## 2013-11-02 ENCOUNTER — Ambulatory Visit: Payer: 59 | Admitting: Family Medicine

## 2013-11-30 ENCOUNTER — Ambulatory Visit (INDEPENDENT_AMBULATORY_CARE_PROVIDER_SITE_OTHER): Payer: 59 | Admitting: Family Medicine

## 2013-11-30 ENCOUNTER — Encounter: Payer: Self-pay | Admitting: Family Medicine

## 2013-11-30 VITALS — BP 138/88 | HR 68 | Temp 98.4°F | Resp 12 | Ht 65.0 in | Wt 255.0 lb

## 2013-11-30 DIAGNOSIS — F419 Anxiety disorder, unspecified: Secondary | ICD-10-CM

## 2013-11-30 DIAGNOSIS — I10 Essential (primary) hypertension: Secondary | ICD-10-CM

## 2013-11-30 DIAGNOSIS — E669 Obesity, unspecified: Secondary | ICD-10-CM

## 2013-11-30 DIAGNOSIS — F411 Generalized anxiety disorder: Secondary | ICD-10-CM

## 2013-11-30 LAB — CBC WITH DIFFERENTIAL/PLATELET
BASOS ABS: 0 10*3/uL (ref 0.0–0.1)
BASOS PCT: 0 % (ref 0–1)
Eosinophils Absolute: 0.1 10*3/uL (ref 0.0–0.7)
Eosinophils Relative: 1 % (ref 0–5)
HEMATOCRIT: 33.9 % — AB (ref 36.0–46.0)
Hemoglobin: 11.6 g/dL — ABNORMAL LOW (ref 12.0–15.0)
Lymphocytes Relative: 33 % (ref 12–46)
Lymphs Abs: 2.5 10*3/uL (ref 0.7–4.0)
MCH: 26.3 pg (ref 26.0–34.0)
MCHC: 34.2 g/dL (ref 30.0–36.0)
MCV: 76.9 fL — ABNORMAL LOW (ref 78.0–100.0)
MONO ABS: 0.4 10*3/uL (ref 0.1–1.0)
Monocytes Relative: 5 % (ref 3–12)
NEUTROS ABS: 4.7 10*3/uL (ref 1.7–7.7)
NEUTROS PCT: 61 % (ref 43–77)
Platelets: 382 10*3/uL (ref 150–400)
RBC: 4.41 MIL/uL (ref 3.87–5.11)
RDW: 14.8 % (ref 11.5–15.5)
WBC: 7.7 10*3/uL (ref 4.0–10.5)

## 2013-11-30 NOTE — Patient Instructions (Signed)
F/U 3 months for GYN physical with PAP  We will call with lab results

## 2013-11-30 NOTE — Assessment & Plan Note (Signed)
Her anxiety is much improved she has decreased the use of her Xanax we will go ahead and refill this today

## 2013-11-30 NOTE — Progress Notes (Signed)
Patient ID: Michelle Wilson, female   DOB: [birth year], 38 y.o.   MRN: [id number]   Subjective:    Patient ID: Michelle Booksamille M Wilson, female    DOB: [birth year], 38 y.o.   MRN: [id number]  Patient presents for 6 month F/U  patient here to follow chronic medical problems. She's no specific concerns today. She is in a weight loss challenge with her job and has lost 8 pounds. She said her weight actually went up to 263 pounds. She's taken her blood pressure medication as prescribed her home readings are 118 to 120s over 70s. No chest pain no shortness of breath no headaches.    Review Of Systems:  GEN- denies fatigue, fever, weight loss,weakness, recent illness HEENT- denies eye drainage, change in vision, nasal discharge, CVS- denies chest pain, palpitations RESP- denies SOB, cough, wheeze Neuro- denies headache, dizziness, syncope, seizure activity       Objective:    BP 138/88  Pulse 68  Temp(Src) 98.4 F (36.9 C) (Oral)  Resp 12  Ht 5\' 5"  (1.651 m)  Wt 255 lb (115.667 kg)  BMI 42.43 kg/m2  LMP 11/29/2013  Breastfeeding? No GEN- NAD, alert and oriented x3 HEENT- PERRL, EOMI, non injected sclera, pink conjunctiva, MMM, oropharynx clear CVS- RRR, no murmur RESP-CTAB EXT- No edema Pulses- Radial, DP- 2+        Assessment & Plan:      Problem List Items Addressed This Visit   Obesity   Essential hypertension, benign - Primary   Relevant Orders      CBC with Differential      Comprehensive metabolic panel      Lipid panel      Note: This dictation was prepared with Dragon dictation along with smaller phrase technology. Any transcriptional errors that result from this process are unintentional.

## 2013-11-30 NOTE — Assessment & Plan Note (Signed)
Blood pressure is well-controlled we'll continue at the current dose. Fasting labs obtained

## 2013-11-30 NOTE — Assessment & Plan Note (Signed)
She will continue with the weight loss program with her job she is change her eating habits she is on the [employer] [employer] diet exercises 3-5 times a week

## 2013-12-01 LAB — COMPREHENSIVE METABOLIC PANEL
ALBUMIN: 4.1 g/dL (ref 3.5–5.2)
ALK PHOS: 96 U/L (ref 39–117)
ALT: 20 U/L (ref 0–35)
AST: 17 U/L (ref 0–37)
BUN: 12 mg/dL (ref 6–23)
CO2: 29 mEq/L (ref 19–32)
Calcium: 9.4 mg/dL (ref 8.4–10.5)
Chloride: 99 mEq/L (ref 96–112)
Creat: 0.8 mg/dL (ref 0.50–1.10)
Glucose, Bld: 82 mg/dL (ref 70–99)
POTASSIUM: 3.6 meq/L (ref 3.5–5.3)
Sodium: 139 mEq/L (ref 135–145)
Total Bilirubin: 0.4 mg/dL (ref 0.2–1.2)
Total Protein: 7.4 g/dL (ref 6.0–8.3)

## 2013-12-01 LAB — LIPID PANEL
CHOL/HDL RATIO: 4.6 ratio
Cholesterol: 181 mg/dL (ref 0–200)
HDL: 39 mg/dL — AB (ref >39)
LDL CALC: 122 mg/dL — AB (ref 0–99)
Triglycerides: 99 mg/dL (ref <150)
VLDL: 20 mg/dL (ref 0–40)

## 2013-12-26 ENCOUNTER — Other Ambulatory Visit: Payer: Self-pay | Admitting: *Deleted

## 2013-12-26 MED ORDER — LOSARTAN POTASSIUM-HCTZ 100-25 MG PO TABS: ORAL_TABLET | ORAL | Status: DC

## 2013-12-26 NOTE — Telephone Encounter (Signed)
Received fax requesting refill on Hyzaar.   Refill appropriate and filled per protocol.   

## 2014-02-25 ENCOUNTER — Encounter: Payer: Self-pay | Admitting: Family Medicine

## 2014-03-01 ENCOUNTER — Encounter: Payer: 59 | Admitting: Family Medicine

## 2014-03-25 ENCOUNTER — Ambulatory Visit (INDEPENDENT_AMBULATORY_CARE_PROVIDER_SITE_OTHER): Payer: 59 | Admitting: Family Medicine

## 2014-03-25 ENCOUNTER — Encounter: Payer: Self-pay | Admitting: Family Medicine

## 2014-03-25 ENCOUNTER — Ambulatory Visit (HOSPITAL_COMMUNITY)
Admission: RE | Admit: 2014-03-25 | Discharge: 2014-03-25 | Disposition: A | Discharge status: Home / Self Care | Payer: 59 | Source: Ambulatory Visit | Attending: Family Medicine | Admitting: Family Medicine

## 2014-03-25 DIAGNOSIS — M549 Dorsalgia, unspecified: Secondary | ICD-10-CM | POA: Diagnosis not present

## 2014-03-25 DIAGNOSIS — M546 Pain in thoracic spine: Secondary | ICD-10-CM

## 2014-03-25 DIAGNOSIS — F419 Anxiety disorder, unspecified: Secondary | ICD-10-CM

## 2014-03-25 DIAGNOSIS — I10 Essential (primary) hypertension: Secondary | ICD-10-CM

## 2014-03-25 DIAGNOSIS — J0100 Acute maxillary sinusitis, unspecified: Secondary | ICD-10-CM

## 2014-03-25 MED ORDER — AZITHROMYCIN 250 MG PO TABS: ORAL_TABLET | ORAL | Status: DC

## 2014-03-25 MED ORDER — LOSARTAN POTASSIUM-HCTZ 100-25 MG PO TABS: ORAL_TABLET | ORAL | Status: DC

## 2014-03-25 MED ORDER — ALPRAZOLAM 0.5 MG PO TABS: 0.5000 mg | ORAL_TABLET | Freq: Three times a day (TID) | ORAL | Status: DC | PRN

## 2014-03-25 MED ORDER — METHYLPREDNISOLONE ACETATE 40 MG/ML IJ SUSP
40.0000 mg | Freq: Once | INTRAMUSCULAR | Status: AC
  Administered 2014-03-25: 40 mg via INTRAMUSCULAR

## 2014-03-25 NOTE — Addendum Note (Signed)
Addended by: Phillips OdorSIX, Jaivion Kingsley H on: 03/25/2014 05:27 PM   Modules accepted: Orders

## 2014-03-25 NOTE — Assessment & Plan Note (Signed)
Well=controllled 
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
## 2014-03-25 NOTE — Patient Instructions (Signed)
Use heating pad Ibuprofen 600mg  with food every 6 hours Get xray of back Call if not improving CHANGE F/U TO 4 MONTHS

## 2014-03-26 ENCOUNTER — Encounter: Payer: Self-pay | Admitting: Family Medicine

## 2014-03-27 ENCOUNTER — Other Ambulatory Visit: Payer: Self-pay | Admitting: Family Medicine

## 2014-03-27 DIAGNOSIS — S161XXA Strain of muscle, fascia and tendon at neck level, initial encounter: Secondary | ICD-10-CM

## 2014-03-27 DIAGNOSIS — M546 Pain in thoracic spine: Secondary | ICD-10-CM

## 2014-03-28 ENCOUNTER — Ambulatory Visit (HOSPITAL_COMMUNITY)
Admission: RE | Admit: 2014-03-28 | Discharge: 2014-03-28 | Disposition: A | Discharge status: Home / Self Care | Payer: No Typology Code available for payment source | Source: Ambulatory Visit | Attending: Family Medicine | Admitting: Family Medicine

## 2014-03-28 DIAGNOSIS — M542 Cervicalgia: Secondary | ICD-10-CM | POA: Diagnosis not present

## 2014-03-28 DIAGNOSIS — R531 Weakness: Secondary | ICD-10-CM

## 2014-03-28 DIAGNOSIS — S161XXA Strain of muscle, fascia and tendon at neck level, initial encounter: Secondary | ICD-10-CM | POA: Insufficient documentation

## 2014-03-28 DIAGNOSIS — M546 Pain in thoracic spine: Secondary | ICD-10-CM | POA: Insufficient documentation
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
## 2014-03-28 NOTE — Patient Instructions (Signed)
Strengthening: Extension - Resisted   Facing forward, fingertips on back of head, bend head backward. Give light resistance. Repeat ____ times per set. Do ____ sets per session. Do ____ sessions per day.  http://orth.exer.us/330   Copyright  VHI. All rights reserved.  Strengthening: Lateral Flexion - Resisted, Beginning to End Range   Facing forward, fingertips on right temple, head side bent away, tilt head back toward other shoulder. Give light resistance. Hold ____ seconds. Repeat ____ times per set. Do ____ sets per session. Do ____ sessions per day.  http://orth.exer.us/334   Copyright  VHI. All rights reserved.  Flexibility: Neck Retraction   Pull head straight back, keeping eyes and jaw level. Repeat ____ times per set. Do ____ sets per session. Do ____ sessions per day.  http://orth.exer.us/344   Copyright  VHI. All rights reserved.

## 2014-03-29 ENCOUNTER — Encounter: Payer: 59 | Admitting: Family Medicine

## 2014-04-01 ENCOUNTER — Encounter (HOSPITAL_COMMUNITY): Payer: Self-pay

## 2014-04-01 ENCOUNTER — Ambulatory Visit (HOSPITAL_COMMUNITY)
Admission: RE | Admit: 2014-04-01 | Discharge: 2014-04-01 | Disposition: A | Discharge status: Home / Self Care | Payer: No Typology Code available for payment source | Source: Ambulatory Visit | Attending: Family Medicine | Admitting: Family Medicine

## 2014-04-01 DIAGNOSIS — M542 Cervicalgia: Secondary | ICD-10-CM

## 2014-04-01 DIAGNOSIS — S161XXA Strain of muscle, fascia and tendon at neck level, initial encounter: Secondary | ICD-10-CM | POA: Diagnosis not present

## 2014-04-01 DIAGNOSIS — R531 Weakness: Secondary | ICD-10-CM

## 2014-04-01 NOTE — Addendum Note (Signed)
Encounter addended by: Bella Kennedyynthia J Cassadee Vanzandt, PT on: 04/01/2014  4:04 PM<BR>     Documentation filed: Orders, BPA Follow-up Actions

## 2014-04-01 NOTE — Therapy (Signed)
[facility] [address] [place], [place], [address] Phone: [phone]   Fax:  [phone]  Physical Therapy Treatment  Patient Details  Name: Michelle [facility] MRN: [id number] Date of Birth: [birth year]  Encounter Date: 04/01/2014      PT End of Session - 04/01/14 1531    Visit Number 2   Number of Visits 8   Date for PT Re-Evaluation 04/27/14   Authorization Type UMR   PT Start Time 1348   PT Stop Time 1430   PT Time Calculation (min) 42 min   Activity Tolerance Patient tolerated treatment well   Behavior During Therapy [facility] for tasks assessed/performed      Past Medical History  Diagnosis Date  . Hypertension     Aldomet 250 mg Q day  . Infection     Yeast inf; no frequent  . IBS (irritable bowel syndrome)     no issues currently  . Anemia   . GERD (gastroesophageal reflux disease)     pregnancy    Past Surgical History  Procedure Laterality Date  . Breast reduction surgery  1997  . Cesarean section  2011  . Bilateral salpingectomy  05/31/2012    Procedure: BILATERAL SALPINGECTOMY;  Surgeon: Hal MoralesVanessa P Haygood, MD;  Location: [facility];  Service: Obstetrics;  Laterality: Bilateral;  . Cesarean section  05/31/2012    Procedure: CESAREAN SECTION;  Surgeon: Hal MoralesVanessa P Haygood, MD;  Location: [facility];  Service: Obstetrics;  Laterality: N/A;    LMP 03/13/2014 (Exact Date)  Visit Diagnosis:  Cervical strain, acute, initial encounter  Cervical pain  Decreased strength      Subjective Assessment - 04/01/14 1355    Symptoms Pt stated she is compliant with HEP, pain free currently.  Pain intermittent usually increased with sitting for long hours   Currently in Pain? No/denies            [facility] - 04/01/14 1407    Exercises   Exercises Neck   Neck Exercises: Stretches   Upper Trapezius Stretch 3 reps;30 seconds   Levator Stretch 3 reps;30 seconds   Other Neck Stretches mid trap stretch 3x 30"   Neck  Exercises: Machines for Strengthening   UBE (Upper Arm Bike) 5' backwards L1 with good posture   Neck Exercises: Theraband   Scapula Retraction Green;10 reps   Shoulder Extension 10 reps;Green   Rows 10 reps;Green   Neck Exercises: Seated   Neck Retraction 10 reps;5 secs   Cervical Rotation Both;10 reps   Cervical Rotation Limitations 3D Cervical excursion   Lateral Flexion Both;10 reps   Lateral Flexion Limitations 3D cervical excursion   Shoulder Rolls Backwards;10 reps   Manual Therapy   Manual Therapy Massage   Massage cervical and upper traps Bil             PT Short Term Goals - 04/01/14 1614    PT SHORT TERM GOAL #1   Title Pt to be I in HEP   Status On-going   PT SHORT TERM GOAL #2   Title Pt ROM to be wnl  for better cervical motion    Status On-going   PT SHORT TERM GOAL #3   Title Pt pain to be no grater than a 3/5 80% of the time   Status On-going   PT SHORT TERM GOAL #4   Title strength to be improved to a 4/5 to allow pain level to go down    Status On-going  PT Long Term Goals - 04/01/14 1616    PT LONG TERM GOAL #1   Title Pt to be I in advanced HEP   PT LONG TERM GOAL #2   Title Pain level to be no greater than a 1/0   PT LONG TERM GOAL #3   Title Pt to be able to sit for 2 hours without pain   PT LONG TERM GOAL #4   Title Pt to be able to walk for an hour without increased pain    PT LONG TERM GOAL #5   Title strength to be 5/5 to allow pt to return to previous functional level           Plan - 04/01/14 1611    Pt will benefit from skilled therapeutic intervention in order to improve on the following deficits Pain;Decreased strength   Rehab Potential Good   PT Frequency 2x / week   PT Duration 4 weeks   PT Treatment/Interventions Manual techniques;Patient/family education;Therapeutic exercise;Therapeutic activities   PT Next Visit Plan begin t-band exercise, cervcial retraction as well as UBE may progress to w-back, x to v and  prone exercises as pt tolerates    PT Home Exercise Plan given   Consulted and Agree with Plan of Care Patient      Problem List Patient Active Problem List   Diagnosis Date Noted  . Adjustment disorder with mixed anxiety and depressed mood 09/12/2012  . Hypokalemia 06/19/2012  . Status post repeat low transverse cesarean section 06/02/2012  . Hordeolum externum of left upper eyelid 02/29/2012  . Nasal congestion with rhinorrhea 02/29/2012  . Essential hypertension, benign 08/19/2011  . Obesity 08/19/2011  . Anxiety 08/19/2011  . Seasonal allergies 08/19/2011   Becky Saxasey Gerrett Loman, LPTA [phone]  Juel BurrowCockerham, Jasline Buskirk Jo 04/01/2014, 4:17 PM

## 2014-04-01 NOTE — Addendum Note (Signed)
Encounter addended by: Bella Kennedyynthia J Nathanal Hermiz, PT on: 04/01/2014  4:18 PM<BR>     Documentation filed: Clinical Notes, Flowsheet VN

## 2014-04-04 NOTE — Addendum Note (Signed)
Encounter addended by: Bella Kennedyynthia J Russell, PT on: 04/04/2014  6:41 PM<BR>     Documentation filed: Clinical Notes, Flowsheet VN

## 2014-04-04 NOTE — Addendum Note (Signed)
Encounter addended by: Bella Kennedyynthia J Demyan Fugate, PT on: 04/04/2014  7:55 PM<BR>     Documentation filed: Clinical Notes

## 2014-04-04 NOTE — Addendum Note (Signed)
Encounter addended by: Bella Kennedyynthia J Russell, PT on: 04/04/2014  8:06 PM<BR>     Documentation filed: Clinical Notes

## 2014-04-05 ENCOUNTER — Ambulatory Visit (HOSPITAL_COMMUNITY): Payer: No Typology Code available for payment source | Admitting: Physical Therapy

## 2014-04-05 NOTE — Addendum Note (Signed)
Encounter addended by: Bella Kennedyynthia J Boyd Litaker, PT on: 04/05/2014  3:19 PM<BR>     Documentation filed: Clinical Notes, Orders

## 2014-04-08 ENCOUNTER — Ambulatory Visit (HOSPITAL_COMMUNITY)
Admission: RE | Admit: 2014-04-08 | Discharge: 2014-04-08 | Disposition: A | Discharge status: Home / Self Care | Payer: No Typology Code available for payment source | Source: Ambulatory Visit | Attending: Family Medicine | Admitting: Family Medicine

## 2014-04-08 DIAGNOSIS — R531 Weakness: Secondary | ICD-10-CM

## 2014-04-08 DIAGNOSIS — S161XXA Strain of muscle, fascia and tendon at neck level, initial encounter: Secondary | ICD-10-CM

## 2014-04-08 DIAGNOSIS — M542 Cervicalgia: Secondary | ICD-10-CM

## 2014-04-08 NOTE — Therapy (Signed)
[facility] [address] [place], [place], [address] Phone: [phone]   Fax:  [phone]  Physical Therapy Treatment  Patient Details  Name: Jennings BooksCamille M Rosenau MRN: [id number] Date of Birth: 06-Aug-1975  Encounter Date: 04/08/2014      PT End of Session - 04/08/14 1500    Visit Number 3   Number of Visits 8   Authorization Type UMR   PT Start Time 1350   PT Stop Time 1430   PT Time Calculation (min) 40 min      Past Medical History  Diagnosis Date  . Hypertension     Aldomet 250 mg Q day  . Infection     Yeast inf; no frequent  . IBS (irritable bowel syndrome)     no issues currently  . Anemia   . GERD (gastroesophageal reflux disease)     pregnancy    Past Surgical History  Procedure Laterality Date  . Breast reduction surgery  1997  . Cesarean section  2011  . Bilateral salpingectomy  05/31/2012    Procedure: BILATERAL SALPINGECTOMY;  Surgeon: Hal MoralesVanessa P Haygood, MD;  Location: [facility];  Service: Obstetrics;  Laterality: Bilateral;  . Cesarean section  05/31/2012    Procedure: CESAREAN SECTION;  Surgeon: Hal MoralesVanessa P Haygood, MD;  Location: [facility];  Service: Obstetrics;  Laterality: N/A;    LMP 03/13/2014 (Exact Date)  Visit Diagnosis:  Cervical strain, acute, initial encounter  Cervical pain  Decreased strength      Subjective Assessment - 04/08/14 1404    Symptoms Pt reports it is overall             [facility] Adult PT Treatment/Exercise - 04/08/14 1434    Neck Exercises: Machines for Strengthening   UBE (Upper Arm Bike) 5' backwards L1 with good posture   Neck Exercises: Theraband   Scapula Retraction Green;10 reps   Shoulder Extension 10 reps;Green   Rows 10 reps;Green   Neck Exercises: Prone   Shoulder Extension 10 reps   Rows 10 reps   Upper Extremity Flexion with Stabilization 10 reps   Manual Therapy   Manual Therapy Massage   Massage cervical and upper thoracic Lt in prone            PT Short Term  Goals - 04/08/14 1456    PT SHORT TERM GOAL #1   Title Pt to be I in HEP   Status On-going   PT SHORT TERM GOAL #2   Title Pt ROM to be wnl  for better cervical motion    Status On-going   PT SHORT TERM GOAL #3   Title Pt pain to be no grater than a 3/5 80% of the time   Status On-going   PT SHORT TERM GOAL #4   Title strength to be improved to a 4/5 to allow pain level to go down    Status On-going          PT Long Term Goals - 04/08/14 1456    PT LONG TERM GOAL #1   Title Pt to be I in advanced HEP   PT LONG TERM GOAL #2   Title Pain level to be no greater than a 1/0   PT LONG TERM GOAL #3   Title Pt to be able to sit for 2 hours without pain   PT LONG TERM GOAL #4   Title Pt to be able to walk for an hour without increased pain    PT LONG TERM  GOAL #5   Title strength to be 5/5 to allow pt to return to previous functional level           Plan - 04/08/14 1456    Clinical Impression Statement Progressed to prone exercises today.  Pt able to complete with noted weakness in Lt UE as compared to Rt UE.  Minimal cues needed with theraband exercises as patient completed in good posture and form.  Focused massage to Lt cervical and thoracic region.  No noted spasms, only general tightness.  pt reported overall improvement at end of session.     PT Next Visit Plan Continue to progress toward goals continuing postural strengthening and education, modalities as needed.           Problem List Patient Active Problem List   Diagnosis Date Noted  . Adjustment disorder with mixed anxiety and depressed mood 09/12/2012  . Hypokalemia 06/19/2012  . Status post repeat low transverse cesarean section 06/02/2012  . Hordeolum externum of left upper eyelid 02/29/2012  . Nasal congestion with rhinorrhea 02/29/2012  . Essential hypertension, benign 08/19/2011  . Obesity 08/19/2011  . Anxiety 08/19/2011  . Seasonal allergies 08/19/2011    Lurena Nidamy B Eoin Willden,  PTA/CLT [phone] 04/08/2014, 3:04 PM

## 2014-04-10 ENCOUNTER — Ambulatory Visit (HOSPITAL_COMMUNITY)
Admission: RE | Admit: 2014-04-10 | Discharge: 2014-04-10 | Disposition: A | Discharge status: Home / Self Care | Payer: No Typology Code available for payment source | Source: Ambulatory Visit | Attending: Family Medicine | Admitting: Family Medicine

## 2014-04-10 ENCOUNTER — Encounter (HOSPITAL_COMMUNITY): Payer: Self-pay

## 2014-04-10 DIAGNOSIS — S161XXA Strain of muscle, fascia and tendon at neck level, initial encounter: Secondary | ICD-10-CM | POA: Diagnosis not present

## 2014-04-10 DIAGNOSIS — M542 Cervicalgia: Secondary | ICD-10-CM

## 2014-04-10 DIAGNOSIS — R531 Weakness: Secondary | ICD-10-CM

## 2014-04-10 NOTE — Patient Instructions (Signed)
Pt given green theraband and worksheet to add to HEP

## 2014-04-10 NOTE — Therapy (Signed)
[facility] [address] [place], [place], [address] Phone: [phone]   Fax:  [phone]  Physical Therapy Treatment  Patient Details  Name: Michelle [facility] MRN: [id number] Date of Birth: 1975/11/12  Encounter Date: 04/10/2014      PT End of Session - 04/10/14 1349    Visit Number 4   Number of Visits 8   Date for PT Re-Evaluation 04/27/14   Authorization Type UMR   PT Start Time 1310   PT Stop Time 1345   PT Time Calculation (min) 35 min   Activity Tolerance Patient tolerated treatment well   Behavior During Therapy [facility] for tasks assessed/performed      Past Medical History  Diagnosis Date  . Hypertension     Aldomet 250 mg Q day  . Infection     Yeast inf; no frequent  . IBS (irritable bowel syndrome)     no issues currently  . Anemia   . GERD (gastroesophageal reflux disease)     pregnancy    Past Surgical History  Procedure Laterality Date  . Breast reduction surgery  1997  . Cesarean section  2011  . Bilateral salpingectomy  05/31/2012    Procedure: BILATERAL SALPINGECTOMY;  Surgeon: Hal MoralesVanessa P Haygood, MD;  Location: [facility];  Service: Obstetrics;  Laterality: Bilateral;  . Cesarean section  05/31/2012    Procedure: CESAREAN SECTION;  Surgeon: Hal MoralesVanessa P Haygood, MD;  Location: [facility];  Service: Obstetrics;  Laterality: N/A;    LMP 03/13/2014 (Exact Date)  Visit Diagnosis:  Cervical strain, acute, initial encounter  Cervical pain  Decreased strength      Subjective Assessment - 04/10/14 1313    Symptoms Pt reports she is feeling like she is making progress  Stated she is sore following last session, current pain scale 5/10 Lt  LE and middle of back.   Currently in Pain? Yes   Pain Score 5    Pain Location Neck   Pain Orientation Left   Pain Descriptors / Indicators Aching   Pain Score 5   Pain Location Back   Pain Radiating Towards mid back          [facility] PT Assessment - 04/10/14 0001    Assessment    Medical Diagnosis cervical strain   Onset Date 03/24/15   Next MD Visit unschedled   Prior Therapy none          [facility] Adult PT Treatment/Exercise - 04/10/14 1359    Exercises   Exercises Neck   Neck Exercises: Stretches   Upper Trapezius Stretch 3 reps;30 seconds   Neck Exercises: Theraband   Scapula Retraction 15 reps;Green   Shoulder Extension 15 reps;Green   Rows 15 reps;Green   Neck Exercises: Standing   Neck Retraction 10 reps;Limitations   Neck Retraction Limitations with retraction   Neck Exercises: Seated   Cervical Rotation Both;10 reps   Cervical Rotation Limitations 3D Cervical excursion   Lateral Flexion Both;10 reps   Lateral Flexion Limitations 3D cervical excursion   X to V 10 reps   Neck Exercises: Prone   W Back 10 reps   Shoulder Extension 10 reps   Rows 10 reps   Manual Therapy   Manual Therapy Massage   Massage cervical and upper  traps, Manual position release for Lt UT          PT Education - 04/10/14 1349    Education provided Yes   Education Details Importance of good posture for neck  pain control   Person(s) Educated Patient   Methods Explanation;Demonstration;Handout   Comprehension Verbalized understanding;Returned demonstration          PT Short Term Goals - 04/10/14 1359    PT SHORT TERM GOAL #1   Title Pt to be I in HEP   Status On-going   PT SHORT TERM GOAL #2   Title Pt ROM to be wnl  for better cervical motion    Status On-going   PT SHORT TERM GOAL #3   Title Pt pain to be no grater than a 3/5 80% of the time   Status On-going   PT SHORT TERM GOAL #4   Title strength to be improved to a 4/5 to allow pain level to go down    Status On-going          PT Long Term Goals - 04/10/14 1359    PT LONG TERM GOAL #1   Title Pt to be I in advanced HEP   PT LONG TERM GOAL #2   Title Pain level to be no greater than a 1/0   PT LONG TERM GOAL #3   Title Pt to be able to sit for 2 hours without pain   PT LONG TERM GOAL #4    Title Pt to be able to walk for an hour without increased pain    PT LONG TERM GOAL #5   Title strength to be 5/5 to allow pt to return to previous functional level           Plan - 04/10/14 1349    Clinical Impression Statement Pt able to demonstrate appropriate technique with postural strengthening theraband activities, pt given green tband and handout to add to HEP.  Added standing postural strengthening exercise with min cueing required for proper form and technique.  Manual techniques complete at end of session to reduce spasm in Lt upper trap.  Manual position release complete in supine position with reports of decreased pain following.  Pt stated pain reduced to 3/10 Lt upper traps.   PT Next Visit Plan Continue to progress toward goals for posutral strengthening and education.  Review theraband exercises x 1 more session to assure confidence and proper techniuqe.then HEP, continue with standing cervical retraction and prone exercises, add chin tuck with head raise next session.  Manual and modalities as needed.        Problem List Patient Active Problem List   Diagnosis Date Noted  . Adjustment disorder with mixed anxiety and depressed mood 09/12/2012  . Hypokalemia 06/19/2012  . Status post repeat low transverse cesarean section 06/02/2012  . Hordeolum externum of left upper eyelid 02/29/2012  . Nasal congestion with rhinorrhea 02/29/2012  . Essential hypertension, benign 08/19/2011  . Obesity 08/19/2011  . Anxiety 08/19/2011  . Seasonal allergies 08/19/2011   Becky Saxasey Cockerham, LPTA [phone]  Michelle Wilson 04/10/2014, 2:11 PM

## 2014-04-15 ENCOUNTER — Ambulatory Visit (HOSPITAL_COMMUNITY)
Admission: RE | Admit: 2014-04-15 | Discharge: 2014-04-15 | Disposition: A | Discharge status: Home / Self Care | Payer: No Typology Code available for payment source | Source: Ambulatory Visit | Attending: Family Medicine | Admitting: Family Medicine

## 2014-04-15 DIAGNOSIS — M542 Cervicalgia: Secondary | ICD-10-CM

## 2014-04-15 DIAGNOSIS — S161XXA Strain of muscle, fascia and tendon at neck level, initial encounter: Secondary | ICD-10-CM | POA: Diagnosis not present

## 2014-04-15 NOTE — Addendum Note (Signed)
Encounter addended by: Bella Kennedyynthia J Russell, PT on: 04/15/2014  4:00 PM<BR>     Documentation filed: Clinical Notes

## 2014-04-15 NOTE — Therapy (Signed)
[facility] [address] [place], [place], [address] Phone: [phone]   Fax:  [phone]  Physical Therapy Evaluation  Patient Details  Name: Michelle Wilson MRN: [id number] Date of Birth: Oct 29, 1975  Encounter Date: 04/15/2014      PT End of Session - 04/15/14 1420    Visit Number 5   Number of Visits 5   Authorization Type UMR   PT Start Time 1400   PT Stop Time 1430   PT Time Calculation (min) 30 min   Activity Tolerance Patient tolerated treatment well      Past Medical History  Diagnosis Date  . Hypertension     Aldomet 250 mg Q day  . Infection     Yeast inf; no frequent  . IBS (irritable bowel syndrome)     no issues currently  . Anemia   . GERD (gastroesophageal reflux disease)     pregnancy    Past Surgical History  Procedure Laterality Date  . Breast reduction surgery  1997  . Cesarean section  2011  . Bilateral salpingectomy  05/31/2012    Procedure: BILATERAL SALPINGECTOMY;  Surgeon: Hal MoralesVanessa P Haygood, MD;  Location: [facility];  Service: Obstetrics;  Laterality: Bilateral;  . Cesarean section  05/31/2012    Procedure: CESAREAN SECTION;  Surgeon: Hal MoralesVanessa P Haygood, MD;  Location: [facility];  Service: Obstetrics;  Laterality: N/A;    LMP 03/13/2014 (Exact Date)  Visit Diagnosis:  Cervical strain, acute, initial encounter  Cervical pain      Subjective Assessment - 04/15/14 1437    Symptoms Pt states she is feeling very good feels as if she is ready for discharge.   How long can you sit comfortably? sitting increases pain after about an hour and a half was an hour. .    How long can you stand comfortably? not a problem    How long can you walk comfortably? no problem walking    Patient Stated Goals less pain    Currently in Pain? 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    Leisure walk   Cognition   Overall Cognitive Status Within Functional Limits for tasks assessed   Observation/Other Assessments   Focus on Therapeutic Outcomes (FOTO)  was 61 now 98   Posture/Postural Control   Posture/Postural Control No significant limitations   AROM   Cervical Flexion --  wfl reps increase pain   Cervical Extension wfl reps no change   Cervical - Right Side Bend wfl was decreased 20% reps no change   Cervical - Left Side Bend wfl reps increase pain    Cervical - Right Rotation wfl with pull    Cervical - Left Rotation wfl no change    Strength   Cervical Extension 5/5   Cervical - Right Side Bend 5/5  was 3-/5   Cervical - Left Side Bend 4/5  was 3-/5   Cervical - Right Rotation 5/5  was 3-/5   Special Tests    Special Tests Cervical   Cervical Tests --  no mm spasm noted in cervical or thoracic area  [facility] Adult PT Treatment/Exercise - 04/15/14 0001    Neck Exercises: Seated   Cervical Isometrics Left lateral flexion;5 reps   Neck Exercises: Prone   Axial Exentsion 10 reps   W Back 10 reps   Shoulder Extension 10 reps   Rows 10 reps   Other Prone Exercise double arm raise x 10    Manual Therapy   Manual Therapy Massage   Massage no mm spasm palpatable                   PT Short Term Goals - 04/15/14 1421    PT SHORT TERM GOAL #1   Title Pt to be I in HEP   Time 2   Period Weeks   Status Achieved   PT SHORT TERM GOAL #2   Title Pt ROM to be wnl  for better cervical motion    Time 2   Period Weeks   Status Achieved   PT SHORT TERM GOAL #3   Title Pt pain to be no grater than a 3/5 80% of the time   Period Weeks   Status Achieved   PT SHORT TERM GOAL #4   Title strength to be improved to a 4/5 to allow pain level to go down    Period Weeks    Status Achieved           PT Long Term Goals - 04/15/14 1422    PT LONG TERM GOAL #1   Title Pt to be I in advanced HEP   Time 4   Period Weeks   Status Achieved   PT LONG TERM GOAL #2   Title Pain level to be no greater than a 1/0   Time 4   Period Weeks   Status Achieved   PT LONG TERM GOAL #3   Title Pt to be able to sit for 2 hours without pain   Time 4   Period Weeks   Status Achieved   PT LONG TERM GOAL #4   Title Pt to be able to walk for an hour without increased pain    Period Weeks   Status Achieved   PT LONG TERM GOAL #5   Title strength to be 5/5 to allow pt to return to previous functional level    Time 4   Period Weeks               Plan - 04/15/14 1437    Clinical Impression Statement Pt demonstrates normal ROM with no pain, good strength and good technique on exercises.  Pt is no longer in need of skilled therapy will discharge.    PT Next Visit Plan discharge         Problem List Patient Active Problem List   Diagnosis Date Noted  . Adjustment disorder with mixed anxiety and depressed mood 09/12/2012  . Hypokalemia 06/19/2012  . Status post repeat low transverse cesarean section 06/02/2012  . Hordeolum externum of left upper eyelid 02/29/2012  . Nasal congestion with rhinorrhea 02/29/2012  . Essential hypertension, benign 08/19/2011  . Obesity 08/19/2011  . Anxiety 08/19/2011  . Seasonal allergies 08/19/2011    RUSSELL,CINDY PT 04/15/2014, 2:42 PM  [facility] [address] [place], [place], [address] Phone: [phone]   Fax:  [phone]

## 2014-04-17 ENCOUNTER — Ambulatory Visit (HOSPITAL_COMMUNITY): Payer: No Typology Code available for payment source

## 2014-04-23 ENCOUNTER — Encounter (HOSPITAL_COMMUNITY): Payer: 59 | Admitting: Physical Therapy

## 2014-04-25 ENCOUNTER — Encounter (HOSPITAL_COMMUNITY): Payer: 59 | Admitting: Physical Therapy

## 2014-05-16 LAB — HM PAP SMEAR: HM PAP: NEGATIVE

## 2014-07-08 ENCOUNTER — Other Ambulatory Visit: Payer: Self-pay | Admitting: Obstetrics and Gynecology

## 2014-07-26 ENCOUNTER — Ambulatory Visit: Payer: 59 | Admitting: Family Medicine

## 2014-08-22 ENCOUNTER — Ambulatory Visit: Payer: 59 | Admitting: Family Medicine

## 2014-08-23 ENCOUNTER — Ambulatory Visit: Payer: 59 | Admitting: Family Medicine

## 2014-08-28 ENCOUNTER — Encounter: Payer: Self-pay | Admitting: Family Medicine

## 2014-08-28 ENCOUNTER — Ambulatory Visit (INDEPENDENT_AMBULATORY_CARE_PROVIDER_SITE_OTHER): Payer: 59 | Admitting: Family Medicine

## 2014-08-28 VITALS — BP 132/74 | HR 82 | Temp 98.2°F | Resp 16 | Ht 66.0 in | Wt 258.0 lb

## 2014-08-28 DIAGNOSIS — I10 Essential (primary) hypertension: Secondary | ICD-10-CM

## 2014-08-28 DIAGNOSIS — F419 Anxiety disorder, unspecified: Secondary | ICD-10-CM

## 2014-08-28 DIAGNOSIS — E669 Obesity, unspecified: Secondary | ICD-10-CM | POA: Diagnosis not present

## 2014-08-28 DIAGNOSIS — Z1321 Encounter for screening for nutritional disorder: Secondary | ICD-10-CM | POA: Diagnosis not present

## 2014-08-28 LAB — COMPREHENSIVE METABOLIC PANEL
ALBUMIN: 3.7 g/dL (ref 3.5–5.2)
ALK PHOS: 89 U/L (ref 39–117)
ALT: 18 U/L (ref 0–35)
AST: 17 U/L (ref 0–37)
BILIRUBIN TOTAL: 0.6 mg/dL (ref 0.2–1.2)
BUN: 13 mg/dL (ref 6–23)
CO2: 29 meq/L (ref 19–32)
Calcium: 9.1 mg/dL (ref 8.4–10.5)
Chloride: 103 mEq/L (ref 96–112)
Creat: 0.78 mg/dL (ref 0.50–1.10)
GLUCOSE: 86 mg/dL (ref 70–99)
Potassium: 3.6 mEq/L (ref 3.5–5.3)
Sodium: 140 mEq/L (ref 135–145)
Total Protein: 7.1 g/dL (ref 6.0–8.3)

## 2014-08-28 LAB — LIPID PANEL
Cholesterol: 170 mg/dL (ref 0–200)
HDL: 50 mg/dL (ref >=46)
LDL CALC: 108 mg/dL — AB (ref 0–99)
TRIGLYCERIDES: 59 mg/dL (ref <150)
Total CHOL/HDL Ratio: 3.4 Ratio
VLDL: 12 mg/dL (ref 0–40)

## 2014-08-28 LAB — TSH: TSH: 1.069 u[IU]/mL (ref 0.350–4.500)

## 2014-08-28 LAB — CBC WITH DIFFERENTIAL/PLATELET
BASOS ABS: 0 10*3/uL (ref 0.0–0.1)
BASOS PCT: 0 % (ref 0–1)
EOS ABS: 0.1 10*3/uL (ref 0.0–0.7)
Eosinophils Relative: 2 % (ref 0–5)
HCT: 35 % — ABNORMAL LOW (ref 36.0–46.0)
Hemoglobin: 11.4 g/dL — ABNORMAL LOW (ref 12.0–15.0)
Lymphocytes Relative: 28 % (ref 12–46)
Lymphs Abs: 1.8 10*3/uL (ref 0.7–4.0)
MCH: 26.6 pg (ref 26.0–34.0)
MCHC: 32.6 g/dL (ref 30.0–36.0)
MCV: 81.8 fL (ref 78.0–100.0)
MPV: 8.8 fL (ref 8.6–12.4)
Monocytes Absolute: 0.4 10*3/uL (ref 0.1–1.0)
Monocytes Relative: 6 % (ref 3–12)
NEUTROS ABS: 4.2 10*3/uL (ref 1.7–7.7)
NEUTROS PCT: 64 % (ref 43–77)
PLATELETS: 313 10*3/uL (ref 150–400)
RBC: 4.28 MIL/uL (ref 3.87–5.11)
RDW: 14.8 % (ref 11.5–15.5)
WBC: 6.5 10*3/uL (ref 4.0–10.5)

## 2014-08-28 MED ORDER — ALPRAZOLAM 0.5 MG PO TABS: 0.5000 mg | ORAL_TABLET | Freq: Three times a day (TID) | ORAL | Status: DC | PRN

## 2014-08-28 MED ORDER — LOSARTAN POTASSIUM-HCTZ 100-25 MG PO TABS: ORAL_TABLET | ORAL | Status: DC

## 2014-08-28 NOTE — Progress Notes (Signed)
Patient ID: Michelle Wilson, female   DOB: Jun 23, 1975, 39 y.o.   MRN: [id number]   Subjective:    Patient ID: Michelle Booksamille M Wilson, female    DOB: Jun 23, 1975, 39 y.o.   MRN: [id number]  Patient presents for 4 month F/U  patient year to follow-up medications and for labs. She states that she is doing well in general. She has been under some stress with trying to sell her home which is been on the market for 3 years. She thought that she had a buyer last week. Everything felt to read and in her nerves are really bad she did not have her alprazolam. She also experienced extremely heavy bleeding with her menstrual cycles over the past few months she was seen by her GYN she had endometrial biopsy done which was benign but she is due for repeat hemoglobin checked because of history of anemia. We also discussed her weight she's been trying to Eat better but has not seen any significant change she is down 4 pounds since her last visit 8 months ago. Her brother is coming into town and is planning to train her in a bootcamp style    Review Of Systems:  GEN- denies fatigue, fever, weight loss,weakness, recent illness HEENT- denies eye drainage, change in vision, nasal discharge, CVS- denies chest pain, palpitations RESP- denies SOB, cough, wheeze ABD- denies N/V, change in stools, abd pain GU- denies dysuria, hematuria, dribbling, incontinence MSK- denies joint pain, muscle aches, injury Neuro- denies headache, dizziness, syncope, seizure activity       Objective:    BP 132/74 mmHg  Pulse 82  Temp(Src) 98.2 F (36.8 C) (Oral)  Resp 16  Ht 5\' 6"  (1.676 m)  Wt 258 lb (117.028 kg)  BMI 41.66 kg/m2  LMP 08/23/2014 (Approximate) GEN- NAD, alert and oriented x3 HEENT- PERRL, EOMI, non injected sclera, pink conjunctiva, MMM, oropharynx clear Neck- Supple, no thyromegaly CVS- RRR, no murmur RESP-CTAB Psych- normal affect and mood EXT- No edema Pulses- Radial, DP- 2+        Assessment &  Plan:      Problem List Items Addressed This Visit    Obesity - Primary   Relevant Orders   Lipid panel   Essential hypertension, benign   Relevant Orders   Comprehensive metabolic panel   CBC with Differential/Platelet   Lipid panel   TSH    Other Visit Diagnoses    Encounter for vitamin deficiency screening        Relevant Orders    Vitamin D, 25-hydroxy       Note: This dictation was prepared with Dragon dictation along with smaller phrase technology. Any transcriptional errors that result from this process are unintentional.

## 2014-08-28 NOTE — Assessment & Plan Note (Signed)
Continue xanax, will increase to 60 tablets

## 2014-08-28 NOTE — Assessment & Plan Note (Signed)
Well controlled, no change to meds 

## 2014-08-28 NOTE — Patient Instructions (Signed)
We will call with lab results  Continue current medications Nutrition referral  1800 calories -My fitness pal APP F/U 3 months

## 2014-08-28 NOTE — Addendum Note (Signed)
Addended by: Milinda AntisURHAM, Andrika Peraza F on: 08/28/2014 12:58 PM   Modules accepted: Orders

## 2014-08-28 NOTE — Assessment & Plan Note (Signed)
Continue with dietary changes, referral weight loss Start exercise program Myfitness pal app

## 2014-08-29 LAB — VITAMIN D 25 HYDROXY (VIT D DEFICIENCY, FRACTURES): VIT D 25 HYDROXY: 34 ng/mL (ref 30–100)

## 2014-08-30 ENCOUNTER — Encounter: Payer: Self-pay | Admitting: Family Medicine

## 2014-11-22 ENCOUNTER — Ambulatory Visit: Payer: 59 | Admitting: Nutrition

## 2014-11-29 ENCOUNTER — Ambulatory Visit: Payer: 59 | Admitting: Family Medicine

## 2014-12-19 ENCOUNTER — Telehealth: Payer: 59 | Admitting: Physician Assistant

## 2014-12-19 DIAGNOSIS — J019 Acute sinusitis, unspecified: Secondary | ICD-10-CM

## 2014-12-19 DIAGNOSIS — B9689 Other specified bacterial agents as the cause of diseases classified elsewhere: Secondary | ICD-10-CM

## 2014-12-19 MED ORDER — AMOXICILLIN-POT CLAVULANATE 875-125 MG PO TABS: 1.0000 | ORAL_TABLET | Freq: Two times a day (BID) | ORAL | Status: DC

## 2014-12-19 NOTE — Progress Notes (Signed)

## 2015-01-03 ENCOUNTER — Encounter: Payer: 59 | Attending: Family Medicine | Admitting: Nutrition

## 2015-01-03 ENCOUNTER — Encounter: Payer: Self-pay | Admitting: Family Medicine

## 2015-01-03 ENCOUNTER — Encounter: Payer: Self-pay | Admitting: Nutrition

## 2015-01-03 ENCOUNTER — Ambulatory Visit (INDEPENDENT_AMBULATORY_CARE_PROVIDER_SITE_OTHER): Payer: 59 | Admitting: Family Medicine

## 2015-01-03 VITALS — BP 132/76 | HR 78 | Temp 98.2°F | Resp 16 | Ht 66.0 in | Wt 250.0 lb

## 2015-01-03 DIAGNOSIS — F419 Anxiety disorder, unspecified: Secondary | ICD-10-CM

## 2015-01-03 DIAGNOSIS — L03011 Cellulitis of right finger: Secondary | ICD-10-CM

## 2015-01-03 DIAGNOSIS — Z23 Encounter for immunization: Secondary | ICD-10-CM | POA: Diagnosis not present

## 2015-01-03 DIAGNOSIS — Z6841 Body Mass Index (BMI) 40.0 and over, adult: Secondary | ICD-10-CM | POA: Diagnosis not present

## 2015-01-03 DIAGNOSIS — E669 Obesity, unspecified: Secondary | ICD-10-CM

## 2015-01-03 DIAGNOSIS — Z713 Dietary counseling and surveillance: Secondary | ICD-10-CM | POA: Diagnosis not present

## 2015-01-03 DIAGNOSIS — I10 Essential (primary) hypertension: Secondary | ICD-10-CM | POA: Diagnosis not present

## 2015-01-03 MED ORDER — CEPHALEXIN 500 MG PO CAPS: 500.0000 mg | ORAL_CAPSULE | Freq: Two times a day (BID) | ORAL | Status: DC

## 2015-01-03 MED ORDER — LOSARTAN POTASSIUM-HCTZ 100-25 MG PO TABS: ORAL_TABLET | ORAL | Status: DC

## 2015-01-03 MED ORDER — ALPRAZOLAM 0.5 MG PO TABS: 0.5000 mg | ORAL_TABLET | Freq: Three times a day (TID) | ORAL | Status: DC | PRN

## 2015-01-03 NOTE — Progress Notes (Signed)
  Medical Nutrition Therapy:  Appt start time: 0900 end time:  1000.  Assessment:  Primary concerns today: Obesity. LIves with her husband and 2 kids. BMI 41. She does the majority of cooking and shopping. Most foods are baked. Eats out 2-3 times per week. Goal weight:  Lose 20 lbs in three months. Physical activity:  Will be working out walking 4 days per week 30-40 minutes for 20 miles. Admits to eating fast foods. Works long hours and going to school also. Doesn't have time to cook a lot. Had been skipping breakfast but now eating three meals per day. Keeps food journal in My Fitness Pal. Willing to make behavior and lifestyle changes to improve weight. LDL elevated at 108 mg/dl. No A1C but should be screened with risk factors of Obesity and HTN and elevated LDL. Diet is inadequate in all food groups to meet her nutritional needs.   Preferred Learning Style:   No preference indicated   Learning Readiness:   Ready  Change in progress   MEDICATIONS: See list   DIETARY INTAKE:  24-hr recall:  B ( AM):Yogurt or oatmeal, water Snk ( AM): none  L ( PM): Ham with mustard sandwich and fruit and water Snk ( PM): Veggies sticks D ( PM): Baked potato, and toss salad- [place] dressing(-letuce, lettuce, bacon bits, tomatoes) water Snk ( PM): none Beverages: water  Usual physical activity: walking some   Estimated energy needs: 1500 calories 170 g carbohydrates 112 g protein 42 g fat  Progress Towards Goal(s):  In progress.   Nutritional Diagnosis:  NB-1.1 Food and nutrition-related knowledge deficit As related to Obesity.  As evidenced by BMI 41.    Intervention:  Nutrition counseling for weight loss, emotional eating, CHO counting, MY Plate, portion sizes, meal planning, low fat low sodium high fiber diet, emotional eating,, keeping a food journal, meal timing, weight loss tips and benefits of exercise..  Goals:  1. Follow My Plate 2. Eat three meals per day-2-3 carb choices per  meal. 3. Increase fresh fruits and vegetables and whole grains. 4. Exercise 150 minutes per week. 5. Don't skip meals 6. Lose 1-2 lbs per week. 7. Keep food journal and use My Fitness pal for foods. 8. Get A1C tested by your doctor to rule out Prediabetes due to risk factors.  Teaching Method Utilized:  Visual Auditory Hands on  Handouts given during visit include:  Meal Plan Card  The Plate Method  Emotional Eating  Weight loss tips.  Barriers to learning/adherence to lifestyle change: None  Demonstrated degree of understanding via:  Teach Back   Monitoring/Evaluation:  Dietary intake, exercise, meal planning,food journal and body weight 1 month. Recommend to check an A1C due to BMI of 41, HTN and elevated LDL.

## 2015-01-03 NOTE — Addendum Note (Signed)
Addended by: Phillips Odor on: 01/03/2015 04:33 PM   Modules accepted: Orders

## 2015-01-03 NOTE — Assessment & Plan Note (Signed)
Pressure looks good on  HCT z-losartan will continue at current dose continue to work on weight loss she is now working with nutritionist

## 2015-01-03 NOTE — Patient Instructions (Addendum)
Continue keflex Continue current medications Flu shot  F/U 6 months

## 2015-01-03 NOTE — Patient Instructions (Signed)
Goals:  1. Follow My Plate 2. Eat three meals per day-2-3 carb choices per meal. 3. Increase fresh fruits and vegetables and whole grains. 4. Exercise 150 minutes per week. 5. Don't skip meals 6. Lose 1-2 lbs per week. 7. Keep food journal and use My Fitness pal for foods. 8. Get A1C tested by your doctor to rule out Prediabetes due to risk factors.

## 2015-01-03 NOTE — Assessment & Plan Note (Signed)
Continue Xanax at current dose.

## 2015-01-03 NOTE — Progress Notes (Signed)
Patient ID: Michelle Wilson, female   DOB: [birth year], 39 y.o.   MRN: [id number]   Subjective:    Patient ID: Michelle Wilson, female    DOB: 01/28/1976, 39 y.o.   MRN: [id number]  Patient presents for 3 month F/U; Immunizations; and R Middle Finger Issue  patient to follow chronic medical problems. In general she states she is feeling well. She continues to use her Xanax as needed and her sleeping medication.   She was out in her garden about a month and a half ago and subsequently had some redness and swelling around her middle finger on her right hand it went down but then recently she was picking at it and noticed some pus come out of it and it is still swollen. She has not had any fever no significant pain.   Due for flu shot    Obesity she is now following with nutrition as her weight is down 8 pounds since her last visit  4 months ago    Review Of Systems:  GEN- denies fatigue, fever, weight loss,weakness, recent illness HEENT- denies eye drainage, change in vision, nasal discharge, CVS- denies chest pain, palpitations RESP- denies SOB, cough, wheeze ABD- denies N/V, change in stools, abd pain GU- denies dysuria, hematuria, dribbling, incontinence MSK- denies joint pain, muscle aches, injury Neuro- denies headache, dizziness, syncope, seizure activity       Objective:    BP 132/76 mmHg  Pulse 78  Temp(Src) 98.2 F (36.8 C) (Oral)  Resp 16  Ht  (1.676 m)  Wt 250 lb (113.399 kg)  BMI 40.37 kg/m2  LMP 12/28/2014 (Approximate) GEN- NAD, alert and oriented x3 HEENT- PERRL, EOMI, non injected sclera, pink conjunctiva, MMM, oropharynx clear CVS- RRR, no murmur RESP-CTAB Psych- normal affect and mood Skin- Right hand middle finger, swelling along medial aspect of nail, mild indutation beneath scab but no pus expressed, no erythema EXT- No edema Pulses- Radial  2+        Assessment & Plan:      Problem List Items Addressed This Visit    Obesity   Essential hypertension, benign     Pressure looks good on  HCT z-losartan will continue at current dose continue to work on weight loss she is now working with nutritionist      Relevant Medications   losartan-hydrochlorothiazide (HYZAAR) 100-25 MG per tablet   Anxiety     Continue Xanax at current dose      Relevant Medications   ALPRAZolam (XANAX) 0.5 MG tablet    Other Visit Diagnoses    Paronychia of finger of right hand    -  Primary    Keflex, no area to open up, will use epson salt soaks, advised against picking or biting nail    Relevant Medications    cephALEXin (KEFLEX) 500 MG capsule       Note: This dictation was prepared with Dragon dictation along with smaller phrase technology. Any transcriptional errors that result from this process are unintentional.

## 2015-02-21 ENCOUNTER — Ambulatory Visit: Payer: 59 | Admitting: Nutrition

## 2015-03-15 ENCOUNTER — Telehealth: Payer: 59 | Admitting: Nurse Practitioner

## 2015-03-15 DIAGNOSIS — J0101 Acute recurrent maxillary sinusitis: Secondary | ICD-10-CM | POA: Diagnosis not present

## 2015-03-15 MED ORDER — AZITHROMYCIN 250 MG PO TABS: ORAL_TABLET | ORAL | Status: DC

## 2015-03-15 NOTE — Progress Notes (Signed)

## 2015-03-28 ENCOUNTER — Encounter: Payer: 59 | Attending: Family Medicine | Admitting: Nutrition

## 2015-03-28 ENCOUNTER — Encounter: Payer: Self-pay | Admitting: Nutrition

## 2015-03-28 DIAGNOSIS — Z713 Dietary counseling and surveillance: Secondary | ICD-10-CM | POA: Diagnosis not present

## 2015-03-28 DIAGNOSIS — Z6841 Body Mass Index (BMI) 40.0 and over, adult: Secondary | ICD-10-CM | POA: Diagnosis not present

## 2015-03-28 DIAGNOSIS — I10 Essential (primary) hypertension: Secondary | ICD-10-CM

## 2015-03-28 NOTE — Progress Notes (Signed)
  Medical Nutrition Therapy:  Appt start time: 0800 end time:  0830.  Assessment:  Primary concerns today: Obesity   Hasn't been skipping meals as much.. Hasnt been able to  Exercise though.. Admits to eating snacks and grazing at times. Gained 4 lbs since last visit.  Diet is inadequate in fresh fruits, low carb vegetables and high fiber foods. She started using her My Fitness Pal but got busy and forgot to use it. Willing to consider getting a treadmill and start using it at home or using the hospital gym a few times per week. Needs greater commitment for weight loss results. She has mentioned bariatric weight loss surgery. Stressed need for lifestyle  And behavior changes regardless to make weight loss sustainable.   Preferred Learning Style:   No preference indicated   Learning Readiness:   Ready  Change in progress   MEDICATIONS: See list   DIETARY INTAKE:  24-hr recall:  B ( AM):Oatmeal, water Snk ( AM): none  L ( PM): Baked chicken, potatoes, Snk ( PM): popcorn, D ( PM): Steamed cabbage, salmon cakes, potaotes, water Snk ( PM): none Beverages: water  Usual physical activity: walking some   Estimated energy needs: 1500 calories 170 g carbohydrates 112 g protein 42 g fat  Progress Towards Goal(s):  In progress.   Nutritional Diagnosis:  NB-1.1 Food and nutrition-related knowledge deficit As related to Obesity.  As evidenced by BMI 41.    Intervention:  Nutrition counseling for weight loss, emotional eating, CHO counting, MY Plate, portion sizes, meal planning, low fat low sodium high fiber diet, emotional eating,, keeping a food journal, meal timing, weight loss tips and benefits of exercise. Pro/Con of weight loss surgery, benefits of exercise and need for food journal. Goals:  1. Follow My Plate 2. Exercise 30 minutes three times. 3. Cut out snacks. 4. Lose 1 lb per week.   Teaching Method Utilized:  Visual Auditory Hands on  Handouts given during visit  include:  Meal Plan Card  The Plate Method  Emotional Eating  Weight loss tips.  Barriers to learning/adherence to lifestyle change: None  Demonstrated degree of understanding via:  Teach Back   Monitoring/Evaluation:  Dietary intake, exercise, meal planning,food journal and body weight 1 month. Recommend to check an A1C due to BMI of 41, HTN and elevated LDL.

## 2015-03-28 NOTE — Patient Instructions (Signed)
Goals:  1. Follow My Plate 2. Exercise 30 minutes three times. 3. Cut out snacks. 4. Lose 1 lb per week.

## 2015-05-23 ENCOUNTER — Ambulatory Visit: Payer: 59 | Admitting: Nutrition

## 2015-05-29 MED FILL — ALPRAZolam 0.5 MG TABS: 0.5 | 20 days supply | Qty: 60 | Fill #2

## 2015-06-26 MED FILL — LOSARTAN-HCTZ 100-25 MG TAB: 100-25 | 90 days supply | Qty: 90 | Fill #2

## 2015-06-26 MED FILL — ALPRAZolam 0.5 MG TABS: 0.5 | 20 days supply | Qty: 60 | Fill #3

## 2015-06-27 ENCOUNTER — Encounter: Payer: Self-pay | Admitting: Nutrition

## 2015-06-27 ENCOUNTER — Encounter: Payer: 59 | Attending: Family Medicine | Admitting: Nutrition

## 2015-06-27 DIAGNOSIS — I10 Essential (primary) hypertension: Secondary | ICD-10-CM | POA: Insufficient documentation

## 2015-06-27 DIAGNOSIS — Z713 Dietary counseling and surveillance: Secondary | ICD-10-CM | POA: Diagnosis not present

## 2015-06-27 DIAGNOSIS — Z6841 Body Mass Index (BMI) 40.0 and over, adult: Secondary | ICD-10-CM | POA: Diagnosis not present

## 2015-06-27 NOTE — Progress Notes (Signed)
  Medical Nutrition Therapy:  Appt start time: 0800 end time:  0830.  Assessment:  Primary concerns today: Obesity   Lost 1 lb. Has been driinking slimfast for breakfast and dinner but got off track. Had lost 4 lbs previous and gained some back. Struggling with meal planning ahead and finding time to exercise like she wants to and being disciplined enough not to snack. She admits to being a grazer. Has kept a food journal on and off. Admits to feeling better with drinking the slimfast and tracking her food. Wants to lose weight but needs to be more  consistent with efforts. Is going to school and working full time while raising a family with her husband. Has thought about bariatric surgery but would rather do it on her own rather than go thru surgery. Needs more consistency with fresh fruits, vegetables, whole grains and exercise. Needs to cut out grazing and higher fat meals on weekends or at home.   Preferred Learning Style:   No preference indicated   Learning Readiness:   Ready  Change in progress   MEDICATIONS: See list   DIETARY INTAKE:  24-hr recall:  B ( AM):Slimfast OR yogurt and banana Snk ( AM): none  L ( PM): Toss salad taco, water Snk ( PM): D ( PM):  Meat and some veggies. Snk ( PM): none Beverages: water  Usual physical activity: walking some   Estimated energy needs: 1500 calories 170 g carbohydrates 112 g protein 42 g fat  Progress Towards Goal(s):  In progress.   Nutritional Diagnosis:  NB-1.1 Food and nutrition-related knowledge deficit As related to Obesity.  As evidenced by BMI 41.    Intervention:  Nutrition counseling for weight loss, emotional eating, CHO counting, MY Plate, portion sizes, meal planning, low fat low sodium high fiber diet, emotional eating,, keeping a food journal, meal timing, weight loss tips and benefits of exercise. Pro/Con of weight loss surgery, benefits of exercise and need for food journal.  Goals:  1. Follow My Plate 2.  Exercise 30 minutes three times a week.. 3. Only choose vegetable and protein for snack and no slimfast or other snacks between meals. 4. Continue with Slimfast for breakfast and lunch if desired.. 4. Lose 12 lbs in three months.  Goal weight: 241 lbs.   Teaching Method Utilized:  Visual Auditory Hands on  Handouts given during visit include:  Meal Plan Card  The Plate Method  Emotional Eating  Weight loss tips.  Barriers to learning/adherence to lifestyle change: None  Demonstrated degree of understanding via:  Teach Back   Monitoring/Evaluation:  Dietary intake, exercise, meal planning,food journal and body weight 3 month. Recommend to check an A1C due to BMI of 41, HTN and elevated LDL.

## 2015-06-27 NOTE — Patient Instructions (Addendum)
  Goals:  1. Follow My Plate 2. Exercise 30 minutes three times a week.. 3. Only choose vegetable and protein for snack and no slimfast or other snacks between meals. 4. Continue with Slimfast for breakfast and lunch if desired.. 4. Lose 12 lbs in three months.  Goal weight: 241 lbs.

## 2015-07-01 ENCOUNTER — Telehealth: Payer: 59 | Admitting: Nurse Practitioner

## 2015-07-01 DIAGNOSIS — J0101 Acute recurrent maxillary sinusitis: Secondary | ICD-10-CM

## 2015-07-01 MED ORDER — AZITHROMYCIN 250 MG PO TABS: ORAL_TABLET | ORAL | Status: DC

## 2015-07-01 NOTE — Progress Notes (Signed)

## 2015-07-04 ENCOUNTER — Ambulatory Visit (INDEPENDENT_AMBULATORY_CARE_PROVIDER_SITE_OTHER): Payer: 59 | Admitting: Family Medicine

## 2015-07-04 ENCOUNTER — Encounter: Payer: Self-pay | Admitting: Family Medicine

## 2015-07-04 VITALS — BP 126/86 | HR 82 | Temp 98.4°F | Resp 18 | Wt 256.0 lb

## 2015-07-04 DIAGNOSIS — E669 Obesity, unspecified: Secondary | ICD-10-CM | POA: Diagnosis not present

## 2015-07-04 DIAGNOSIS — F419 Anxiety disorder, unspecified: Secondary | ICD-10-CM

## 2015-07-04 DIAGNOSIS — J0100 Acute maxillary sinusitis, unspecified: Secondary | ICD-10-CM

## 2015-07-04 DIAGNOSIS — I10 Essential (primary) hypertension: Secondary | ICD-10-CM

## 2015-07-04 DIAGNOSIS — D509 Iron deficiency anemia, unspecified: Secondary | ICD-10-CM

## 2015-07-04 LAB — CBC W/MCH & 3 PART DIFF
HCT: 35.7 % — ABNORMAL LOW (ref 36.0–46.0)
Hemoglobin: 11.5 g/dL — ABNORMAL LOW (ref 12.0–15.0)
LYMPHS ABS: 1.8 10*3/uL (ref 0.7–4.0)
LYMPHS PCT: 26 % (ref 12–46)
MCH: 26.7 pg (ref 26.0–34.0)
MCHC: 32.2 g/dL (ref 30.0–36.0)
MCV: 82.8 fL (ref 78.0–100.0)
NEUTROS ABS: 4.9 10*3/uL (ref 1.7–7.7)
NEUTROS PCT: 70 % (ref 43–77)
Platelets: 250 10*3/uL (ref 150–400)
RBC: 4.31 MIL/uL (ref 3.87–5.11)
RDW: 14.3 % (ref 11.5–15.5)
WBC mixed population: 0.3 10*3/uL (ref 0.1–1.8)
WBC: 7 10*3/uL (ref 4.0–10.5)
WBCMIXPER: 4 % (ref 3–18)

## 2015-07-04 LAB — COMPREHENSIVE METABOLIC PANEL
ALT: 18 U/L (ref 6–29)
AST: 16 U/L (ref 10–30)
Albumin: 3.4 g/dL — ABNORMAL LOW (ref 3.6–5.1)
Alkaline Phosphatase: 89 U/L (ref 33–115)
BUN: 14 mg/dL (ref 7–25)
CO2: 27 mmol/L (ref 20–31)
Calcium: 8.9 mg/dL (ref 8.6–10.2)
Chloride: 105 mmol/L (ref 98–110)
Creat: 0.8 mg/dL (ref 0.50–1.10)
Glucose, Bld: 79 mg/dL (ref 70–99)
Potassium: 4.1 mmol/L (ref 3.5–5.3)
Sodium: 140 mmol/L (ref 135–146)
Total Bilirubin: 0.3 mg/dL (ref 0.2–1.2)
Total Protein: 6.9 g/dL (ref 6.1–8.1)

## 2015-07-04 LAB — LIPID PANEL
CHOLESTEROL: 178 mg/dL (ref 125–200)
HDL: 48 mg/dL (ref >=46)
LDL Cholesterol: 116 mg/dL (ref <130)
Total CHOL/HDL Ratio: 3.7 Ratio (ref <=5.0)
Triglycerides: 69 mg/dL (ref <150)
VLDL: 14 mg/dL (ref <30)

## 2015-07-04 LAB — TSH: TSH: 1.12 m[IU]/L

## 2015-07-04 LAB — IRON: IRON: 62 ug/dL (ref 40–190)

## 2015-07-04 MED ORDER — ALPRAZOLAM 0.5 MG PO TABS: 0.5000 mg | ORAL_TABLET | Freq: Three times a day (TID) | ORAL | Status: DC | PRN

## 2015-07-04 MED ORDER — METHYLPREDNISOLONE ACETATE 40 MG/ML IJ SUSP
40.0000 mg | Freq: Once | INTRAMUSCULAR | Status: AC
  Administered 2015-07-04: 40 mg via INTRAMUSCULAR

## 2015-07-04 MED ORDER — LOSARTAN POTASSIUM-HCTZ 100-25 MG PO TABS: ORAL_TABLET | ORAL | Status: DC

## 2015-07-04 MED ORDER — LORATADINE 10 MG PO TABS: 10.0000 mg | ORAL_TABLET | Freq: Every day | ORAL | Status: DC

## 2015-07-04 MED FILL — CLEAR-ATADINE 10 MG TABLET: 10 | 90 days supply | Qty: 90 | Fill #0

## 2015-07-04 NOTE — Progress Notes (Signed)
Patient ID: Michelle Wilson, female   DOB: April 01, 1976, 40 y.o.   MRN: [id number]   Subjective:    Patient ID: Michelle Booksamille M Wilson, female    DOB: April 01, 1976, 40 y.o.   MRN: [id number]  Patient presents for Follow-up  patient here for follow-up on hypertension and weight. She is still being followed by the dietitian. She has not had any significant weight loss. She still trying to work with her schedule with exercise and meal planning. She is still taking her blood pressure medicine as prescribed. She also has underlying anxiety and uses alprazolam as needed.  She complains of congestion, no she was treated by E physician with azithromycin 3 days ago for sinusitis, request steroid shot which helps best also taking mucinex and claritin   Her mother recently had MI age 39 - smoker, hyperlipidemia   Review Of Systems:  GEN- denies fatigue, fever, weight loss,weakness, recent illness HEENT- denies eye drainage, change in vision,+ nasal discharge, CVS- denies chest pain, palpitations RESP- denies SOB, +cough, wheeze ABD- denies N/V, change in stools, abd pain GU- denies dysuria, hematuria, dribbling, incontinence MSK- denies joint pain, muscle aches, injury Neuro- denies headache, dizziness, syncope, seizure activity       Objective:    BP 126/86 mmHg  Pulse 82  Temp(Src) 98.4 F (36.9 C) (Oral)  Resp 18  Wt 256 lb (116.121 kg) GEN- NAD, alert and oriented x3 HEENT- PERRL, EOMI, non injected sclera, pink conjunctiva, MMM, oropharynx clear, TM clear bilat no effusion,  + maxillary sinus tenderness, inflammed turbinates,  Nasal drainage  Neck- Supple, no LAD CVS- RRR, no murmur RESP-CTAB Psych- norma affect and mood  EXT- No edema Pulses- Radial 2+         Assessment & Plan:      Problem List Items Addressed This Visit    Obesity    Time spent discussing her need for meal planning and exercise starting with the least 3 days a week. She initially asked about bariatric  surgery. We discussed that if she is not consistent with her nutrition and change in her eating habits that bariatric surgery is not going to be successful for her. She still needs to have those elements does help sustain her weight loss. She is still following with nutritionist      Relevant Orders   Lipid panel   Essential hypertension, benign - Primary    Blood pressure well controlled change her medication Checked last. Patient also requested thyroid be checked again      Relevant Medications   losartan-hydrochlorothiazide (HYZAAR) 100-25 MG tablet   Other Relevant Orders   Comprehensive metabolic panel   TSH   Lipid panel   Anxiety    Continue Xanax as needed      Relevant Medications   ALPRAZolam (XANAX) 0.5 MG tablet    Other Visit Diagnoses    Anemia, iron deficiency        Check iron stores and CBC    Relevant Orders    CBC w/[facility] & 3 Part Diff    Iron    Acute maxillary sinusitis, recurrence not specified        Complete zpak, continue claritin, Depo Medrol 40mg  given    Relevant Medications    loratadine (CLARITIN) 10 MG tablet       Note: This dictation was prepared with Dragon dictation along with smaller phrase technology. Any transcriptional errors that result from this process are unintentional.

## 2015-07-04 NOTE — Assessment & Plan Note (Signed)
Continue Xanax as needed 

## 2015-07-04 NOTE — Assessment & Plan Note (Signed)
Time spent discussing her need for meal planning and exercise starting with the least 3 days a week. She initially asked about bariatric surgery. We discussed that if she is not consistent with her nutrition and change in her eating habits that bariatric surgery is not going to be successful for her. She still needs to have those elements does help sustain her weight loss. She is still following with nutritionist

## 2015-07-04 NOTE — Patient Instructions (Signed)
Work on meal planning and exercise Continue current medications  F/U 6 months- Physical

## 2015-07-04 NOTE — Assessment & Plan Note (Signed)
Blood pressure well controlled change her medication Checked last. Patient also requested thyroid be checked again

## 2015-07-09 ENCOUNTER — Encounter: Payer: Self-pay | Admitting: Family Medicine

## 2015-07-31 DIAGNOSIS — Z0100 Encounter for examination of eyes and vision without abnormal findings: Secondary | ICD-10-CM | POA: Diagnosis not present

## 2015-08-08 ENCOUNTER — Encounter (HOSPITAL_COMMUNITY): Payer: Self-pay

## 2015-08-11 MED FILL — ALPRAZolam 0.5 MG TABS: 0.5 | 20 days supply | Qty: 60 | Fill #0

## 2015-09-01 ENCOUNTER — Encounter: Payer: Self-pay | Admitting: Family Medicine

## 2015-09-01 ENCOUNTER — Telehealth: Payer: 59 | Admitting: Nurse Practitioner

## 2015-09-01 DIAGNOSIS — J0101 Acute recurrent maxillary sinusitis: Secondary | ICD-10-CM

## 2015-09-01 MED ORDER — AMOXICILLIN-POT CLAVULANATE 875-125 MG PO TABS: 1.0000 | ORAL_TABLET | Freq: Two times a day (BID) | ORAL | Status: DC

## 2015-09-01 MED ORDER — LEVOFLOXACIN 500 MG PO TABS
500.0000 mg | ORAL_TABLET | Freq: Every day | ORAL | Status: DC
Start: 2015-09-01 — End: 2016-01-09

## 2015-09-01 NOTE — Addendum Note (Signed)
Addended by: Bennie PieriniMARTIN, MARY-MARGARET on: 09/01/2015 03:43 PM   Modules accepted: Orders

## 2015-09-01 NOTE — Progress Notes (Signed)

## 2015-09-02 ENCOUNTER — Ambulatory Visit: Payer: Self-pay | Admitting: Family Medicine

## 2015-09-05 MED FILL — ALPRAZolam 0.5 MG TABS: 0.5 | 20 days supply | Qty: 60 | Fill #1

## 2015-09-24 MED FILL — LOSARTAN-HCTZ 100-25 MG TAB: 100-25 | 90 days supply | Qty: 90 | Fill #0

## 2015-10-03 ENCOUNTER — Ambulatory Visit: Payer: 59 | Admitting: Nutrition

## 2015-10-03 MED FILL — ALPRAZolam 0.5 MG TABS: 0.5 | 20 days supply | Qty: 60 | Fill #2

## 2015-10-29 ENCOUNTER — Telehealth: Payer: 59 | Admitting: Nurse Practitioner

## 2015-10-29 DIAGNOSIS — J0101 Acute recurrent maxillary sinusitis: Secondary | ICD-10-CM

## 2015-10-29 MED ORDER — AMOXICILLIN-POT CLAVULANATE 875-125 MG PO TABS: 1.0000 | ORAL_TABLET | Freq: Two times a day (BID) | ORAL | Status: DC

## 2015-10-29 NOTE — Progress Notes (Signed)

## 2015-11-14 DIAGNOSIS — Z6841 Body Mass Index (BMI) 40.0 and over, adult: Secondary | ICD-10-CM | POA: Diagnosis not present

## 2015-11-14 DIAGNOSIS — Z01419 Encounter for gynecological examination (general) (routine) without abnormal findings: Secondary | ICD-10-CM | POA: Diagnosis not present

## 2015-12-19 ENCOUNTER — Ambulatory Visit: Payer: 59 | Admitting: Nutrition

## 2015-12-22 MED FILL — LOSARTAN-HCTZ 100-25 MG TAB: 100-25 | 90 days supply | Qty: 90 | Fill #1

## 2015-12-22 MED FILL — ALPRAZolam 0.5 MG TABS: 0.5 | 20 days supply | Qty: 60 | Fill #3

## 2016-01-09 ENCOUNTER — Encounter: Payer: 59 | Admitting: Family Medicine

## 2016-01-09 ENCOUNTER — Ambulatory Visit (INDEPENDENT_AMBULATORY_CARE_PROVIDER_SITE_OTHER): Payer: 59 | Admitting: Family Medicine

## 2016-01-09 ENCOUNTER — Encounter: Payer: Self-pay | Admitting: Family Medicine

## 2016-01-09 VITALS — BP 138/78 | HR 82 | Temp 98.7°F | Resp 16 | Ht 65.0 in | Wt 253.0 lb

## 2016-01-09 DIAGNOSIS — I10 Essential (primary) hypertension: Secondary | ICD-10-CM

## 2016-01-09 DIAGNOSIS — Z0000 Encounter for general adult medical examination without abnormal findings: Secondary | ICD-10-CM | POA: Diagnosis not present

## 2016-01-09 DIAGNOSIS — E669 Obesity, unspecified: Secondary | ICD-10-CM

## 2016-01-09 LAB — TSH: TSH: 0.84 mIU/L

## 2016-01-09 LAB — CBC WITH DIFFERENTIAL/PLATELET
BASOS PCT: 0 %
Basophils Absolute: 0 cells/uL (ref 0–200)
EOS ABS: 67 {cells}/uL (ref 15–500)
Eosinophils Relative: 1 %
HEMATOCRIT: 36.3 % (ref 35.0–45.0)
Hemoglobin: 11.9 g/dL — ABNORMAL LOW (ref 12.0–15.0)
LYMPHS ABS: 1943 {cells}/uL (ref 850–3900)
Lymphocytes Relative: 29 %
MCH: 27.2 pg (ref 27.0–33.0)
MCHC: 32.8 g/dL (ref 32.0–36.0)
MCV: 82.9 fL (ref 80.0–100.0)
MONO ABS: 402 {cells}/uL (ref 200–950)
MPV: 8.8 fL (ref 7.5–12.5)
Monocytes Relative: 6 %
NEUTROS ABS: 4288 {cells}/uL (ref 1500–7800)
Neutrophils Relative %: 64 %
Platelets: 277 10*3/uL (ref 140–400)
RBC: 4.38 MIL/uL (ref 3.80–5.10)
RDW: 14 % (ref 11.0–15.0)
WBC: 6.7 10*3/uL (ref 3.8–10.8)

## 2016-01-09 LAB — COMPREHENSIVE METABOLIC PANEL
ALK PHOS: 81 U/L (ref 33–115)
ALT: 20 U/L (ref 6–29)
AST: 18 U/L (ref 10–30)
Albumin: 3.6 g/dL (ref 3.6–5.1)
BILIRUBIN TOTAL: 0.3 mg/dL (ref 0.2–1.2)
BUN: 13 mg/dL (ref 7–25)
CALCIUM: 8.7 mg/dL (ref 8.6–10.2)
CO2: 27 mmol/L (ref 20–31)
Chloride: 102 mmol/L (ref 98–110)
Creat: 0.75 mg/dL (ref 0.50–1.10)
Glucose, Bld: 81 mg/dL (ref 70–99)
POTASSIUM: 3.6 mmol/L (ref 3.5–5.3)
Sodium: 137 mmol/L (ref 135–146)
TOTAL PROTEIN: 7.1 g/dL (ref 6.1–8.1)

## 2016-01-09 LAB — LIPID PANEL
CHOL/HDL RATIO: 3.5 ratio (ref <=5.0)
CHOLESTEROL: 175 mg/dL (ref 125–200)
HDL: 50 mg/dL (ref >=46)
LDL Cholesterol: 112 mg/dL (ref <130)
Triglycerides: 67 mg/dL (ref <150)
VLDL: 13 mg/dL (ref <30)

## 2016-01-09 NOTE — Patient Instructions (Addendum)
I recommend eye visit once a year I recommend dental visit every 6 months Goal is to  Exercise 30 minutes 5 days a week We will send a letter with lab results  Referral for Bariatric Surgery  Release of records- Dr. Normand Sloopillard [facility]  F/U 6 months

## 2016-01-09 NOTE — Progress Notes (Signed)
   Subjective:    Patient ID: Michelle Wilson, female    DOB: 01/04/1976, 40 y.o.   MRN: [id number]  Patient presents for Follow-up (is fasting)  Pt here for CPE with fasting labs  Due for PAP Smear with GYN   Immunizations UTD, will get flu shot at work  Obesity- Weight down 3lbs since our last visit, she is interested in weight loss surgery, has tried multiple different diets. No stimulant prescribed medications due to her hypertension and medications.  Has tried [employer], [place] [employer], has worked with nutritionist >6 months, Systems analystpersonal trainer , low carb/calorie restriction using [employer] with no significant weight loss or sustained weight loss.   Reviewed history and meds        Review Of Systems:  GEN- denies fatigue, fever, weight loss,weakness, recent illness HEENT- denies eye drainage, change in vision, nasal discharge, CVS- denies chest pain, palpitations RESP- denies SOB, cough, wheeze ABD- denies N/V, change in stools, abd pain GU- denies dysuria, hematuria, dribbling, incontinence MSK- denies joint pain, muscle aches, injury Neuro- denies headache, dizziness, syncope, seizure activity       Objective:    BP 138/78 (BP Location: Left Arm, Patient Position: Sitting, Cuff Size: Large)   Pulse 82   Temp 98.7 F (37.1 C) (Oral)   Resp 16   Ht 5\' 5"  (1.651 m)   Wt 253 lb (114.8 kg)   BMI 42.10 kg/m  GEN- NAD, alert and oriented x3,obese  HEENT- PERRL, EOMI, non injected sclera, pink conjunctiva, MMM, oropharynx clear Neck- Supple, no thyromegaly CVS- RRR, no murmur RESP-CTAB ABD-NABS,soft,NT,ND Psych- normal affect and mood EXT- No edema Pulses- Radial, DP- 2+        Assessment & Plan:      Problem List Items Addressed This Visit    Obesity    I think she would be a good candidate for weight loss surgery Will start referral process Pt directed to seminar page to register      Essential hypertension, benign    Controlled no change        Other  Visit Diagnoses    Routine general medical examination at a health care facility    -  Primary   GYN for PAP, fasting labs, flu shot at work   Relevant Orders   CBC with Differential/Platelet (Completed)   Comprehensive metabolic panel (Completed)   Lipid panel (Completed)   TSH (Completed)      Note: This dictation was prepared with Dragon dictation along with smaller phrase technology. Any transcriptional errors that result from this process are unintentional.

## 2016-01-11 ENCOUNTER — Encounter: Payer: Self-pay | Admitting: Family Medicine

## 2016-01-11 MED ORDER — ALPRAZOLAM 0.5 MG PO TABS
0.5000 mg | ORAL_TABLET | Freq: Three times a day (TID) | ORAL | 3 refills | Status: DC | PRN
Start: 2016-01-11 — End: 2016-05-07

## 2016-01-11 MED ORDER — LOSARTAN POTASSIUM-HCTZ 100-25 MG PO TABS: ORAL_TABLET | ORAL | 2 refills | Status: DC

## 2016-01-11 NOTE — Assessment & Plan Note (Signed)
I think she would be a good candidate for weight loss surgery Will start referral process Pt directed to seminar page to register

## 2016-01-11 NOTE — Assessment & Plan Note (Signed)
Controlled  no change

## 2016-01-14 MED FILL — ALPRAZolam 0.5 MG TABS: 0.5 | 30 days supply | Qty: 60 | Fill #0

## 2016-02-04 ENCOUNTER — Ambulatory Visit: Payer: 59 | Admitting: Nutrition

## 2016-02-06 ENCOUNTER — Other Ambulatory Visit (HOSPITAL_COMMUNITY): Payer: Self-pay | Admitting: Surgery

## 2016-02-12 ENCOUNTER — Encounter: Payer: Self-pay | Admitting: Family Medicine

## 2016-02-16 ENCOUNTER — Ambulatory Visit (HOSPITAL_COMMUNITY)
Admission: RE | Admit: 2016-02-16 | Discharge: 2016-02-16 | Disposition: A | Discharge status: Home / Self Care | Payer: 59 | Source: Ambulatory Visit | Attending: Surgery | Admitting: Surgery

## 2016-02-16 ENCOUNTER — Other Ambulatory Visit: Payer: Self-pay

## 2016-02-16 DIAGNOSIS — Z01818 Encounter for other preprocedural examination: Secondary | ICD-10-CM | POA: Insufficient documentation

## 2016-02-16 DIAGNOSIS — R918 Other nonspecific abnormal finding of lung field: Secondary | ICD-10-CM | POA: Diagnosis not present

## 2016-02-16 MED FILL — CLARITHROMYCIN 500 MG TAB: 500 | 14 days supply | Qty: 28 | Fill #0

## 2016-02-16 MED FILL — OMEPRAZOLE DR 20 MG CAPSULE: 20 | 30 days supply | Qty: 30 | Fill #0

## 2016-02-16 MED FILL — AMOXICILLIN 500 MG CAPSULE: 500 | 14 days supply | Qty: 56 | Fill #0

## 2016-02-20 ENCOUNTER — Ambulatory Visit: Payer: 59 | Admitting: Nutrition

## 2016-03-01 ENCOUNTER — Encounter: Payer: 59 | Attending: Surgery | Admitting: Dietician

## 2016-03-01 DIAGNOSIS — Z713 Dietary counseling and surveillance: Secondary | ICD-10-CM | POA: Diagnosis not present

## 2016-03-01 DIAGNOSIS — IMO0001 Reserved for inherently not codable concepts without codable children: Secondary | ICD-10-CM

## 2016-03-01 NOTE — Progress Notes (Signed)
  Pre-Op Assessment Visit:  Pre-Operative Sleeve Gastrectomy Surgery  Medical Nutrition Therapy:  Appt start time: 1435   End time:  1510.  Patient was seen on 03/01/2016 for Pre-Operative Nutrition Assessment. Assessment [facility] on 03/01/2016.   Preferred Learning Style:   No preference indicated   Learning Readiness:   Ready  Handouts given during visit include:  Pre-Op Goals Bariatric Surgery Protein Shakes   During the appointment today the following Pre-Op Goals were reviewed with the patient: Maintain or lose weight as instructed by your surgeon Make healthy food choices Begin to limit portion sizes Limited concentrated sugars and fried foods Keep fat/sugar in the single digits per serving on   food labels Practice CHEWING your food  (aim for 30 chews per bite or until applesauce consistency) Practice not drinking 15 minutes before, during, and 30 minutes after each meal/snack Avoid all carbonated beverages  Avoid/limit caffeinated beverages  Avoid all sugar-sweetened beverages Consume 3 meals per day; eat every 3-5 hours Make a list of non-food related activities Aim for 64-100 ounces of FLUID daily  Aim for at least 60-80 grams of PROTEIN daily Look for a liquid protein source that contain ?15 g protein and ?5 g carbohydrate  (ex: shakes, drinks, shots)  Patient-Centered Goals: She would like to feel better, improve her blood pressure, and have more energy  10 confidence  Demonstrated degree of understanding via:  Teach Back  Teaching Method Utilized:  Visual Auditory Hands on  Barriers to learning/adherence to lifestyle change: none  Patient to call the [facility] to enroll in Pre-Op and Post-Op Nutrition Education when surgery date is scheduled.

## 2016-03-01 NOTE — Patient Instructions (Signed)
Follow Pre-Op Goals Try Protein Shakes Call [facility] at [phone] when surgery is scheduled to enroll in Pre-Op Class  Things to remember:  Please always be honest with us. We want to support you!  If you have any questions or concerns in between appointments, please call or email Liz, Leslie, or Laurie.  The diet after surgery will be high protein and low in carbohydrate.  Vitamins and calcium need to be taken for the rest of your life.  Feel free to include support people in any classes or appointments.   Supplement recommendations:  Before Surgery   1 Complete Multivitamin with Iron  3000 IU Vitamin D3  After Surgery   2 Chewable Multivitamins  **Best Choice - Bariatric Advantage Advanced Multi EA      3 Chewable Calcium (500 mg each, total 1200-1500 mg per day)  **Best Choice - Celebrate, Bariatric Advantage, or Wellesse  Other Options:    2 Flinstones Complete + up to 100 mg Thiamin + 2000-3000 IU Vitamin D3 + 350-500 mcg Vitamin B12 + 30-45 mg Iron (with history of deficiency)  2 Celebrate MultiComplete with 18 mg Iron (this provides 6000 IU of  Vitamin D3)  4 Celebrate Essential Multi 2 in 1 (has calcium) + 18-60 mg separate  iron  Vitamins and Calcium are available at:    Outpatient Pharmacy   [address], [place], [place] [address]   [email or web address]  www.[email or web address]  www.[email or web address]   

## 2016-03-02 ENCOUNTER — Encounter: Payer: Self-pay | Admitting: Family Medicine

## 2016-03-08 ENCOUNTER — Encounter: Payer: Self-pay | Admitting: *Deleted

## 2016-03-08 ENCOUNTER — Encounter: Payer: Self-pay | Admitting: Family Medicine

## 2016-03-16 MED FILL — ALPRAZolam 0.5 MG TABS: 0.5 | 30 days supply | Qty: 60 | Fill #1

## 2016-03-16 MED FILL — LOSARTAN-HCTZ 100-25 MG TAB: 100-25 | 90 days supply | Qty: 90 | Fill #2

## 2016-03-24 ENCOUNTER — Encounter: Payer: 59 | Admitting: Dietician

## 2016-03-24 DIAGNOSIS — Z713 Dietary counseling and surveillance: Secondary | ICD-10-CM | POA: Diagnosis not present

## 2016-03-24 DIAGNOSIS — IMO0001 Reserved for inherently not codable concepts without codable children: Secondary | ICD-10-CM

## 2016-03-25 ENCOUNTER — Encounter: Payer: Self-pay | Admitting: Dietician

## 2016-03-25 NOTE — Progress Notes (Signed)
  Pre-Operative Nutrition Class:  Appt start time: 440   End time:  615  Patient was seen on 03/24/2016 for Pre-Operative Bariatric Surgery Education at the [facility].   Surgery date:  Surgery type: sleeve gastrectomy Start weight at [facility]: 248 lbs on 03/01/2016 Weight today: 250.6 lbs  TANITA  BODY COMP RESULTS  03/24/16   BMI (kg/m^2) 41.7   Fat Mass (lbs) 132.8   Fat Free Mass (lbs) 117.8   Total Body Water (lbs) 87   Samples given per MNT protocol. Patient educated on appropriate usage: Premier protein shake (vanilla - qty 1) Lot #: [id number] Exp: 01/2017  Celebrate Calcium Citrate chew (cherry - qty 1) Lot #: [id number] Exp: 06/2017  Bariatric Advantage Vitamins Multivitamin (mixed fruit - qty 1) Lot #: [id number] Exp: 02/2017  Unjury Protein Powder (chicken soup - qty 1) Lot #: [id number] Exp: 06/2017  The following the learning objectives were met by the patient during this course:  Identify Pre-Op Dietary Goals and will begin 2 weeks pre-operatively  Identify appropriate sources of fluids and proteins   State protein recommendations and appropriate sources pre and post-operatively  Identify Post-Operative Dietary Goals and will follow for 2 weeks post-operatively  Identify appropriate multivitamin and calcium sources  Describe the need for physical activity post-operatively and will follow MD recommendations  State when to call healthcare provider regarding medication questions or post-operative complications  Handouts given during class include:  Pre-Op Bariatric Surgery Diet Handout  Protein Shake Handout  Post-Op Bariatric Surgery Nutrition Handout  BELT Program Information Flyer  Support Group Information Flyer  [facility] Bariatric Supplements Price List  Follow-Up Plan: Patient will follow-up at [facility] 2 weeks post operatively for diet advancement per MD.

## 2016-04-01 NOTE — Progress Notes (Signed)
Scheduling pre op- please place SURGICAL ORDERS IN EPIC  Thanks 

## 2016-04-05 NOTE — Progress Notes (Signed)
Pt is scheduled for preop appt on 04/08/2016; please place surgical orders in epic. Thanks.  

## 2016-04-07 ENCOUNTER — Ambulatory Visit: Payer: Self-pay | Admitting: Surgery

## 2016-04-07 MED FILL — oxyCODONE HCL 5 MG/5ML SOLN: 5 | 4 days supply | Qty: 200 | Fill #0

## 2016-04-07 NOTE — Progress Notes (Signed)
ekg 10/17 epic

## 2016-04-07 NOTE — H&P (Signed)
Chief Complaint:  For sleeve gastrectomy  History of Present Illness:  Michelle Wilson is a 40 year old female who presents for a bariatric surgery evaluation. The patient and her husband came in today to discuss bariatric surgery having watched one of our seminars.  She is principally interested in having a lap band.  She does not want any stapled operations.  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  I discussed the lap band and indicated that it is not his performed is frequently as before but we have had patients that have had very good success with the lap band.  I then went on to show her how we did surgery and discussed the complications all limited to erosion and anterior slips.  She is aware of the need for careful follow-up and commitment for lap band fills.  She has tried numerous diets in her life including Weight Watchers on multiple occasions and other diet centers also Slim fast.  She is also to use the LAD can stop.  Only she's been able to lose up to 25 pounds but not has had sustained weight loss.  She denies any history of a hiatal hernia.  She doesn't have any symptoms of obstructive sleep apnea.  Her only surgery is removal of tubes and C-sections.  Her primary care doctor is Dr. Jeanice Limurham.  She has a BMI of 41 and a weight of 249.  Her comorbidities include hypertension which is treated with medications.  She had a normal UGI series.   She has decided to have a sleeve gastrectomy.  All questions were answered.  She is ready for her surgery this next Monday.     Past Medical History:  Diagnosis Date  . Anemia   . GERD (gastroesophageal reflux disease)    pregnancy  . Hypertension    Aldomet 250 mg Q day  . IBS (irritable bowel syndrome)    no issues currently  . Infection    Yeast inf; no frequent    Past Surgical History:  Procedure Laterality Date  . BILATERAL SALPINGECTOMY  05/31/2012   Procedure: BILATERAL SALPINGECTOMY;   Surgeon: Hal MoralesVanessa P Haygood, MD;  Location: [facility];  Service: Obstetrics;  Laterality: Bilateral;  . BREAST REDUCTION SURGERY  1997  . CESAREAN SECTION  2011  . CESAREAN SECTION  05/31/2012   Procedure: CESAREAN SECTION;  Surgeon: Hal MoralesVanessa P Haygood, MD;  Location: [facility];  Service: Obstetrics;  Laterality: N/A;    Current Outpatient Prescriptions  Medication Sig Dispense Refill  . ALPRAZolam (XANAX) 0.5 MG tablet Take 1 tablet (0.5 mg total) by mouth 3 (three) times daily as needed for anxiety. 60 tablet 3  . cholecalciferol (VITAMIN D) 1000 units tablet Take 1,000 Units by mouth daily.    Marland Kitchen. losartan-hydrochlorothiazide (HYZAAR) 100-25 MG tablet TAKE 1 TABLET BY MOUTH ONCE DAILY 90 tablet 2  . Prenatal Vit-Fe Sulfate-FA (PRENATAL VITAMIN PO) Take 1 tablet by mouth daily.     No current facility-administered medications for this visit.    Patient has no known allergies. Family History  Problem Relation Age of Onset  . Hypertension Mother   . Heart disease Mother 6358    Heart attack  . Hyperlipidemia Mother   . Hypertension Father   . Hypertension Maternal Grandmother   . Hypertension Maternal Grandfather   . Stroke Maternal Grandfather   . Hypertension Paternal Grandmother   . Hypertension Paternal Grandfather   . Hyperthyroidism Maternal  Aunt     x 3  . Schizophrenia Maternal Aunt   . Cancer Maternal Uncle     Lung deceased   Social History:   reports that she has never smoked. She has never used smokeless tobacco. She reports that she does not drink alcohol or use drugs.   REVIEW OF SYSTEMS : Negative except for see problem list  Physical Exam:   There were no vitals taken for this visit. There is no height or weight on file to calculate BMI.  Gen:  WDWN AAF NAD  Neurological: Alert and oriented to person, place, and time. Motor and sensory function is grossly intact  Head: Normocephalic and atraumatic.  Eyes: Conjunctivae are normal. Pupils are equal, round, and reactive  to light. No scleral icterus.  Neck: Normal range of motion. Neck supple. No tracheal deviation or thyromegaly present.  Cardiovascular:  SR without murmurs or gallops.  No carotid bruits Breast:  Not examined Respiratory: Effort normal.  No respiratory distress. No chest wall tenderness. Breath sounds normal.  No wheezes, rales or rhonchi.  Abdomen:  nontender GU:  Not examined Musculoskeletal: Normal range of motion. Extremities are nontender. No cyanosis, edema or clubbing noted Lymphadenopathy: No cervical, preauricular, postauricular or axillary adenopathy is present Skin: Skin is warm and dry. No rash noted. No diaphoresis. No erythema. No pallor. Pscyh: Normal mood and affect. Behavior is normal. Judgment and thought content normal.   LABORATORY RESULTS: No results found for this or any previous visit (from the past 48 hour(s)).   RADIOLOGY RESULTS: No results found.  Problem List: Patient Active Problem List   Diagnosis Date Noted  . Essential hypertension, benign 08/19/2011  . Obesity 08/19/2011  . Anxiety 08/19/2011  . Seasonal allergies 08/19/2011    Assessment & Plan: For sleeve gastrectomy    Matt B. Theresa Wedel, MD, FACS  [facility] [phone] beeper [phone]  04/07/2016 1:53 PM     

## 2016-04-07 NOTE — Patient Instructions (Addendum)
Michelle Wilson  04/07/2016   Your procedure is scheduled on:04/12/16   [facility]  Entrance take [facility]  elevators to 3rd floor to  Short Stay Center at     0800   AM.  Call this number if you have problems the morning of surgery [phone]   Remember: ONLY 1 PERSON MAY GO WITH YOU TO SHORT STAY TO GET  READY MORNING OF YOUR SURGERY.  Do not eat food or drink liquids :After Midnight.     Take these medicines the morning of surgery with A SIP OF WATER: NO REGULAR MEDICATIONS--MAY TAKE ALPRAZOLAM IF NEEDED DO NOT TAKE ANY DIABETIC MEDICATIONS DAY OF YOUR SURGERY                               You may not have any metal on your body including hair pins and              piercings  Do not wear jewelry, make-up, lotions, powders or perfumes, deodorant             Do not wear nail polish.  Do not shave  48 hours prior to surgery.              Men may shave face and neck.   Do not bring valuables to the hospital. [facility] IS NOT             RESPONSIBLE   FOR VALUABLES.  Contacts, dentures or bridgework may not be worn into surgery.  Leave suitcase in the car. After surgery it may be brought to your room.                  Please read over the following fact sheets you were given: _____________________________________________________________________             [facility] - Preparing [facility] Before surgery, you can play an important role.  Because skin is not sterile, your skin needs to be as free of germs as possible.  You can reduce the number of germs on your skin by washing with CHG (chlorahexidine gluconate) soap before surgery.  CHG is an antiseptic cleaner which kills germs and bonds with the skin to continue killing germs even after washing. Please DO NOT use if you have an allergy to CHG or antibacterial soaps.  If your skin becomes reddened/irritated stop using the CHG and inform your nurse when you arrive at Short Stay. Do not  shave (including legs and underarms) for at least 48 hours prior to the first CHG shower.  You may shave your face/neck. Please follow these instructions carefully:  1.  Shower with CHG Soap the night before surgery and the  morning of Surgery.  2.  If you choose to wash your hair, wash your hair first as usual with your  normal  shampoo.  3.  After you shampoo, rinse your hair and body thoroughly to remove the  shampoo.                           4.  Use CHG as you would any other liquid soap.  You can apply chg directly  to the skin and wash  Gently with a scrungie or clean washcloth.  5.  Apply the CHG Soap to your body ONLY FROM THE NECK DOWN.   Do not use on face/ open                           Wound or open sores. Avoid contact with eyes, ears mouth and genitals (private parts).                       Wash face,  Genitals (private parts) with your normal soap.             6.  Wash thoroughly, paying special attention to the area where your surgery  will be performed.  7.  Thoroughly rinse your body with warm water from the neck down.  8.  DO NOT shower/wash with your normal soap after using and rinsing off  the CHG Soap.                9.  Pat yourself dry with a clean towel.            10.  Wear clean pajamas.            11.  Place clean sheets on your bed the night of your first shower and do not  sleep with pets. Day of Surgery : Do not apply any lotions/deodorants the morning of surgery.  Please wear clean clothes to the hospital/surgery center.  FAILURE TO FOLLOW THESE INSTRUCTIONS MAY RESULT IN THE CANCELLATION OF YOUR SURGERY PATIENT SIGNATURE_________________________________  NURSE SIGNATURE__________________________________  ________________________________________________________________________

## 2016-04-08 ENCOUNTER — Encounter (HOSPITAL_COMMUNITY)
Admission: RE | Admit: 2016-04-08 | Discharge: 2016-04-08 | Disposition: A | Discharge status: Home / Self Care | Payer: 59 | Source: Ambulatory Visit | Attending: Surgery | Admitting: Surgery

## 2016-04-08 ENCOUNTER — Encounter (HOSPITAL_COMMUNITY): Payer: Self-pay

## 2016-04-08 DIAGNOSIS — E669 Obesity, unspecified: Secondary | ICD-10-CM | POA: Diagnosis not present

## 2016-04-08 DIAGNOSIS — Z01812 Encounter for preprocedural laboratory examination: Secondary | ICD-10-CM | POA: Insufficient documentation

## 2016-04-08 HISTORY — DX: Anxiety disorder, unspecified: F41.9

## 2016-04-08 LAB — COMPREHENSIVE METABOLIC PANEL
ALBUMIN: 3.8 g/dL (ref 3.5–5.0)
ALK PHOS: 78 U/L (ref 38–126)
ALT: 31 U/L (ref 14–54)
AST: 25 U/L (ref 15–41)
Anion gap: 10 (ref 5–15)
BILIRUBIN TOTAL: 0.6 mg/dL (ref 0.3–1.2)
BUN: 18 mg/dL (ref 6–20)
CALCIUM: 9.3 mg/dL (ref 8.9–10.3)
CO2: 27 mmol/L (ref 22–32)
CREATININE: 0.81 mg/dL (ref 0.44–1.00)
Chloride: 101 mmol/L (ref 101–111)
GFR calc Af Amer: >60 mL/min (ref >60)
GLUCOSE: 85 mg/dL (ref 65–99)
Potassium: 3.9 mmol/L (ref 3.5–5.1)
Sodium: 138 mmol/L (ref 135–145)
TOTAL PROTEIN: 8 g/dL (ref 6.5–8.1)

## 2016-04-08 LAB — CBC WITH DIFFERENTIAL/PLATELET
BASOS ABS: 0 10*3/uL (ref 0.0–0.1)
BASOS PCT: 0 %
Eosinophils Absolute: 0.1 10*3/uL (ref 0.0–0.7)
Eosinophils Relative: 1 %
HEMATOCRIT: 35.3 % — AB (ref 36.0–46.0)
HEMOGLOBIN: 12.2 g/dL (ref 12.0–15.0)
LYMPHS PCT: 27 %
Lymphs Abs: 2.5 10*3/uL (ref 0.7–4.0)
MCH: 27.7 pg (ref 26.0–34.0)
MCHC: 34.6 g/dL (ref 30.0–36.0)
MCV: 80.2 fL (ref 78.0–100.0)
MONO ABS: 0.5 10*3/uL (ref 0.1–1.0)
MONOS PCT: 6 %
NEUTROS ABS: 6.2 10*3/uL (ref 1.7–7.7)
NEUTROS PCT: 66 %
Platelets: 318 10*3/uL (ref 150–400)
RBC: 4.4 MIL/uL (ref 3.87–5.11)
RDW: 13.3 % (ref 11.5–15.5)
WBC: 9.3 10*3/uL (ref 4.0–10.5)

## 2016-04-08 MED FILL — PANTOPRAZOLE SOD DR 40 MG T: 40 | 30 days supply | Qty: 30 | Fill #0

## 2016-04-08 MED FILL — ONDANSETRON HCL 4 MG TABLET: 4 | 4 days supply | Qty: 15 | Fill #0

## 2016-04-12 ENCOUNTER — Encounter (HOSPITAL_COMMUNITY): Payer: Self-pay | Admitting: Registered Nurse

## 2016-04-12 ENCOUNTER — Inpatient Hospital Stay (HOSPITAL_COMMUNITY): Payer: 59 | Admitting: Anesthesiology

## 2016-04-12 ENCOUNTER — Encounter (HOSPITAL_COMMUNITY)
Admission: RE | Disposition: A | Discharge status: Home / Self Care | Payer: Self-pay | Source: Ambulatory Visit | Attending: Surgery

## 2016-04-12 ENCOUNTER — Inpatient Hospital Stay (HOSPITAL_COMMUNITY)
Admission: RE | Admit: 2016-04-12 | Discharge: 2016-04-14 | DRG: 621 | Disposition: A | Discharge status: Home / Self Care | Payer: 59 | Source: Ambulatory Visit | Attending: Surgery | Admitting: Surgery

## 2016-04-12 DIAGNOSIS — Z6841 Body Mass Index (BMI) 40.0 and over, adult: Secondary | ICD-10-CM

## 2016-04-12 DIAGNOSIS — F419 Anxiety disorder, unspecified: Secondary | ICD-10-CM | POA: Diagnosis present

## 2016-04-12 DIAGNOSIS — J302 Other seasonal allergic rhinitis: Secondary | ICD-10-CM | POA: Diagnosis not present

## 2016-04-12 DIAGNOSIS — K219 Gastro-esophageal reflux disease without esophagitis: Secondary | ICD-10-CM | POA: Diagnosis not present

## 2016-04-12 DIAGNOSIS — Z9889 Other specified postprocedural states: Secondary | ICD-10-CM | POA: Diagnosis not present

## 2016-04-12 DIAGNOSIS — I10 Essential (primary) hypertension: Secondary | ICD-10-CM | POA: Diagnosis present

## 2016-04-12 DIAGNOSIS — K2950 Unspecified chronic gastritis without bleeding: Secondary | ICD-10-CM | POA: Diagnosis not present

## 2016-04-12 DIAGNOSIS — Z9884 Bariatric surgery status: Secondary | ICD-10-CM

## 2016-04-12 HISTORY — PX: LAPAROSCOPIC GASTRIC SLEEVE RESECTION: SHX5895

## 2016-04-12 LAB — CBC
HCT: 33 % — ABNORMAL LOW (ref 36.0–46.0)
HEMATOCRIT: 32.8 % — AB (ref 36.0–46.0)
Hemoglobin: 11.3 g/dL — ABNORMAL LOW (ref 12.0–15.0)
Hemoglobin: 11.3 g/dL — ABNORMAL LOW (ref 12.0–15.0)
MCH: 27.6 pg (ref 26.0–34.0)
MCH: 27.7 pg (ref 26.0–34.0)
MCHC: 34.5 g/dL (ref 30.0–36.0)
MCHC: 34.2 g/dL (ref 30.0–36.0)
MCV: 80.2 fL (ref 78.0–100.0)
MCV: 80.9 fL (ref 78.0–100.0)
PLATELETS: 276 10*3/uL (ref 150–400)
Platelets: 302 10*3/uL (ref 150–400)
RBC: 4.09 MIL/uL (ref 3.87–5.11)
RBC: 4.08 MIL/uL (ref 3.87–5.11)
RDW: 13.4 % (ref 11.5–15.5)
RDW: 13.3 % (ref 11.5–15.5)
WBC: 13.7 10*3/uL — ABNORMAL HIGH (ref 4.0–10.5)
WBC: 12.2 10*3/uL — ABNORMAL HIGH (ref 4.0–10.5)

## 2016-04-12 LAB — PREGNANCY, URINE: PREG TEST UR: NEGATIVE

## 2016-04-12 LAB — TYPE AND SCREEN
ABO/RH(D): B POS
ANTIBODY SCREEN: NEGATIVE

## 2016-04-12 LAB — MRSA PCR SCREENING: MRSA BY PCR: NEGATIVE

## 2016-04-12 LAB — ABO/RH: ABO/RH(D): B POS

## 2016-04-12 SURGERY — GASTRECTOMY, SLEEVE, LAPAROSCOPIC
Anesthesia: General

## 2016-04-12 MED ORDER — BUPIVACAINE LIPOSOME 1.3 % IJ SUSP
INTRAMUSCULAR | Status: DC | PRN
  Administered 2016-04-12: 20 mL

## 2016-04-12 MED ORDER — HEPARIN SODIUM (PORCINE) 5000 UNIT/ML IJ SOLN
5000.0000 [IU] | INTRAMUSCULAR | Status: AC
  Administered 2016-04-12: 5000 [IU] via SUBCUTANEOUS
  Filled 2016-04-12: qty 1

## 2016-04-12 MED ORDER — SUFENTANIL CITRATE 50 MCG/ML IV SOLN
INTRAVENOUS | Status: AC
  Filled 2016-04-12: qty 1

## 2016-04-12 MED ORDER — PROMETHAZINE HCL 25 MG/ML IJ SOLN: 6.2500 mg | INTRAMUSCULAR | Status: DC | PRN

## 2016-04-12 MED ORDER — SUFENTANIL CITRATE 50 MCG/ML IV SOLN
INTRAVENOUS | Status: DC | PRN
  Administered 2016-04-12: 10 ug via INTRAVENOUS
  Administered 2016-04-12: 5 ug via INTRAVENOUS
  Administered 2016-04-12 (×3): 10 ug via INTRAVENOUS
  Administered 2016-04-12: 5 ug via INTRAVENOUS
  Administered 2016-04-12: 10 ug via INTRAVENOUS
  Administered 2016-04-12: 5 ug via INTRAVENOUS
  Administered 2016-04-12: 10 ug via INTRAVENOUS
  Administered 2016-04-12: 5 ug via INTRAVENOUS

## 2016-04-12 MED ORDER — PROPOFOL 10 MG/ML IV BOLUS
INTRAVENOUS | Status: DC | PRN
  Administered 2016-04-12: 220 mg via INTRAVENOUS

## 2016-04-12 MED ORDER — DEXAMETHASONE SODIUM PHOSPHATE 10 MG/ML IJ SOLN
INTRAMUSCULAR | Status: DC | PRN
  Administered 2016-04-12: 10 mg via INTRAVENOUS

## 2016-04-12 MED ORDER — HYDROMORPHONE HCL 2 MG/ML IJ SOLN
0.2500 mg | INTRAMUSCULAR | Status: DC | PRN
  Administered 2016-04-12: 0.5 mg via INTRAVENOUS

## 2016-04-12 MED ORDER — SUGAMMADEX SODIUM 200 MG/2ML IV SOLN
INTRAVENOUS | Status: DC | PRN
  Administered 2016-04-12: 200 mg via INTRAVENOUS

## 2016-04-12 MED ORDER — ONDANSETRON HCL 4 MG/2ML IJ SOLN
INTRAMUSCULAR | Status: AC
  Filled 2016-04-12: qty 2

## 2016-04-12 MED ORDER — HYDROMORPHONE HCL 2 MG/ML IJ SOLN
INTRAMUSCULAR | Status: AC
  Filled 2016-04-12: qty 1

## 2016-04-12 MED ORDER — ROCURONIUM BROMIDE 100 MG/10ML IV SOLN
INTRAVENOUS | Status: DC | PRN
  Administered 2016-04-12: 5 mg via INTRAVENOUS
  Administered 2016-04-12: 10 mg via INTRAVENOUS
  Administered 2016-04-12: 45 mg via INTRAVENOUS

## 2016-04-12 MED ORDER — MIDAZOLAM HCL 5 MG/5ML IJ SOLN
INTRAMUSCULAR | Status: DC | PRN
  Administered 2016-04-12: 1 mg via INTRAVENOUS
  Administered 2016-04-12: 2 mg via INTRAVENOUS
  Administered 2016-04-12: 1 mg via INTRAVENOUS

## 2016-04-12 MED ORDER — BUPIVACAINE LIPOSOME 1.3 % IJ SUSP
20.0000 mL | Freq: Once | INTRAMUSCULAR | Status: DC
  Filled 2016-04-12: qty 20

## 2016-04-12 MED ORDER — OXYCODONE HCL 5 MG/5ML PO SOLN: 5.0000 mg | ORAL | Status: DC | PRN

## 2016-04-12 MED ORDER — ROCURONIUM BROMIDE 50 MG/5ML IV SOSY
PREFILLED_SYRINGE | INTRAVENOUS | Status: AC
  Filled 2016-04-12: qty 5

## 2016-04-12 MED ORDER — CHLORHEXIDINE GLUCONATE CLOTH 2 % EX PADS: 6.0000 | MEDICATED_PAD | Freq: Once | CUTANEOUS | Status: DC

## 2016-04-12 MED ORDER — 0.9 % SODIUM CHLORIDE (POUR BTL) OPTIME
TOPICAL | Status: DC | PRN
  Administered 2016-04-12: 1000 mL

## 2016-04-12 MED ORDER — MIDAZOLAM HCL 2 MG/2ML IJ SOLN
INTRAMUSCULAR | Status: AC
  Filled 2016-04-12: qty 2

## 2016-04-12 MED ORDER — ACETAMINOPHEN 160 MG/5ML PO SOLN: 325.0000 mg | ORAL | Status: DC | PRN

## 2016-04-12 MED ORDER — ACETAMINOPHEN 160 MG/5ML PO SOLN
650.0000 mg | ORAL | Status: DC | PRN
  Administered 2016-04-13: 650 mg via ORAL
  Filled 2016-04-12: qty 20.3

## 2016-04-12 MED ORDER — LACTATED RINGERS IR SOLN
Status: DC | PRN
  Administered 2016-04-12: 3000 mL

## 2016-04-12 MED ORDER — LIP MEDEX EX OINT
TOPICAL_OINTMENT | CUTANEOUS | Status: AC
  Filled 2016-04-12: qty 7

## 2016-04-12 MED ORDER — SODIUM CHLORIDE 0.9 % IJ SOLN
INTRAMUSCULAR | Status: AC
  Filled 2016-04-12: qty 10

## 2016-04-12 MED ORDER — ACETAMINOPHEN 10 MG/ML IV SOLN
INTRAVENOUS | Status: DC | PRN
  Administered 2016-04-12: 1000 mg via INTRAVENOUS

## 2016-04-12 MED ORDER — ACETAMINOPHEN 10 MG/ML IV SOLN
INTRAVENOUS | Status: AC
  Filled 2016-04-12: qty 100

## 2016-04-12 MED ORDER — LACTATED RINGERS IV SOLN
INTRAVENOUS | Status: DC | PRN
  Administered 2016-04-12 (×3): via INTRAVENOUS

## 2016-04-12 MED ORDER — SODIUM CHLORIDE 0.9 % IJ SOLN
INTRAMUSCULAR | Status: AC
  Filled 2016-04-12: qty 20

## 2016-04-12 MED ORDER — PROPOFOL 10 MG/ML IV BOLUS
INTRAVENOUS | Status: AC
  Filled 2016-04-12: qty 40

## 2016-04-12 MED ORDER — CEFOTETAN DISODIUM-DEXTROSE 2-2.08 GM-% IV SOLR
INTRAVENOUS | Status: AC
  Filled 2016-04-12: qty 50

## 2016-04-12 MED ORDER — ORAL CARE MOUTH RINSE
15.0000 mL | Freq: Two times a day (BID) | OROMUCOSAL | Status: DC
  Administered 2016-04-13 (×2): 15 mL via OROMUCOSAL

## 2016-04-12 MED ORDER — DEXAMETHASONE SODIUM PHOSPHATE 10 MG/ML IJ SOLN
INTRAMUSCULAR | Status: AC
  Filled 2016-04-12: qty 1

## 2016-04-12 MED ORDER — SUCCINYLCHOLINE CHLORIDE 20 MG/ML IJ SOLN
INTRAMUSCULAR | Status: DC | PRN
  Administered 2016-04-12: 100 mg via INTRAVENOUS

## 2016-04-12 MED ORDER — PANTOPRAZOLE SODIUM 40 MG IV SOLR
40.0000 mg | Freq: Every day | INTRAVENOUS | Status: DC
  Administered 2016-04-12 – 2016-04-13 (×2): 40 mg via INTRAVENOUS
  Filled 2016-04-12 (×2): qty 40

## 2016-04-12 MED ORDER — ONDANSETRON HCL 4 MG/2ML IJ SOLN
INTRAMUSCULAR | Status: DC | PRN
  Administered 2016-04-12: 4 mg via INTRAVENOUS

## 2016-04-12 MED ORDER — DEXTROSE IN LACTATED RINGERS 5 % IV SOLN
INTRAVENOUS | Status: DC
Start: 2016-04-12 — End: 2016-04-14
  Administered 2016-04-12 (×2): via INTRAVENOUS
  Administered 2016-04-13: 1000 mL via INTRAVENOUS
  Administered 2016-04-13: 16:00:00 via INTRAVENOUS
  Administered 2016-04-14: 10 mL/h via INTRAVENOUS

## 2016-04-12 MED ORDER — APREPITANT 80 MG PO CAPS
80.0000 mg | ORAL_CAPSULE | ORAL | Status: AC
  Administered 2016-04-12: 80 mg via ORAL
  Filled 2016-04-12: qty 1

## 2016-04-12 MED ORDER — LIDOCAINE HCL (CARDIAC) 20 MG/ML IV SOLN
INTRAVENOUS | Status: DC | PRN
  Administered 2016-04-12: 75 mg via INTRAVENOUS
  Administered 2016-04-12: 25 mg via INTRATRACHEAL

## 2016-04-12 MED ORDER — ONDANSETRON HCL 4 MG/2ML IJ SOLN
4.0000 mg | INTRAMUSCULAR | Status: DC | PRN
  Administered 2016-04-12: 4 mg via INTRAVENOUS
  Filled 2016-04-12 (×2): qty 2

## 2016-04-12 MED ORDER — LACTATED RINGERS IV SOLN
INTRAVENOUS | Status: DC
  Administered 2016-04-12: 1000 mL via INTRAVENOUS

## 2016-04-12 MED ORDER — CEFOTETAN DISODIUM-DEXTROSE 2-2.08 GM-% IV SOLR
2.0000 g | INTRAVENOUS | Status: AC
  Administered 2016-04-12: 2 g via INTRAVENOUS

## 2016-04-12 MED ORDER — MORPHINE SULFATE (PF) 2 MG/ML IV SOLN
2.0000 mg | INTRAVENOUS | Status: DC | PRN
  Administered 2016-04-12 (×3): 2 mg via INTRAVENOUS
  Filled 2016-04-12 (×4): qty 1

## 2016-04-12 SURGICAL SUPPLY — 81 items
ADH SKN CLS APL DERMABOND .7 (GAUZE/BANDAGES/DRESSINGS) ×1
APL ESCP 34 STRL LF DISP (HEMOSTASIS) ×1
APL SRG 32X5 SNPLK LF DISP (MISCELLANEOUS)
APPLICATOR COTTON TIP 6IN STRL (MISCELLANEOUS) IMPLANT
APPLICATOR SURGIFLO ENDO (HEMOSTASIS) ×2 IMPLANT
APPLIER CLIP 5 13 M/L LIGAMAX5 (MISCELLANEOUS)
APPLIER CLIP ROT 10 11.4 M/L (STAPLE)
APPLIER CLIP ROT 13.4 12 LRG (CLIP) ×3
APR CLP LRG 13.4X12 ROT 20 MLT (CLIP) ×1
APR CLP MED LRG 11.4X10 (STAPLE)
APR CLP MED LRG 5 ANG JAW (MISCELLANEOUS)
BAG SPEC RTRVL LRG 6X4 10 (ENDOMECHANICALS) ×1
BLADE SURG 15 STRL LF DISP TIS (BLADE) ×1 IMPLANT
BLADE SURG 15 STRL SS (BLADE) ×3
CABLE HIGH FREQUENCY MONO STRZ (ELECTRODE) ×3 IMPLANT
CLIP APPLIE 5 13 M/L LIGAMAX5 (MISCELLANEOUS) IMPLANT
CLIP APPLIE ROT 10 11.4 M/L (STAPLE) IMPLANT
CLIP APPLIE ROT 13.4 12 LRG (CLIP) IMPLANT
COVER SURGICAL LIGHT HANDLE (MISCELLANEOUS) ×1 IMPLANT
DERMABOND ADVANCED (GAUZE/BANDAGES/DRESSINGS) ×2
DERMABOND ADVANCED .7 DNX12 (GAUZE/BANDAGES/DRESSINGS) IMPLANT
DEVICE SUT QUICK LOAD TK 5 (STAPLE) IMPLANT
DEVICE SUT TI-KNOT TK 5X26 (MISCELLANEOUS) IMPLANT
DEVICE SUTURE ENDOST 10MM (ENDOMECHANICALS) IMPLANT
DEVICE TI KNOT TK5 (MISCELLANEOUS)
DEVICE TROCAR PUNCTURE CLOSURE (ENDOMECHANICALS) ×3 IMPLANT
DISSECTOR BLUNT TIP ENDO 5MM (MISCELLANEOUS) IMPLANT
ELECT REM PT RETURN 9FT ADLT (ELECTROSURGICAL) ×3
ELECTRODE REM PT RTRN 9FT ADLT (ELECTROSURGICAL) ×1 IMPLANT
GAUZE SPONGE 4X4 12PLY STRL (GAUZE/BANDAGES/DRESSINGS) IMPLANT
GAUZE SPONGE 4X4 16PLY XRAY LF (GAUZE/BANDAGES/DRESSINGS) ×2 IMPLANT
GLOVE BIOGEL M 8.0 STRL (GLOVE) ×3 IMPLANT
GOWN STRL REUS W/TWL XL LVL3 (GOWN DISPOSABLE) ×12 IMPLANT
HANDLE STAPLE EGIA 4 XL (STAPLE) ×3 IMPLANT
HOLDER FOLEY CATH W/STRAP (MISCELLANEOUS) ×2 IMPLANT
HOVERMATT SINGLE USE (MISCELLANEOUS) ×3 IMPLANT
IRRIG SUCT STRYKERFLOW 2 WTIP (MISCELLANEOUS)
IRRIGATION SUCT STRKRFLW 2 WTP (MISCELLANEOUS) ×1 IMPLANT
KIT BASIN OR (CUSTOM PROCEDURE TRAY) ×3 IMPLANT
MARKER SKIN DUAL TIP RULER LAB (MISCELLANEOUS) ×3 IMPLANT
NDL SPNL 22GX3.5 QUINCKE BK (NEEDLE) ×1 IMPLANT
NEEDLE SPNL 22GX3.5 QUINCKE BK (NEEDLE) ×3 IMPLANT
PACK UNIVERSAL I (CUSTOM PROCEDURE TRAY) ×3 IMPLANT
POUCH SPECIMEN RETRIEVAL 10MM (ENDOMECHANICALS) ×2 IMPLANT
QUICK LOAD TK 5 (STAPLE)
RELOAD STAPLE 45 PURP MED/THCK (STAPLE) IMPLANT
RELOAD TRI 45 ART MED THCK BLK (STAPLE) ×3 IMPLANT
RELOAD TRI 45 ART MED THCK PUR (STAPLE) IMPLANT
RELOAD TRI 60 ART MED THCK BLK (STAPLE) ×3 IMPLANT
RELOAD TRI 60 ART MED THCK PUR (STAPLE) ×9 IMPLANT
SCISSORS LAP 5X45 EPIX DISP (ENDOMECHANICALS) ×2 IMPLANT
SEALANT SURGICAL APPL DUAL CAN (MISCELLANEOUS) IMPLANT
SET IRRIG TUBING LAPAROSCOPIC (IRRIGATION / IRRIGATOR) ×2 IMPLANT
SHEARS HARMONIC ACE PLUS 45CM (MISCELLANEOUS) ×3 IMPLANT
SHEARS HARMONIC HDI 36CM (ELECTROSURGICAL) ×2 IMPLANT
SLEEVE ADV FIXATION 5X100MM (TROCAR) ×6 IMPLANT
SLEEVE GASTRECTOMY 36FR VISIGI (MISCELLANEOUS) ×3 IMPLANT
SOLUTION ANTI FOG 6CC (MISCELLANEOUS) ×3 IMPLANT
SPONGE LAP 18X18 X RAY DECT (DISPOSABLE) ×3 IMPLANT
STAPLER VISISTAT 35W (STAPLE) ×3 IMPLANT
SURGIFLO W/THROMBIN 8M KIT (HEMOSTASIS) ×2 IMPLANT
SUT SURGIDAC NAB ES-9 0 48 120 (SUTURE) IMPLANT
SUT VIC AB 4-0 SH 18 (SUTURE) ×3 IMPLANT
SUT VICRYL 0 TIES 12 18 (SUTURE) ×5 IMPLANT
SYR 10ML ECCENTRIC (SYRINGE) ×3 IMPLANT
SYR 20CC LL (SYRINGE) ×3 IMPLANT
SYR 50ML LL SCALE MARK (SYRINGE) ×3 IMPLANT
SYSTEM WECK SHIELD CLOSURE (TROCAR) IMPLANT
TOWEL OR 17X26 10 PK STRL BLUE (TOWEL DISPOSABLE) ×6 IMPLANT
TOWEL OR NON WOVEN STRL DISP B (DISPOSABLE) ×3 IMPLANT
TRAY FOLEY CATH 14FRSI W/METER (CATHETERS) ×2 IMPLANT
TRAY FOLEY W/METER SILVER 16FR (SET/KITS/TRAYS/PACK) IMPLANT
TROCAR ADV FIXATION 12X100MM (TROCAR) ×3 IMPLANT
TROCAR ADV FIXATION 5X100MM (TROCAR) ×3 IMPLANT
TROCAR BLADELESS 15MM (ENDOMECHANICALS) ×3 IMPLANT
TROCAR BLADELESS OPT 5 100 (ENDOMECHANICALS) ×3 IMPLANT
TUBE CALIBRATION LAPBAND (TUBING) IMPLANT
TUBING CONNECTING 10 (TUBING) ×2 IMPLANT
TUBING CONNECTING 10' (TUBING) ×1
TUBING ENDO SMARTCAP (MISCELLANEOUS) ×3 IMPLANT
TUBING INSUF HEATED (TUBING) ×3 IMPLANT

## 2016-04-12 NOTE — H&P (View-Only) (Signed)
Chief Complaint:  For sleeve gastrectomy  History of Present Illness:  Michelle Wilson is a 40 year old female who presents for a bariatric surgery evaluation. The patient and her husband came in today to discuss bariatric surgery having watched one of our seminars.  She is principally interested in having a lap band.  She does not want any stapled operations.  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  I discussed the lap band and indicated that it is not his performed is frequently as before but we have had patients that have had very good success with the lap band.  I then went on to show her how we did surgery and discussed the complications all limited to erosion and anterior slips.  She is aware of the need for careful follow-up and commitment for lap band fills.  She has tried numerous diets in her life including Weight Watchers on multiple occasions and other diet centers also Slim fast.  She is also to use the LAD can stop.  Only she's been able to lose up to 25 pounds but not has had sustained weight loss.  She denies any history of a hiatal hernia.  She doesn't have any symptoms of obstructive sleep apnea.  Her only surgery is removal of tubes and C-sections.  Her primary care doctor is Dr. Jeanice Limurham.  She has a BMI of 41 and a weight of 249.  Her comorbidities include hypertension which is treated with medications.  She had a normal UGI series.   She has decided to have a sleeve gastrectomy.  All questions were answered.  She is ready for her surgery this next Monday.     Past Medical History:  Diagnosis Date  . Anemia   . GERD (gastroesophageal reflux disease)    pregnancy  . Hypertension    Aldomet 250 mg Q day  . IBS (irritable bowel syndrome)    no issues currently  . Infection    Yeast inf; no frequent    Past Surgical History:  Procedure Laterality Date  . BILATERAL SALPINGECTOMY  05/31/2012   Procedure: BILATERAL SALPINGECTOMY;   Surgeon: Hal MoralesVanessa P Haygood, MD;  Location: [facility];  Service: Obstetrics;  Laterality: Bilateral;  . BREAST REDUCTION SURGERY  1997  . CESAREAN SECTION  2011  . CESAREAN SECTION  05/31/2012   Procedure: CESAREAN SECTION;  Surgeon: Hal MoralesVanessa P Haygood, MD;  Location: [facility];  Service: Obstetrics;  Laterality: N/A;    Current Outpatient Prescriptions  Medication Sig Dispense Refill  . ALPRAZolam (XANAX) 0.5 MG tablet Take 1 tablet (0.5 mg total) by mouth 3 (three) times daily as needed for anxiety. 60 tablet 3  . cholecalciferol (VITAMIN D) 1000 units tablet Take 1,000 Units by mouth daily.    Marland Kitchen. losartan-hydrochlorothiazide (HYZAAR) 100-25 MG tablet TAKE 1 TABLET BY MOUTH ONCE DAILY 90 tablet 2  . Prenatal Vit-Fe Sulfate-FA (PRENATAL VITAMIN PO) Take 1 tablet by mouth daily.     No current facility-administered medications for this visit.    Patient has no known allergies. Family History  Problem Relation Age of Onset  . Hypertension Mother   . Heart disease Mother 6358    Heart attack  . Hyperlipidemia Mother   . Hypertension Father   . Hypertension Maternal Grandmother   . Hypertension Maternal Grandfather   . Stroke Maternal Grandfather   . Hypertension Paternal Grandmother   . Hypertension Paternal Grandfather   . Hyperthyroidism Maternal  Aunt     x 3  . Schizophrenia Maternal Aunt   . Cancer Maternal Uncle     Lung deceased   Social History:   reports that she has never smoked. She has never used smokeless tobacco. She reports that she does not drink alcohol or use drugs.   REVIEW OF SYSTEMS : Negative except for see problem list  Physical Exam:   There were no vitals taken for this visit. There is no height or weight on file to calculate BMI.  Gen:  WDWN AAF NAD  Neurological: Alert and oriented to person, place, and time. Motor and sensory function is grossly intact  Head: Normocephalic and atraumatic.  Eyes: Conjunctivae are normal. Pupils are equal, round, and reactive  to light. No scleral icterus.  Neck: Normal range of motion. Neck supple. No tracheal deviation or thyromegaly present.  Cardiovascular:  SR without murmurs or gallops.  No carotid bruits Breast:  Not examined Respiratory: Effort normal.  No respiratory distress. No chest wall tenderness. Breath sounds normal.  No wheezes, rales or rhonchi.  Abdomen:  nontender GU:  Not examined Musculoskeletal: Normal range of motion. Extremities are nontender. No cyanosis, edema or clubbing noted Lymphadenopathy: No cervical, preauricular, postauricular or axillary adenopathy is present Skin: Skin is warm and dry. No rash noted. No diaphoresis. No erythema. No pallor. Pscyh: Normal mood and affect. Behavior is normal. Judgment and thought content normal.   LABORATORY RESULTS: No results found for this or any previous visit (from the past 48 hour(s)).   RADIOLOGY RESULTS: No results found.  Problem List: Patient Active Problem List   Diagnosis Date Noted  . Essential hypertension, benign 08/19/2011  . Obesity 08/19/2011  . Anxiety 08/19/2011  . Seasonal allergies 08/19/2011    Assessment & Plan: For sleeve gastrectomy    Matt B. Daphine DeutscherMartin, MD, [facility]  [facility] [phone] beeper [phone]  04/07/2016 1:53 PM

## 2016-04-12 NOTE — Op Note (Signed)
Surgeon: Wenda LowMatt Aahna Rossa, MD, FACS  Asst:  Ovidio Kinavid Newman, MD, FACS  Anes:  General endotracheal  Procedure: Laparoscopic sleeve gastrectomy and upper endoscopy  Diagnosis: Morbid obesity  Complications: Splenic bleeding  EBL:   400 cc  Description of Procedure:  The patient was take to OR 1 and given general anesthesia.  The abdomen was prepped with PCMX and draped sterilely.  A timeout was performed.  Access to the abdomen was achieved with a 5 mm Optiview through the left upper quadrant.  Following insufflation, the state of the abdomen was found to be free of adhesions.  The ViSiGi 36Fr tube was inserted to deflate the stomach and was pulled back into the esophagus.    The pylorus was identified and we measured 5 cm back and marked the antrum.  At that point we began dissection to take down the greater curvature of the stomach using the Harmonic scalpel.  This dissection was taken all the way up to the left crus. The last inch of the stomach was intimately attached to the hilum of the superior pole of the spleen.  Overall the spleen was on the large size. This was taken down including some prominent vessels to the stomach with the Harmonic scalpel.  Posterior attachments of the stomach were also taken down.    The ViSiGi tube was then passed into the antrum and suction applied so that it was snug along the lessor curvature.  The "crow's foot" or incisura was identified.  The sleeve gastrectomy was begun using the [employer] InternationalCovidien platform stapler beginning with a 4.5 cm black load with TRS followed by a 6 cm black load with TRS.  The remainder of the sleeve was performed with 6 cm purple loads with TRS.  On the next to the last firing we noticed some bleeding from the splenic bed.  The stomach was detached and attention given to the splenic bed.  Harmonic scalpel was applied with some diminishment in the bleeding.  A 4 x 4 gauze was applied with direct pressure.  This was followed by Alexis FrockSurgi Flow and  Surgicel.   This area was observed while endoscopy was performed and no active bleeding was noted.  Omentum was placed on top of the Surgicel.    When the sleeve was complete the tube was taken off suction and insufflated briefly.  The tube was withdrawn.  Upper endoscopy was then performed by Dr. Ezzard StandingNewman.     The specimen was extracted through the 15 trocar site.  Wounds were infiltrated with Exparel and closed with 4-0 Monocryl after observing the splenic area for 20 min.   Matt B. Daphine DeutscherMartin, MD, [facility], [place] [phone]

## 2016-04-12 NOTE — Anesthesia Postprocedure Evaluation (Signed)
Anesthesia Post Note  Patient: Michelle Wilson  Procedure(s) Performed: Procedure(s) (LRB): LAPAROSCOPIC GASTRIC SLEEVE RESECTION, UPPER ENDO (N/A)  Patient location during evaluation: PACU Anesthesia Type: General Level of consciousness: awake and alert Pain management: pain level controlled Vital Signs Assessment: post-procedure vital signs reviewed and stable Respiratory status: spontaneous breathing, nonlabored ventilation, respiratory function stable and patient connected to nasal cannula oxygen Cardiovascular status: blood pressure returned to baseline and stable Postop Assessment: no signs of nausea or vomiting Anesthetic complications: no       Last Vitals:  Vitals:   04/12/16 1415 04/12/16 1430  BP: 137/75 (!) 148/76  Pulse: (!) 109 97  Resp: (!) 21 11  Temp:      Last Pain:  Vitals:   04/12/16 1430  TempSrc:   PainSc: 5                  Bena Kobel S

## 2016-04-12 NOTE — Op Note (Signed)
Name:  Michelle Wilson MRN: [id number] Date of Surgery: 04/12/2016  Preop Diagnosis:  Morbid Obesity  Postop Diagnosis:  Morbid Obesity (Weight - 244, BMI - 40.7), S/P Gastric Sleeve  Procedure:  Upper endoscopy  (Intraoperative)  Surgeon:  Ovidio Kinavid Darel Ricketts, M.D.  Anesthesia:  GET  Indications for procedure: Michelle Wilson is a 40 y.o. female whose primary care physician is Milinda AntisURHAM, KAWANTA, MD and has completed a Gastric Sleeve today by Dr. Daphine DeutscherMartin.  I am doing an intraoperative upper endoscopy to evaluate the gastric pouch.  Operative Note: The patient is under general anesthesia.  Dr. Daphine DeutscherMartin is laparoscoping the patient while I do an upper endoscopy to evaluate the stomach pouch.  With the patient intubated, I passed the Olympus upper endoscope without difficulty down the esophagus.  The esophagus was unremarkable.  The esophago-gastric junction was at 39 cm.    The mucosa of the stomach looked viable and the staple line was intact without bleeding.  I advanced the scope to the pylorus, but did not go through it.  While I insufflated the stomach pouch with air, Dr. Daphine DeutscherMartin  flooded the upper abdomen with saline to put the gastric pouch under saline.  There was no bubbling or evidence of a leak.  There was no evidence of narrowing of the pouch and the gastric sleeve looked tubular.  The scope was then withdrawn.  The esophagus was unremarkable and the patient tolerated the endoscopy without difficulty.  Ovidio Kinavid Tommie Dejoseph, MD, [facility] Pager: [phone] Office phone:  [phone]

## 2016-04-12 NOTE — Discharge Instructions (Addendum)
[facility] Discharge Instructions  °On the day of surgery you were given the medication aprepitant. This medication interacts with hormonal forms of birth control (oral contraceptives and injected or implanted birth control) and may make them ineffective. °IF YOU USE ANY HORMONAL FORM OF BIRTH CONTROL, YOU MUST USE AN ADDITIONAL BARRIER BIRTH CONTROL METHOD FOR ONE MONTH after receiving aprepitant or there is a chance you could become pregnant. ° ° ° ° °GASTRIC BYPASS/SLEEVE ° Home Care Instructions ° ° These instructions are to help you care for yourself when you go home. ° °Call: If you have any problems. °• Call [phone] and ask for the surgeon on call °• If you need immediate assistance come to the ER at [facility]. Tell the ER staff you are a new post-op gastric bypass or gastric sleeve patient  °Signs and symptoms to report: • Severe  vomiting or nausea °o If you cannot handle clear liquids for longer than 1 day, call your surgeon °• Abdominal pain which does not get better after taking your pain medication °• Fever greater than 100.4°  F and chills °• Heart rate over 100 beats a minute °• Trouble breathing °• Chest pain °• Redness,  swelling, drainage, or foul odor at incision (surgical) sites °• If your incisions open or pull apart °• Swelling or pain in calf (lower leg) °• Diarrhea (Loose bowel movements that happen often), frequent watery, uncontrolled bowel movements °• Constipation, (no bowel movements for 3 days) if this happens: °o Take Milk of Magnesia, 2 tablespoons by mouth, 3 times a day for 2 days if needed °o Stop taking Milk of Magnesia once you have had a bowel movement °o Call your doctor if constipation continues °Or °o Take Miralax  (instead of Milk of Magnesia) following the label instructions °o Stop taking Miralax once you have had a bowel movement °o Call your doctor if constipation continues °• Anything you think is “abnormal for you” °  °Normal side effects after surgery: • Unable  to sleep at night or unable to concentrate °• Irritability °• Being tearful (crying) or depressed ° °These are common complaints, possibly related to your anesthesia, stress of surgery, and change in lifestyle, that usually go away a few weeks after surgery. If these feelings continue, call your medical doctor.  °Wound Care: You may have surgical glue, steri-strips, or staples over your incisions after surgery °• Surgical glue: Looks like clear film over your incisions and will wear off a little at a time °• Steri-strips: Adhesive strips of tape over your incisions. You may notice a yellowish color on skin under the steri-strips. This is used to make the steri-strips stick better. Do not pull the steri-strips off - let them fall off °• Staples: Staples may be removed before you leave the hospital °o If you go home with staples, call [facility] for an appointment with your surgeon’s nurse to have staples removed 10 days after surgery, [phone] °• Showering: You may shower two (2) days after your surgery unless your surgeon tells you differently °o Wash gently around incisions with warm soapy water, rinse well, and gently pat dry °o If you have a drain (tube from your incision), you may need someone to hold this while you shower °o No tub baths until staples are removed and incisions are healed °  °Medications: • Medications should be liquid or crushed if larger than the size of a dime °• Extended release pills (medication that releases a little bit at   a time through the  day) should not be crushed °• Depending on the size and number of medications you take, you may need to space (take a few throughout the day)/change the time you take your medications so that you do not over-fill your pouch (smaller stomach) °• Make sure you follow-up with you primary care physician to make medication changes needed during rapid weight loss and life -style changes °• If you have diabetes, follow up with your  doctor that orders your diabetes medication(s) within one week after surgery and check your blood sugar regularly ° °• Do not drive while taking narcotics (pain medications) ° °• Do not take acetaminophen (Tylenol) and Roxicet or Lortab Elixir at the same time since these pain medications contain acetaminophen °  °Diet:  °First 2 Weeks You will see the nutritionist about two (2) weeks after your surgery. The nutritionist will increase the types of foods you can eat if you are handling liquids well: °• If you have severe vomiting or nausea and cannot handle clear liquids lasting longer than 1 day call your surgeon °Protein Shake °• Drink at least 2 ounces of shake 5-6 times per day °• Each serving of protein shakes (usually 8-12 ounces) should have a minimum of: °o 15 grams of protein °o And no more than 5 grams of carbohydrate °• Goal for protein each day: °o Men = 80 grams per day °o Women = 60 grams per day °  ° • Protein powder may be added to fluids such as non-fat milk or Lactaid milk or Soy milk (limit to 35 grams added protein powder per serving) ° °Hydration °• Slowly increase the amount of water and other clear liquids as tolerated (See Acceptable Fluids) °• Slowly increase the amount of protein shake as tolerated °• Sip fluids slowly and throughout the day °• May use sugar substitutes in small amounts (no more than 6-8 packets per day; i.e. Splenda) ° °Fluid Goal °• The first goal is to drink at least 8 ounces of protein shake/drink per day (or as directed by the nutritionist); some examples of protein shakes are Syntrax Nectar, Adkins Advantage, EAS Edge HP, and Unjury. - See handout from pre-op Bariatric Education Class: °o Slowly increase the amount of protein shake you drink as tolerated °o You may find it easier to slowly sip shakes throughout the day °o It is important to get your proteins in first °• Your fluid goal is to drink 64-100 ounces of fluid daily °o It may take a few weeks to build up to  this  °• 32 oz. (or more) should be clear liquids °And °• 32 oz. (or more) should be full liquids (see below for examples) °• Liquids should not contain sugar, caffeine, or carbonation ° °Clear Liquids: °• Water of Sugar-free flavored water (i.e. Fruit H²O, Propel) °• Decaffeinated coffee or tea (sugar-free) °• Crystal lite, Wyler’s Lite, Minute Maid Lite °• Sugar-free Jell-O °• Bouillon or broth °• Sugar-free Popsicle:    - Less than 20 calories each; Limit 1 per day ° °Full Liquids: °                  Protein Shakes/Drinks + 2 choices per day of other full liquids °• Full liquids must be: °o No More Than 12 grams of Carbs per serving °o No More Than 3 grams of Fat per serving °• Strained low-fat cream soup °• Non-Fat milk °• Fat-free Lactaid Milk °• Sugar-free yogurt (Dannon Lite & Fit, Greek   yogurt) ° °  °Vitamins and Minerals • Start 1 day after surgery unless otherwise directed by your surgeon °• 2 Chewable Multivitamin / Multimineral Supplement with iron (i.e. Centrum for Adults) °• Vitamin B-12, 350-500 micrograms sub-lingual (place tablet under the tongue) each day °• Chewable Calcium Citrate with Vitamin D-3 °(Example: 3 Chewable Calcium  Plus 600 with Vitamin D-3) °o Take 500 mg three (3) times a day for a total of 1500 mg each day °o Do not take all 3 doses of calcium at one time as it may cause constipation, and you can only absorb 500 mg at a time °o Do not mix multivitamins containing iron with calcium supplements;  take 2 hours apart °o Do not substitute Tums (calcium carbonate) for your calcium °• Menstruating women and those at risk for anemia ( a blood disease that causes weakness) may need extra iron °o Talk to your doctor to see if you need more iron °• If you need extra iron: Total daily Iron recommendation (including Vitamins) is 50 to 100 mg Iron/day °• Do not stop taking or change any vitamins or minerals until you talk to your nutritionist or surgeon °• Your nutritionist and/or surgeon must  approve all vitamin and mineral supplements °  °Activity and Exercise: It is important to continue walking at home. Limit your physical activity as instructed by your doctor. During this time, use these guidelines: °• Do not lift anything greater than ten  (10) pounds for at least two (2) weeks °• Do not go back to work or drive until your surgeon says you can °• You may have sex when you feel comfortable °o It is VERY important for female patients to use a reliable birth control method; fertility often increase after surgery °o Do not get pregnant for at least 18 months °• Start exercising as soon as your doctor tells you that you can °o Make sure your doctor approves any physical activity °• Start with a simple walking program °• Walk 5-15 minutes each day, 7 days per week °• Slowly increase until you are walking 30-45 minutes per day °• Consider joining our BELT program. [phone] or email [email or web address] °  °Special Instructions Things to remember: °• Free counseling is available for you and your family through collaboration between [facility] and [employer]. Please call [phone] and leave a message °• Use your CPAP when sleeping if this applies to you °• Consider buying a medical alert bracelet that says you had lap-band surgery °  °  You will likely have your first fill (fluid added to your band) 6 - 8 weeks after surgery °• [facility] has a free Bariatric Surgery Support Group that meets monthly, the 3rd Thursday, 6pm. [facility] Classrooms. You can see classes online at [email or web address] °• It is very important to keep all follow up appointments with your surgeon, nutritionist, primary care physician, and behavioral health practitioner °o After the first year, please follow up with your bariatric surgeon and nutritionist at least once a year in order to maintain best weight loss results °      °             [facility]:  [phone] ° °             [facility]: [phone] ° °             Bariatric Nurse Coordinator: [phone]  °Gastric Bypass/Sleeve Home Care   Instructions  Rev. 05/2012    ° °                                                    Reviewed and Endorsed °                                                   by [facility] Patient Education Committee, Jan, 2014 ° ° ° ° ° ° ° ° ° °

## 2016-04-12 NOTE — Interval H&P Note (Signed)
History and Physical Interval Note:  04/12/2016 10:26 AM  Michelle Wilson  has presented today for surgery, with the diagnosis of Morbid Obesity, HTN  The various methods of treatment have been discussed with the patient and family. After consideration of risks, benefits and other options for treatment, the patient has consented to  Procedure(s): LAPAROSCOPIC GASTRIC SLEEVE RESECTION, UPPER ENDO (N/A) as a surgical intervention .  The patient's history has been reviewed, patient examined, no change in status, stable for surgery.  I have reviewed the patient's chart and labs.  Questions were answered to the patient's satisfaction.     Zianne Schubring B

## 2016-04-12 NOTE — Care Management Note (Signed)
Case Management Note  Patient Details  Name: Michelle Wilson MRN: [id number] Date of Birth: 1975-12-30  Subjective/Objective:      Gastric sleeve due to morbid obesity.              Action/Plan:  tbd   Expected Discharge Date:                  Expected Discharge Plan:  Home/Self Care  In-House Referral:     Discharge planning Services     Post Acute Care Choice:    Choice offered to:     DME Arranged:    DME Agency:     HH Arranged:    HH Agency:     Status of Service:  In process, will continue to follow  If discussed at Long Length of Stay Meetings, dates discussed:    Additional Comments:  Golda AcreDavis, Rhonda Lynn, RN 04/12/2016, 3:10 PM

## 2016-04-12 NOTE — Anesthesia Preprocedure Evaluation (Signed)
Anesthesia Evaluation  Patient identified by MRN, date of birth, ID band Patient awake    Reviewed: Allergy & Precautions, NPO status , Patient's Chart, lab work & pertinent test results  Airway Mallampati: II  TM Distance: >3 FB Neck ROM: Full    Dental no notable dental hx.    Pulmonary neg pulmonary ROS,    Pulmonary exam normal breath sounds clear to auscultation       Cardiovascular hypertension, Pt. on medications Normal cardiovascular exam Rhythm:Regular Rate:Normal     Neuro/Psych negative neurological ROS  negative psych ROS   GI/Hepatic negative GI ROS, Neg liver ROS,   Endo/Other  Morbid obesity  Renal/GU negative Renal ROS  negative genitourinary   Musculoskeletal negative musculoskeletal ROS (+)   Abdominal   Peds negative pediatric ROS (+)  Hematology negative hematology ROS (+)   Anesthesia Other Findings   Reproductive/Obstetrics negative OB ROS                             Anesthesia Physical Anesthesia Plan  ASA: II  Anesthesia Plan: General   Post-op Pain Management:    Induction: Intravenous  Airway Management Planned: Oral ETT  Additional Equipment:   Intra-op Plan:   Post-operative Plan: Extubation in OR  Informed Consent: I have reviewed the patients History and Physical, chart, labs and discussed the procedure including the risks, benefits and alternatives for the proposed anesthesia with the patient or authorized representative who has indicated his/her understanding and acceptance.   Dental advisory given  Plan Discussed with: CRNA and Surgeon  Anesthesia Plan Comments:         Anesthesia Quick Evaluation

## 2016-04-12 NOTE — Transfer of Care (Signed)
Immediate Anesthesia Transfer of Care Note  Patient: Michelle Wilson  Procedure(s) Performed: Procedure(s): LAPAROSCOPIC GASTRIC SLEEVE RESECTION, UPPER ENDO (N/A)  Patient Location: PACU  Anesthesia Type:General  Level of Consciousness: awake, alert , oriented and patient cooperative  Airway & Oxygen Therapy: Patient Spontanous Breathing and Patient connected to face mask oxygen  Post-op Assessment: Report given to RN, Post -op Vital signs reviewed and stable and Patient moving all extremities X 4  Post vital signs: stable  Last Vitals:  Vitals:   04/12/16 0759 04/12/16 1348  BP: (!) 149/83 (!) 156/79  Pulse: 100 (!) 116  Resp: 16 15  Temp: 37.2 C 36.7 C    Last Pain:  Vitals:   04/12/16 0759  TempSrc: Oral      Patients Stated Pain Goal: 4 (04/12/16 0947)  Complications: No apparent anesthesia complications

## 2016-04-12 NOTE — Anesthesia Procedure Notes (Signed)
Procedure Name: Intubation Date/Time: 04/12/2016 11:06 AM Performed by: Illene SilverEVANS, Kalecia Hartney E Pre-anesthesia Checklist: Patient identified, Emergency Drugs available, Suction available and Patient being monitored Patient Re-evaluated:Patient Re-evaluated prior to inductionOxygen Delivery Method: Circle system utilized Preoxygenation: Pre-oxygenation with 100% oxygen Intubation Type: IV induction Ventilation: Mask ventilation without difficulty Laryngoscope Size: Mac and 4 Grade View: Grade II Tube type: Oral Tube size: 7.0 mm Number of attempts: 1 Airway Equipment and Method: Stylet and Oral airway Placement Confirmation: ETT inserted through vocal cords under direct vision,  positive ETCO2 and breath sounds checked- equal and bilateral Secured at: 21 cm Tube secured with: Tape Dental Injury: Teeth and Oropharynx as per pre-operative assessment

## 2016-04-13 ENCOUNTER — Inpatient Hospital Stay (HOSPITAL_COMMUNITY): Payer: 59

## 2016-04-13 DIAGNOSIS — Z9889 Other specified postprocedural states: Secondary | ICD-10-CM

## 2016-04-13 LAB — CBC
HCT: 31.9 % — ABNORMAL LOW (ref 36.0–46.0)
HCT: 30.9 % — ABNORMAL LOW (ref 36.0–46.0)
HCT: 30.6 % — ABNORMAL LOW (ref 36.0–46.0)
Hemoglobin: 11 g/dL — ABNORMAL LOW (ref 12.0–15.0)
Hemoglobin: 10.6 g/dL — ABNORMAL LOW (ref 12.0–15.0)
Hemoglobin: 10.4 g/dL — ABNORMAL LOW (ref 12.0–15.0)
MCH: 27.4 pg (ref 26.0–34.0)
MCH: 27.7 pg (ref 26.0–34.0)
MCH: 27.7 pg (ref 26.0–34.0)
MCHC: 34.5 g/dL (ref 30.0–36.0)
MCHC: 34.3 g/dL (ref 30.0–36.0)
MCHC: 34 g/dL (ref 30.0–36.0)
MCV: 79.4 fL (ref 78.0–100.0)
MCV: 80.9 fL (ref 78.0–100.0)
MCV: 81.4 fL (ref 78.0–100.0)
PLATELETS: 262 10*3/uL (ref 150–400)
Platelets: 268 10*3/uL (ref 150–400)
Platelets: 238 10*3/uL (ref 150–400)
RBC: 4.02 MIL/uL (ref 3.87–5.11)
RBC: 3.82 MIL/uL — ABNORMAL LOW (ref 3.87–5.11)
RBC: 3.76 MIL/uL — ABNORMAL LOW (ref 3.87–5.11)
RDW: 13.3 % (ref 11.5–15.5)
RDW: 13.5 % (ref 11.5–15.5)
RDW: 13.5 % (ref 11.5–15.5)
WBC: 12.4 10*3/uL — ABNORMAL HIGH (ref 4.0–10.5)
WBC: 11.6 10*3/uL — AB (ref 4.0–10.5)
WBC: 11.4 10*3/uL — AB (ref 4.0–10.5)

## 2016-04-13 LAB — CBC WITH DIFFERENTIAL/PLATELET
Basophils Absolute: 0 10*3/uL (ref 0.0–0.1)
Basophils Relative: 0 %
EOS PCT: 0 %
Eosinophils Absolute: 0 10*3/uL (ref 0.0–0.7)
HCT: 31.9 % — ABNORMAL LOW (ref 36.0–46.0)
Hemoglobin: 10.9 g/dL — ABNORMAL LOW (ref 12.0–15.0)
LYMPHS ABS: 0.9 10*3/uL (ref 0.7–4.0)
LYMPHS PCT: 9 %
MCH: 27.7 pg (ref 26.0–34.0)
MCHC: 34.2 g/dL (ref 30.0–36.0)
MCV: 81 fL (ref 78.0–100.0)
MONO ABS: 0.8 10*3/uL (ref 0.1–1.0)
Monocytes Relative: 8 %
Neutro Abs: 8.4 10*3/uL — ABNORMAL HIGH (ref 1.7–7.7)
Neutrophils Relative %: 83 %
PLATELETS: 267 10*3/uL (ref 150–400)
RBC: 3.94 MIL/uL (ref 3.87–5.11)
RDW: 13.3 % (ref 11.5–15.5)
WBC: 10.1 10*3/uL (ref 4.0–10.5)

## 2016-04-13 MED ORDER — ALPRAZOLAM 0.5 MG PO TABS
0.5000 mg | ORAL_TABLET | Freq: Three times a day (TID) | ORAL | Status: DC | PRN
  Administered 2016-04-13 – 2016-04-14 (×2): 0.5 mg via ORAL
  Filled 2016-04-13 (×2): qty 1

## 2016-04-13 MED FILL — ALPRAZolam 0.5 MG TABS: 0.5 | 30 days supply | Qty: 60 | Fill #2

## 2016-04-13 NOTE — Plan of Care (Signed)
Problem: Food- and Nutrition-Related Knowledge Deficit (NB-1.1) Goal: Nutrition education Formal process to instruct or train a patient/client in a skill or to impart knowledge to help patients/clients voluntarily manage or modify food choices and eating behavior to maintain or improve health. Outcome: Completed/Met Date Met: 04/13/16 Nutrition Education Note  Received consult for diet education per DROP protocol.   Discussed 2 week post op diet with pt. Emphasized that liquids must be non carbonated, non caffeinated, and sugar free. Fluid goals discussed. Reviewed progression of diet to include soft proteins at 7-10 days post-op. Pt to follow up with outpatient bariatric RD for further diet progression after 2 weeks. Multivitamins and minerals also reviewed. Teach back method used, pt expressed understanding, expect good compliance.   Diet: First 2 Weeks  You will see the dietitian about two (2) weeks after your surgery. The dietitian will increase the types of foods you can eat if you are handling liquids well:  If you have severe vomiting or nausea and cannot handle clear liquids lasting longer than 1 day, call your surgeon  Protein Shake  Drink at least 2 ounces of shake 5-6 times per day  Each serving of protein shakes (usually 8 - 12 ounces) should have a minimum of:  15 grams of protein  And no more than 5 grams of carbohydrate  Goal for protein each day:  Men = 80 grams per day  Women = 60 grams per day  Protein powder may be added to fluids such as non-fat milk or Lactaid milk or Soy milk (limit to 35 grams added protein powder per serving)   Hydration  Slowly increase the amount of water and other clear liquids as tolerated (See Acceptable Fluids)  Slowly increase the amount of protein shake as tolerated  Sip fluids slowly and throughout the day  May use sugar substitutes in small amounts (no more than 6 - 8 packets per day; i.e. Splenda)   Fluid Goal  The first goal is to  drink at least 8 ounces of protein shake/drink per day (or as directed by the nutritionist); some examples of protein shakes are Syntrax Nectar, Adkins Advantage, EAS Edge HP, and Unjury. See handout from pre-op Bariatric Education Class:  Slowly increase the amount of protein shake you drink as tolerated  You may find it easier to slowly sip shakes throughout the day  It is important to get your proteins in first  Your fluid goal is to drink 64 - 100 ounces of fluid daily  It may take a few weeks to build up to this  32 oz (or more) should be clear liquids  And  32 oz (or more) should be full liquids (see below for examples)  Liquids should not contain sugar, caffeine, or carbonation   Clear Liquids:  Water or Sugar-free flavored water (i.e. Fruit H2O, Propel)  Decaffeinated coffee or tea (sugar-free)  Crystal Lite, Wyler's Lite, Minute Maid Lite  Sugar-free Jell-O  Bouillon or broth  Sugar-free Popsicle: *Less than 20 calories each; Limit 1 per day   Full Liquids:  Protein Shakes/Drinks + 2 choices per day of other full liquids  Full liquids must be:  No More Than 12 grams of Carbs per serving  No More Than 3 grams of Fat per serving  Strained low-fat cream soup  Non-Fat milk  Fat-free Lactaid Milk  Sugar-free yogurt (Dannon Lite & Fit, Greek yogurt)     Michelle Carreno, MS, RD, LDN Pager: [phone] After Hours Pager: [phone]    

## 2016-04-13 NOTE — Progress Notes (Addendum)
*  Preliminary Results* Bilateral lower extremity venous duplex completed. Bilateral lower extremities are negative for deep vein thrombosis. There is no evidence of Baker's cyst bilaterally.  Preliminary results discussed with Toniann FailWendy of Dr. Ermalene SearingMartin's office.  04/13/2016 9:56 AM Gertie FeyMichelle Ritesh Opara, BS, RVT, RDCS, RDMS

## 2016-04-13 NOTE — Progress Notes (Signed)
Patient alert and oriented, Post op day 1.  Provided support and encouragement.  Encouraged pulmonary toilet, ambulation and small sips of liquids.  All questions answered.  Will continue to monitor. 

## 2016-04-13 NOTE — Progress Notes (Signed)
Patient ID: Michelle Wilson, female   DOB: 05/21/1975, 40 y.o.   MRN: [id number] [facility] Progress Note:   1 Day Post-Op  Subjective: Mental status is clear.  Patient sitting up in chair in stepdown Objective: Vital signs in last 24 hours: Temp:  [98 F (36.7 C)-98.8 F (37.1 C)] 98.4 F (36.9 C) (12/19 0803) Pulse Rate:  [67-116] 67 (12/18 1600) Resp:  [11-32] 29 (12/19 1000) BP: (102-156)/(55-90) 135/66 (12/19 0803) SpO2:  [96 %-100 %] 100 % (12/19 1000) Weight:  [115 kg (253 lb 8.5 oz)] 115 kg (253 lb 8.5 oz) (12/18 1512)  Intake/Output from previous day: 12/18 0701 - 12/19 0700 In: 5002.1 [I.V.:5002.1] Out: 3500 [Urine:2900; Blood:600] Intake/Output this shift: Total I/O In: 680 [P.O.:180; I.V.:500] Out: 275 [Urine:275]  Physical Exam: Work of breathing is not labored;  Abdominal pain is minimal  Lab Results:  Results for orders placed or performed during the hospital encounter of 04/12/16 (from the past 48 hour(s))  Pregnancy, urine STAT morning of surgery     Status: None   Collection Time: 04/12/16  9:29 AM  Result Value Ref Range   Preg Test, Ur NEGATIVE NEGATIVE    Comment:        THE SENSITIVITY OF THIS METHODOLOGY IS >20 mIU/mL.   Type and screen [facility]     Status: None   Collection Time: 04/12/16  2:06 PM  Result Value Ref Range   ABO/RH(D) B POS    Antibody Screen NEG    Sample Expiration 04/15/2016   ABO/Rh     Status: None   Collection Time: 04/12/16  2:06 PM  Result Value Ref Range   ABO/RH(D) B POS   CBC     Status: Abnormal   Collection Time: 04/12/16  2:09 PM  Result Value Ref Range   WBC 13.7 (H) 4.0 - 10.5 K/uL   RBC 4.09 3.87 - 5.11 MIL/uL   Hemoglobin 11.3 (L) 12.0 - 15.0 g/dL   HCT 40.932.8 (L) 81.136.0 - 91.446.0 %   MCV 80.2 78.0 - 100.0 fL   MCH 27.6 26.0 - 34.0 pg   MCHC 34.5 30.0 - 36.0 g/dL   RDW 78.213.4 95.611.5 - 21.315.5 %   Platelets 302 150 - 400 K/uL  MRSA PCR Screening     Status: None   Collection  Time: 04/12/16  2:51 PM  Result Value Ref Range   MRSA by PCR NEGATIVE NEGATIVE    Comment:        The GeneXpert MRSA Assay (FDA approved for NASAL specimens only), is one component of a comprehensive MRSA colonization surveillance program. It is not intended to diagnose MRSA infection nor to guide or monitor treatment for MRSA infections.   CBC     Status: Abnormal   Collection Time: 04/12/16  7:32 PM  Result Value Ref Range   WBC 12.2 (H) 4.0 - 10.5 K/uL   RBC 4.08 3.87 - 5.11 MIL/uL   Hemoglobin 11.3 (L) 12.0 - 15.0 g/dL   HCT 08.633.0 (L) 57.836.0 - 46.946.0 %   MCV 80.9 78.0 - 100.0 fL   MCH 27.7 26.0 - 34.0 pg   MCHC 34.2 30.0 - 36.0 g/dL   RDW 62.913.3 52.811.5 - 41.315.5 %   Platelets 276 150 - 400 K/uL  CBC WITH DIFFERENTIAL     Status: Abnormal   Collection Time: 04/13/16  3:16 AM  Result Value Ref Range   WBC 10.1 4.0 - 10.5 K/uL   RBC  3.94 3.87 - 5.11 MIL/uL   Hemoglobin 10.9 (L) 12.0 - 15.0 g/dL   HCT 29.531.9 (L) 62.136.0 - 30.846.0 %   MCV 81.0 78.0 - 100.0 fL   MCH 27.7 26.0 - 34.0 pg   MCHC 34.2 30.0 - 36.0 g/dL   RDW 65.713.3 84.611.5 - 96.215.5 %   Platelets 267 150 - 400 K/uL   Neutrophils Relative % 83 %   Neutro Abs 8.4 (H) 1.7 - 7.7 K/uL   Lymphocytes Relative 9 %   Lymphs Abs 0.9 0.7 - 4.0 K/uL   Monocytes Relative 8 %   Monocytes Absolute 0.8 0.1 - 1.0 K/uL   Eosinophils Relative 0 %   Eosinophils Absolute 0.0 0.0 - 0.7 K/uL   Basophils Relative 0 %   Basophils Absolute 0.0 0.0 - 0.1 K/uL  CBC     Status: Abnormal   Collection Time: 04/13/16  8:46 AM  Result Value Ref Range   WBC 12.4 (H) 4.0 - 10.5 K/uL   RBC 4.02 3.87 - 5.11 MIL/uL   Hemoglobin 11.0 (L) 12.0 - 15.0 g/dL   HCT 95.231.9 (L) 84.136.0 - 32.446.0 %   MCV 79.4 78.0 - 100.0 fL   MCH 27.4 26.0 - 34.0 pg   MCHC 34.5 30.0 - 36.0 g/dL   RDW 40.113.3 02.711.5 - 25.315.5 %   Platelets 268 150 - 400 K/uL    Radiology/Results: No results found.  Anti-infectives: Anti-infectives    Start     Dose/Rate Route Frequency Ordered Stop   04/12/16  1000  cefoTEtan in Dextrose 5% (CEFOTAN) IVPB 2 g     2 g Intravenous On call to O.R. 04/12/16 0930 04/12/16 1108      Assessment/Plan: Problem List: Patient Active Problem List   Diagnosis Date Noted  . 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 Essential hypertension, benign 08/19/2011  . Obesity 08/19/2011  . Anxiety 08/19/2011  . Seasonal allergies 08/19/2011    Hg stable;  DVT exam negative.  Will advance liquid diet today;  Discontinue Foley and transfer to the floor 1 Day Post-Op    LOS: 1 day   Matt B. Daphine DeutscherMartin, MD, [facility]FACS  [facility] [phone] beeper [phone]  04/13/2016 12:27 PM

## 2016-04-13 NOTE — Progress Notes (Signed)
0030 RN noted pt HR SB as low as 46 bpm, non-sustained. Pt HR sustaining at rate in the 52-56 bpm, at rest. Pt received Morphine 2mg  at 2318. Per CCMD, pt HR at beginning of shift (04/12/16, 1920) NSR with BBB. Pt currently asleep, easily aroused. Will continue to monitor.

## 2016-04-14 LAB — CBC
HEMATOCRIT: 30.4 % — AB (ref 36.0–46.0)
Hemoglobin: 10.4 g/dL — ABNORMAL LOW (ref 12.0–15.0)
MCH: 27.4 pg (ref 26.0–34.0)
MCHC: 34.2 g/dL (ref 30.0–36.0)
MCV: 80.2 fL (ref 78.0–100.0)
Platelets: 253 10*3/uL (ref 150–400)
RBC: 3.79 MIL/uL — ABNORMAL LOW (ref 3.87–5.11)
RDW: 13.5 % (ref 11.5–15.5)
WBC: 10 10*3/uL (ref 4.0–10.5)

## 2016-04-14 LAB — CBC WITH DIFFERENTIAL/PLATELET
Basophils Absolute: 0 10*3/uL (ref 0.0–0.1)
Basophils Relative: 0 %
Eosinophils Absolute: 0 10*3/uL (ref 0.0–0.7)
Eosinophils Relative: 0 %
HCT: 29.8 % — ABNORMAL LOW (ref 36.0–46.0)
HEMOGLOBIN: 10.2 g/dL — AB (ref 12.0–15.0)
LYMPHS ABS: 2 10*3/uL (ref 0.7–4.0)
LYMPHS PCT: 22 %
MCH: 27.9 pg (ref 26.0–34.0)
MCHC: 34.2 g/dL (ref 30.0–36.0)
MCV: 81.6 fL (ref 78.0–100.0)
Monocytes Absolute: 0.6 10*3/uL (ref 0.1–1.0)
Monocytes Relative: 7 %
NEUTROS PCT: 71 %
Neutro Abs: 6.7 10*3/uL (ref 1.7–7.7)
Platelets: 242 10*3/uL (ref 150–400)
RBC: 3.65 MIL/uL — AB (ref 3.87–5.11)
RDW: 13.6 % (ref 11.5–15.5)
WBC: 9.3 10*3/uL (ref 4.0–10.5)

## 2016-04-14 NOTE — Consult Note (Signed)
   [facility]   04/14/2016  [name] 04/07/1976 [id number]    Came to visit Michelle Wilson at bedside on behalf of [employer] to [facility] Care Management program for [facility] employees. Denies having any needs for [facility] DM program. However, discussed that she will receive post hospital follow up call. Confirmed best number as [phone]. Provided contact information. Appreciative of visit.    Raiford NobleAtika Emmajane Altamura, MSN-Ed, RN,BSN [facility] Care Management Hospital Liaison [phone]

## 2016-04-14 NOTE — Progress Notes (Signed)
Patient alert and oriented, pain is controlled. Patient is tolerating fluids, advanced to protein shake today, patient is tolerating well.  Reviewed Gastric sleeve discharge instructions with patient and patient is able to articulate understanding.  Provided information on BELT program, Support Group and [facility] outpatient pharmacy. All questions answered, will continue to monitor.  Carmichael Burdette RN  

## 2016-04-14 NOTE — Discharge Summary (Signed)
Physician Discharge Summary  Patient ID: Michelle Wilson MRN: [id number] DOB/AGE: 174-Feb-1977 40 y.o.  Admit date: 04/12/2016 Discharge date: 04/14/2016  Admission Diagnoses:  Morbid obesity  Discharge Diagnoses:  same  Active Problems:   S/P laparoscopic sleeve gastrectomy Dec 2017   Surgery:  Laparoscopic sleeve gastrectomy  Discharged Condition: improved  Hospital Course:   Had surgery.  Monitored for postop bleeding in stepdown on first post op night.  Hg stable and asymptomatic.  Took liquids and did well.  Ready for discharge on PD2  Consults: none  Significant Diagnostic Studies: Hg 10.4    Discharge Exam: Blood pressure 132/75, pulse (!) 59, temperature 98.8 F (37.1 C), temperature source Oral, resp. rate 18, height 5\' 5"  (1.651 m), weight 115 kg (253 lb 8.5 oz), last menstrual period 04/12/2016, SpO2 100 %. Incisions OK  Disposition: 01-Home or Self Care  Discharge Instructions    AMB Referral to [facility]    Complete by:  As directed    Please assign UMR member for post discharge call. Denies needs for [employer] program. [facility]. Likely discharge home later today 04/14/16. Thanks. Raiford NobleAtika Hall, MSN-Ed, RN,BSN [facility] Care Management Hospital Liaison-[phone]   Reason for consult:  Please assign UMR member for post discharge call   Expected date of contact:  1-3 days (reserved for hospital discharges)   Ambulate hourly while awake    Complete by:  As directed    Call MD for:  difficulty breathing, headache or visual disturbances    Complete by:  As directed    Call MD for:  persistant dizziness or light-headedness    Complete by:  As directed    Call MD for:  persistant nausea and vomiting    Complete by:  As directed    Call MD for:  redness, tenderness, or signs of infection (pain, swelling, redness, odor or green/yellow discharge around incision site)    Complete by:  As directed    Call MD for:  severe uncontrolled pain     Complete by:  As directed    Call MD for:  temperature >101 F    Complete by:  As directed    Diet bariatric full liquid    Complete by:  As directed    Incentive spirometry    Complete by:  As directed    Perform hourly while awake     Allergies as of 04/14/2016   No Known Allergies     Medication List    TAKE these medications   ALPRAZolam 0.5 MG tablet Commonly known as:  XANAX Take 1 tablet (0.5 mg total) by mouth 3 (three) times daily as needed for anxiety.   cholecalciferol 1000 units tablet Commonly known as:  VITAMIN D Take 1,000 Units by mouth daily.   ferrous sulfate 325 (65 FE) MG EC tablet Take 325 mg by mouth 3 (three) times daily with meals.   losartan-hydrochlorothiazide 100-25 MG tablet Commonly known as:  HYZAAR TAKE 1 TABLET BY MOUTH ONCE DAILY Notes to patient:  Monitor Blood Pressure Daily and keep a log for primary care physician.  Monitor for symptoms of dehydration.  You may need to make changes to your medications with rapid weight loss.     PRENATAL VITAMIN PO Take 1 tablet by mouth daily.      Follow-up Information    Valarie MerinoMARTIN,Michelle Wigington B, MD. Go on 04/28/2016.   Specialty:  General Surgery Why:  at 2:45 for post-op check Contact information: [address] [place] [address] [phone]        Valarie MerinoMARTIN,Michelle Eckels B, MD Follow up.   Specialty:  General Surgery Contact information: [address] [place] [address] [phone]           Signed: Valarie MerinoMARTIN,Michelle Chesnut Wilson 04/14/2016, 1:20 PM

## 2016-04-14 NOTE — Progress Notes (Signed)
Discharge instructions given to patient.   Questions ansered

## 2016-04-14 NOTE — Progress Notes (Signed)
Patient ID: Michelle Wilson, female   DOB: 11-30-1975, 40 y.o.   MRN: [id number] [facility] Progress Note:   2 Days Post-Op  Subjective: Mental status is clear.  Transferred to 5W.  Feeling good except for slight nausea this morning.   Objective: Vital signs in last 24 hours: Temp:  [98.6 F (37 C)-99.3 F (37.4 C)] 99 F (37.2 C) (12/20 0614) Resp:  [18-29] 18 (12/20 0614) BP: (118-150)/(60-81) 118/61 (12/20 0614) SpO2:  [100 %] 100 % (12/20 0614) Weight:  [115 kg (253 lb 8.5 oz)] 115 kg (253 lb 8.5 oz) (12/19 2124)  Intake/Output from previous day: 12/19 0701 - 12/20 0700 In: 3420 [P.O.:420; I.V.:3000] Out: 2475 [Urine:2475] Intake/Output this shift: No intake/output data recorded.  Physical Exam: Work of breathing is not labored.  No complaints  Lab Results:  Results for orders placed or performed during the hospital encounter of 04/12/16 (from the past 48 hour(s))  Pregnancy, urine STAT morning of surgery     Status: None   Collection Time: 04/12/16  9:29 AM  Result Value Ref Range   Preg Test, Ur NEGATIVE NEGATIVE    Comment:        THE SENSITIVITY OF THIS METHODOLOGY IS >20 mIU/mL.   Type and screen [facility]     Status: None   Collection Time: 04/12/16  2:06 PM  Result Value Ref Range   ABO/RH(D) B POS    Antibody Screen NEG    Sample Expiration 04/15/2016   ABO/Rh     Status: None   Collection Time: 04/12/16  2:06 PM  Result Value Ref Range   ABO/RH(D) B POS   CBC     Status: Abnormal   Collection Time: 04/12/16  2:09 PM  Result Value Ref Range   WBC 13.7 (H) 4.0 - 10.5 K/uL   RBC 4.09 3.87 - 5.11 MIL/uL   Hemoglobin 11.3 (L) 12.0 - 15.0 g/dL   HCT 62.932.8 (L) 52.836.0 - 41.346.0 %   MCV 80.2 78.0 - 100.0 fL   MCH 27.6 26.0 - 34.0 pg   MCHC 34.5 30.0 - 36.0 g/dL   RDW 24.413.4 01.011.5 - 27.215.5 %   Platelets 302 150 - 400 K/uL  MRSA PCR Screening     Status: None   Collection Time: 04/12/16  2:51 PM  Result Value Ref Range   MRSA by  PCR NEGATIVE NEGATIVE    Comment:        The GeneXpert MRSA Assay (FDA approved for NASAL specimens only), is one component of a comprehensive MRSA colonization surveillance program. It is not intended to diagnose MRSA infection nor to guide or monitor treatment for MRSA infections.   CBC     Status: Abnormal   Collection Time: 04/12/16  7:32 PM  Result Value Ref Range   WBC 12.2 (H) 4.0 - 10.5 K/uL   RBC 4.08 3.87 - 5.11 MIL/uL   Hemoglobin 11.3 (L) 12.0 - 15.0 g/dL   HCT 53.633.0 (L) 64.436.0 - 03.446.0 %   MCV 80.9 78.0 - 100.0 fL   MCH 27.7 26.0 - 34.0 pg   MCHC 34.2 30.0 - 36.0 g/dL   RDW 74.213.3 59.511.5 - 63.815.5 %   Platelets 276 150 - 400 K/uL  CBC WITH DIFFERENTIAL     Status: Abnormal   Collection Time: 04/13/16  3:16 AM  Result Value Ref Range   WBC 10.1 4.0 - 10.5 K/uL   RBC 3.94 3.87 - 5.11 MIL/uL   Hemoglobin  10.9 (L) 12.0 - 15.0 g/dL   HCT 40.931.9 (L) 81.136.0 - 91.446.0 %   MCV 81.0 78.0 - 100.0 fL   MCH 27.7 26.0 - 34.0 pg   MCHC 34.2 30.0 - 36.0 g/dL   RDW 78.213.3 95.611.5 - 21.315.5 %   Platelets 267 150 - 400 K/uL   Neutrophils Relative % 83 %   Neutro Abs 8.4 (H) 1.7 - 7.7 K/uL   Lymphocytes Relative 9 %   Lymphs Abs 0.9 0.7 - 4.0 K/uL   Monocytes Relative 8 %   Monocytes Absolute 0.8 0.1 - 1.0 K/uL   Eosinophils Relative 0 %   Eosinophils Absolute 0.0 0.0 - 0.7 K/uL   Basophils Relative 0 %   Basophils Absolute 0.0 0.0 - 0.1 K/uL  CBC     Status: Abnormal   Collection Time: 04/13/16  8:46 AM  Result Value Ref Range   WBC 12.4 (H) 4.0 - 10.5 K/uL   RBC 4.02 3.87 - 5.11 MIL/uL   Hemoglobin 11.0 (L) 12.0 - 15.0 g/dL   HCT 08.631.9 (L) 57.836.0 - 46.946.0 %   MCV 79.4 78.0 - 100.0 fL   MCH 27.4 26.0 - 34.0 pg   MCHC 34.5 30.0 - 36.0 g/dL   RDW 62.913.3 52.811.5 - 41.315.5 %   Platelets 268 150 - 400 K/uL  CBC     Status: Abnormal   Collection Time: 04/13/16  2:14 PM  Result Value Ref Range   WBC 11.6 (H) 4.0 - 10.5 K/uL   RBC 3.82 (L) 3.87 - 5.11 MIL/uL   Hemoglobin 10.6 (L) 12.0 - 15.0 g/dL   HCT  24.430.9 (L) 01.036.0 - 46.0 %   MCV 80.9 78.0 - 100.0 fL   MCH 27.7 26.0 - 34.0 pg   MCHC 34.3 30.0 - 36.0 g/dL   RDW 27.213.5 53.611.5 - 64.415.5 %   Platelets 262 150 - 400 K/uL  CBC     Status: Abnormal   Collection Time: 04/13/16  7:04 PM  Result Value Ref Range   WBC 11.4 (H) 4.0 - 10.5 K/uL   RBC 3.76 (L) 3.87 - 5.11 MIL/uL   Hemoglobin 10.4 (L) 12.0 - 15.0 g/dL   HCT 03.430.6 (L) 74.236.0 - 59.546.0 %   MCV 81.4 78.0 - 100.0 fL   MCH 27.7 26.0 - 34.0 pg   MCHC 34.0 30.0 - 36.0 g/dL   RDW 63.813.5 75.611.5 - 43.315.5 %   Platelets 238 150 - 400 K/uL  CBC with Differential     Status: Abnormal   Collection Time: 04/14/16  2:00 AM  Result Value Ref Range   WBC 9.3 4.0 - 10.5 K/uL   RBC 3.65 (L) 3.87 - 5.11 MIL/uL   Hemoglobin 10.2 (L) 12.0 - 15.0 g/dL   HCT 29.529.8 (L) 18.836.0 - 41.646.0 %   MCV 81.6 78.0 - 100.0 fL   MCH 27.9 26.0 - 34.0 pg   MCHC 34.2 30.0 - 36.0 g/dL   RDW 60.613.6 30.111.5 - 60.115.5 %   Platelets 242 150 - 400 K/uL   Neutrophils Relative % 71 %   Neutro Abs 6.7 1.7 - 7.7 K/uL   Lymphocytes Relative 22 %   Lymphs Abs 2.0 0.7 - 4.0 K/uL   Monocytes Relative 7 %   Monocytes Absolute 0.6 0.1 - 1.0 K/uL   Eosinophils Relative 0 %   Eosinophils Absolute 0.0 0.0 - 0.7 K/uL   Basophils Relative 0 %   Basophils Absolute 0.0 0.0 - 0.1 K/uL  Radiology/Results: No results found.  Anti-infectives: Anti-infectives    Start     Dose/Rate Route Frequency Ordered Stop   04/12/16 1000  cefoTEtan in Dextrose 5% (CEFOTAN) IVPB 2 g     2 g Intravenous On call to O.R. 04/12/16 0930 04/12/16 1108      Assessment/Plan: Problem List: Patient Active Problem List   Diagnosis Date Noted  . 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 Essential hypertension, benign 08/19/2011  . Obesity 08/19/2011  . Anxiety 08/19/2011  . Seasonal allergies 08/19/2011    Hg stable.  Will decrease IV fluids and hopeful discharge later today.  2 Days Post-Op    LOS: 2 days   Matt B. Daphine Deutscher, MD, [facility] [phone] beeper [phone]  04/14/2016 8:18 AM

## 2016-04-15 ENCOUNTER — Encounter: Payer: Self-pay | Admitting: *Deleted

## 2016-04-15 ENCOUNTER — Other Ambulatory Visit: Payer: Self-pay | Admitting: *Deleted

## 2016-04-15 NOTE — Patient Outreach (Signed)
[facility] Care Management  04/15/2016  Michelle Wilson May 17, 1975 [id number]   Subjective: Telephone call to patient's home / mobile number, spoke with patient, and HIPAA verified.   Discussed [facility]MR Transition of care follow up and patient in agreement to complete follow up. Patient states she is doing fine, trying to get adjust to the diet, has a nutritionist follow up appointment on 04/27/16, and surgeon follow up appointment on 04/28/16.  RNCM educated patient on the importance of primary MD hospital follow up appointment, voices understanding, and states she will call to schedule appointment.  [facility].  Family medical leave act Engineer, maintenance (IT)(FMLA) has been approved through [employer] and patient does not have supplemental insurance (hospital indemnity).   Patient states she does not have any transition of care, care coordination, disease management, disease monitoring, transportation, community resource, or pharmacy needs at this time.   States she is appreciative of follow up care and in agreement to receive [facility] Care [facility] information.   Objective: Per chart review: Patient hospitalized  04/12/16 - 04/14/16 for morbid obesity.   Status post Laparoscopic sleeve gastrectomy and upper endoscopy on 04/12/16.    Assessment:Received UMR Transition of care referral on 04/14/16.   Transition of care follow up completed, no care management needs, and will proceed with case closure.   Plan: RNCM send patient successful outreach letter, [facility] pamphlet, and magnet. RNCM will send case closure due to follow up completed / no care management needs request to Michelle AlaminLaura Wilson at [facility] Care [facility].  Michelle Handa H. Gardiner Barefootooper RN, BSN, CCM [facility] Care Management [facility] Telephonic CM Phone: [phone] Fax: [phone]

## 2016-04-27 ENCOUNTER — Encounter: Payer: 59 | Attending: Surgery | Admitting: Dietician

## 2016-04-27 ENCOUNTER — Telehealth (HOSPITAL_COMMUNITY): Payer: Self-pay

## 2016-04-27 DIAGNOSIS — Z713 Dietary counseling and surveillance: Secondary | ICD-10-CM | POA: Insufficient documentation

## 2016-04-28 NOTE — Progress Notes (Signed)
Bariatric Class:  Appt start time: 1530 end time:  1630.  2 Week Post-Operative Nutrition Class  Patient was seen on 04/27/2016 for Post-Operative [facility]   Surgery date: 04/12/2016 Surgery type: sleeve gastrectomy Start weight at [facility]: 248 lbs on 03/01/2016, 250.6 lbs on 03/24/16 Weight today: 231.2 lbs  Weight change: 19.4 lbs  TANITA  BODY COMP RESULTS  03/24/16 04/27/16   BMI (kg/m^2) 41.7 38.5   Fat Mass (lbs) 132.8 123.2   Fat Free Mass (lbs) 117.8 108.0   Total Body Water (lbs) 87 79.2   The following the learning objectives were met by the patient during this course:  Identifies Phase 3A (Soft, High Proteins) Dietary Goals and will begin from 2 weeks post-operatively to 2 months post-operatively  Identifies appropriate sources of fluids and proteins   States protein recommendations and appropriate sources post-operatively  Identifies the need for appropriate texture modifications, mastication, and bite sizes when consuming solids  Identifies appropriate multivitamin and calcium sources post-operatively  Describes the need for physical activity post-operatively and will follow MD recommendations  States when to call healthcare provider regarding medication questions or post-operative complications  Handouts given during class include:  Phase 3A: Soft, High Protein Diet Handout  Follow-Up Plan: Patient will follow-up at [facility] in 6 weeks for 2 month post-op nutrition visit for diet advancement per MD.

## 2016-04-28 NOTE — Telephone Encounter (Signed)
Made discharge phone call to patient asking the following questions.    1. Do you have someone to care for you now that you are home? Yes  2. Are you having pain now that is not relieved by your pain medication?  No  3. Are you able to drink the recommended daily amount of fluids (48 ounces minimum/day) and protein (60-80 grams/day) as prescribed by the dietitian or nutritional counselor?  Yes 4. Are you taking the vitamins and minerals as prescribed?  Yes 5. Do you have the "on call" number to contact your surgeon if you have a problem or question?  Yes 6. Are your incisions free of redness, swelling or drainage? (If steri strips, address that these can fall off, shower as tolerated) Yes 7. Have your bowels moved since your surgery?  If not, are you passing gas?  Yes, Pt stated that she has experienced some constipation. Re-educated pt on OTC remedies for constipation.  8. Are you up and walking 3-4 times per day?  Yes 9. Were you provided your discharge medications before your surgery or before you were discharged from the hospital and are you taking them without problem?  Yes  Anthonella Klausner RN

## 2016-05-03 ENCOUNTER — Encounter: Payer: Self-pay | Admitting: Family Medicine

## 2016-05-03 ENCOUNTER — Ambulatory Visit: Payer: 59 | Admitting: Family Medicine

## 2016-05-07 ENCOUNTER — Ambulatory Visit (INDEPENDENT_AMBULATORY_CARE_PROVIDER_SITE_OTHER): Payer: 59 | Admitting: Family Medicine

## 2016-05-07 ENCOUNTER — Encounter: Payer: Self-pay | Admitting: Family Medicine

## 2016-05-07 VITALS — BP 118/62 | HR 96 | Temp 97.5°F | Resp 14 | Ht 65.0 in | Wt 230.0 lb

## 2016-05-07 DIAGNOSIS — F419 Anxiety disorder, unspecified: Secondary | ICD-10-CM

## 2016-05-07 DIAGNOSIS — D509 Iron deficiency anemia, unspecified: Secondary | ICD-10-CM

## 2016-05-07 DIAGNOSIS — I10 Essential (primary) hypertension: Secondary | ICD-10-CM | POA: Diagnosis not present

## 2016-05-07 DIAGNOSIS — Z9884 Bariatric surgery status: Secondary | ICD-10-CM | POA: Diagnosis not present

## 2016-05-07 DIAGNOSIS — D649 Anemia, unspecified: Secondary | ICD-10-CM | POA: Insufficient documentation

## 2016-05-07 LAB — CBC
HEMATOCRIT: 35.5 % (ref 35.0–45.0)
Hemoglobin: 11.9 g/dL — ABNORMAL LOW (ref 12.0–15.0)
MCH: 28.3 pg (ref 27.0–33.0)
MCHC: 33.5 g/dL (ref 32.0–36.0)
MCV: 84.5 fL (ref 80.0–100.0)
PLATELETS: 299 10*3/uL (ref 140–400)
RBC: 4.2 MIL/uL (ref 3.80–5.10)
RDW: 14.3 % (ref 11.0–15.0)
WBC: 9 10*3/uL (ref 3.8–10.8)

## 2016-05-07 MED ORDER — HYDROCHLOROTHIAZIDE 12.5 MG PO TABS: 12.5000 mg | ORAL_TABLET | Freq: Every day | ORAL | 3 refills | Status: DC

## 2016-05-07 MED ORDER — ALPRAZOLAM 0.5 MG PO TABS: 0.5000 mg | ORAL_TABLET | Freq: Three times a day (TID) | ORAL | 3 refills | Status: DC | PRN

## 2016-05-07 MED FILL — HYDROCHLOROTHIAZIDE 12.5 MG: 12.5 | 30 days supply | Qty: 30 | Fill #0

## 2016-05-07 NOTE — Progress Notes (Signed)
   Subjective:    Patient ID: Michelle Wilson, female    DOB: 08/26/1975, 41 y.o.   MRN: [id number]  Patient presents for F/U (S/P gastric sleeve- is not fasting)  Patient here to follow-up hypertension. Since her last visit she has had her gastric sleeve performed she is noted that her blood pressure started running low therefore I told her to take a half a tablet of her losartan HCTZ her dose is currently 50/12.5 mg once a day. He is taking her vitamins as prescribed by her weight loss physician. Home readings < 120/ 70-80 She has lost 25pounds since her surgery.  Note states that she did have mild splenic bleed her hemoglobin was 10.4 discharge she did not have any significant blood loss that she has known history of anemia. She did not require any blood transfusion. She is very anxious about everything she is worried that she may have further complications down the road which are tried to help calm her down. At this time she is doing quite well she is eating she is not having any difficulty with abdominal pain or her bowels.  He does request a refill on her Xanax  Review Of Systems:  GEN- denies fatigue, fever, weight loss,weakness, recent illness HEENT- denies eye drainage, change in vision, nasal discharge, CVS- denies chest pain, palpitations RESP- denies SOB, cough, wheeze ABD- denies N/V, change in stools, abd pain GU- denies dysuria, hematuria, dribbling, incontinence MSK- denies joint pain, muscle aches, injury Neuro- denies headache, dizziness, syncope, seizure activity       Objective:    BP 118/62 (BP Location: Left Arm, Patient Position: Sitting, Cuff Size: Large)   Pulse 96   Temp 97.5 F (36.4 C) (Oral)   Resp 14   Ht 5\' 5"  (1.651 m)   Wt 230 lb (104.3 kg)   LMP 05/05/2016 Comment: regular  SpO2 98%   BMI 38.27 kg/m  GEN- NAD, alert and oriented x3 HEENT- PERRL, EOMI, non injected sclera, pink conjunctiva, MMM, oropharynx clear Neck- Supple,  CVS- mild  tachycardia, no murmur RESP-CTAB Psych- a little anxious appearing, not depressed ,  EXT- No edema Pulses- Radial, DP- 2+        Assessment & Plan:      Problem List Items Addressed This Visit    S/P laparoscopic sleeve gastrectomy Dec 2017   Essential hypertension, benign - Primary    Her blood pressure has been decreasing that she has had the weight loss. Continue to decrease her blood pressure medication taker down Hydrocort thiazide 12.5 mg once a day.  Has had some increased anxiety since the surgery she has known underlying anxiety. Her heart rate was initially quite elevated but once we talked and things calm down she was down into the 90s resting. She's not had any palpitations or chest pain. I did refill her Apresoline and try to give her some reassurance in that she is being monitored and her postoperative state.      Relevant Medications   hydrochlorothiazide (HYDRODIURIL) 12.5 MG tablet   Anxiety   Relevant Medications   ALPRAZolam (XANAX) 0.5 MG tablet   Anemia   Relevant Orders   CBC (Completed)      Note: This dictation was prepared with Dragon dictation along with smaller phrase technology. Any transcriptional errors that result from this process are unintentional.

## 2016-05-07 NOTE — Assessment & Plan Note (Signed)
Her blood pressure has been decreasing that she has had the weight loss. Continue to decrease her blood pressure medication taker down Hydrocort thiazide 12.5 mg once a day.  Has had some increased anxiety since the surgery she has known underlying anxiety. Her heart rate was initially quite elevated but once we talked and things calm down she was down into the 90s resting. She's not had any palpitations or chest pain. I did refill her Apresoline and try to give her some reassurance in that she is being monitored and her postoperative state.

## 2016-05-07 NOTE — Patient Instructions (Signed)
Stop the losartan  Start HCTZ 12.5mg  once a day  F/U 2 months

## 2016-05-14 MED FILL — PANTOPRAZOLE SOD DR 40 MG T: 40 | 30 days supply | Qty: 30 | Fill #0

## 2016-05-25 ENCOUNTER — Encounter: Payer: 59 | Admitting: Dietician

## 2016-05-25 ENCOUNTER — Encounter: Payer: Self-pay | Admitting: Dietician

## 2016-05-25 DIAGNOSIS — IMO0001 Reserved for inherently not codable concepts without codable children: Secondary | ICD-10-CM

## 2016-05-25 DIAGNOSIS — Z713 Dietary counseling and surveillance: Secondary | ICD-10-CM | POA: Diagnosis not present

## 2016-05-25 NOTE — Progress Notes (Signed)
  Follow-up visit:  7 Weeks Post-Operative Sleeve gastrectomy Surgery  Medical Nutrition Therapy:  Appt start time: 310 end time:  340  Primary concerns today: Post-operative Bariatric Surgery Nutrition Management. Michelle Wilson returns having lost 26 pounds. She is happy with her weight loss and states she does not want to lose weight too fast. Miralax in hot tea helping constipation. Has tried greens and baked sweet potato. Notices that she is hungry every couple of hours. Meeting fluid and protein needs consistently. Aversion to eggs.   Excited to be able to wear her wedding rings!  Surgery date: 04/12/2016 Surgery type: [facility] lbs on 03/01/2016, 250.6 lbs on 03/24/16 Weight today: 224.8 lbs Weight change: 7 lbs Total weight lost: 26 lbs Goal weight: 180 lbs  TANITA  BODY COMP RESULTS  03/24/16 04/27/16 05/25/16   BMI (kg/m^2) 41.7 38.5 37.4   Fat Mass (lbs) 132.8 123.2 108.8   Fat Free Mass (lbs) 117.8 108.0 116   Total Body Water (lbs) 87 79.2 84.6    Preferred Learning Style:   No preference indicated   Learning Readiness:   Ready  24-hr recall: B (AM): Oik[employer] Triple Zero (15g) Snk (AM): cheese stick (6g)  L (PM): 4oz grilled chicken or beef (28g) Snk (PM):   D (PM): grilled chicken or salmon (28g) Snk (PM): sugar free fudgesicles  Fluid intake: 56 oz water with sugar free flavoring, hot tea, 34 oz plain water Estimated total protein intake: 75-80 g/day  Medications: off blood pressure medication Supplementation: taking  Drinking while eating: no Hair loss: no Carbonated beverages: no N/V/D/C: constipation; no vomiting Dumping syndrome: no  Recent physical activity:  Walking for an hour a few times a week  Progress Towards Goal(s):  In progress.  Handouts given during visit include:  Phase 3B lean protein + nonstarchy vegetables  Bariatric snack ideas   Nutritional Diagnosis:  -3.3 Overweight/obesity related to past  poor dietary habits and physical inactivity as evidenced by patient w/ recent sleeve gastrectomy surgery following dietary guidelines for continued weight loss.     Intervention:  Nutrition counseling provided.  Teaching Method Utilized:  Visual Auditory Hands on  Barriers to learning/adherence to lifestyle change: none  Demonstrated degree of understanding via:  Teach Back   Monitoring/Evaluation:  Dietary intake, exercise, and body weight. Follow up prn.

## 2016-05-25 NOTE — Patient Instructions (Addendum)
Goals:  Follow Phase 3B: High Protein + Non-Starchy Vegetables  Eat 3-6 small meals/snacks, every 3-5 hrs  Increase lean protein foods to meet 60g goal  Increase fluid intake to 64oz +  Avoid drinking 15 minutes before, during and 30 minutes after eating  Aim for >30 min of physical activity daily   Surgery date: 04/12/2016 Surgery type: sleeve gastrectomy [facility] 248 lbs on 03/01/2016, 250.6 lbs on 03/24/16 Weight today: 224.8 lbs Weight change: 7 lbs Total weight lost: 26 lbs Goal weight: 180 lbs  TANITA  BODY COMP RESULTS  03/24/16 04/27/16 05/25/16   BMI (kg/m^2) 41.7 38.5 37.4   Fat Mass (lbs) 132.8 123.2 108.8   Fat Free Mass (lbs) 117.8 108.0 116   Total Body Water (lbs) 87 79.2 84.6

## 2016-06-03 MED FILL — HYDROCHLOROTHIAZIDE 12.5 MG: 12.5 | 30 days supply | Qty: 30 | Fill #1

## 2016-06-03 MED FILL — ALPRAZolam 0.5 MG TABS: 0.5 | 30 days supply | Qty: 60 | Fill #3

## 2016-06-07 ENCOUNTER — Ambulatory Visit: Payer: 59 | Admitting: Dietician

## 2016-06-16 MED FILL — PANTOPRAZOLE SOD DR 40 MG T: 40 | 90 days supply | Qty: 90 | Fill #1

## 2016-06-21 MED FILL — AMOXICILLIN 500 MG CAPSULE: 500 | 10 days supply | Qty: 30 | Fill #0

## 2016-07-02 ENCOUNTER — Ambulatory Visit (INDEPENDENT_AMBULATORY_CARE_PROVIDER_SITE_OTHER): Payer: 59 | Admitting: Family Medicine

## 2016-07-02 ENCOUNTER — Encounter: Payer: Self-pay | Admitting: Family Medicine

## 2016-07-02 ENCOUNTER — Ambulatory Visit: Payer: 59 | Admitting: Family Medicine

## 2016-07-02 VITALS — BP 126/82 | HR 96 | Temp 98.3°F | Resp 16 | Ht 65.0 in | Wt 215.4 lb

## 2016-07-02 DIAGNOSIS — IMO0001 Reserved for inherently not codable concepts without codable children: Secondary | ICD-10-CM

## 2016-07-02 DIAGNOSIS — I10 Essential (primary) hypertension: Secondary | ICD-10-CM | POA: Diagnosis not present

## 2016-07-02 DIAGNOSIS — F419 Anxiety disorder, unspecified: Secondary | ICD-10-CM

## 2016-07-02 DIAGNOSIS — Z6841 Body Mass Index (BMI) 40.0 and over, adult: Secondary | ICD-10-CM

## 2016-07-02 DIAGNOSIS — D509 Iron deficiency anemia, unspecified: Secondary | ICD-10-CM

## 2016-07-02 DIAGNOSIS — E669 Obesity, unspecified: Secondary | ICD-10-CM

## 2016-07-02 LAB — COMPREHENSIVE METABOLIC PANEL
ALBUMIN: 3.8 g/dL (ref 3.6–5.1)
ALK PHOS: 89 U/L (ref 33–115)
ALT: 60 U/L — AB (ref 6–29)
AST: 37 U/L — ABNORMAL HIGH (ref 10–30)
BUN: 9 mg/dL (ref 7–25)
CALCIUM: 9.1 mg/dL (ref 8.6–10.2)
CO2: 27 mmol/L (ref 20–31)
Chloride: 103 mmol/L (ref 98–110)
Creat: 0.83 mg/dL (ref 0.50–1.10)
Glucose, Bld: 78 mg/dL (ref 70–99)
POTASSIUM: 3.6 mmol/L (ref 3.5–5.3)
Sodium: 139 mmol/L (ref 135–146)
TOTAL PROTEIN: 6.9 g/dL (ref 6.1–8.1)
Total Bilirubin: 0.5 mg/dL (ref 0.2–1.2)

## 2016-07-02 LAB — CBC WITH DIFFERENTIAL/PLATELET
BASOS PCT: 0 %
Basophils Absolute: 0 cells/uL (ref 0–200)
EOS ABS: 220 {cells}/uL (ref 15–500)
Eosinophils Relative: 4 %
HEMATOCRIT: 36.8 % (ref 35.0–45.0)
Hemoglobin: 11.6 g/dL — ABNORMAL LOW (ref 12.0–15.0)
Lymphocytes Relative: 30 %
Lymphs Abs: 1650 cells/uL (ref 850–3900)
MCH: 27 pg (ref 27.0–33.0)
MCHC: 31.5 g/dL — ABNORMAL LOW (ref 32.0–36.0)
MCV: 85.6 fL (ref 80.0–100.0)
MONO ABS: 330 {cells}/uL (ref 200–950)
MPV: 9.3 fL (ref 7.5–12.5)
Monocytes Relative: 6 %
NEUTROS ABS: 3300 {cells}/uL (ref 1500–7800)
Neutrophils Relative %: 60 %
Platelets: 280 10*3/uL (ref 140–400)
RBC: 4.3 MIL/uL (ref 3.80–5.10)
RDW: 15.1 % — ABNORMAL HIGH (ref 11.0–15.0)
WBC: 5.5 10*3/uL (ref 3.8–10.8)

## 2016-07-02 MED ORDER — ALPRAZOLAM 0.5 MG PO TABS: 0.5000 mg | ORAL_TABLET | Freq: Three times a day (TID) | ORAL | 3 refills | Status: DC | PRN

## 2016-07-02 MED ORDER — HYDROCHLOROTHIAZIDE 12.5 MG PO TABS: 12.5000 mg | ORAL_TABLET | Freq: Every day | ORAL | 3 refills | Status: DC

## 2016-07-02 MED FILL — HYDROCHLOROTHIAZIDE 12.5 MG: 12.5 | 90 days supply | Qty: 90 | Fill #0

## 2016-07-02 MED FILL — ALPRAZolam 0.5 MG TABS: 0.5 | 20 days supply | Qty: 60 | Fill #0

## 2016-07-02 NOTE — Assessment & Plan Note (Signed)
Continue multivitamins recheck her hemoglobin which has been doing well since her surgery.

## 2016-07-02 NOTE — Patient Instructions (Signed)
F/U 4 months  

## 2016-07-02 NOTE — Assessment & Plan Note (Signed)
She is controlled with hydrochlorothiazide and think that she will be able to come off of her medication as her weight continues to decrease we'll check her metabolic panel and her CBC today

## 2016-07-02 NOTE — Assessment & Plan Note (Signed)
She has been utilizing the alprazolam 3 times a day will increase her to 90 tablets. She's had some hormonal fluctuations and changes with stressors with her weight loss which was actually discussed by her bariatric surgeon that this is very normal on the first 6 months will not start any other medications at this time.

## 2016-07-02 NOTE — Progress Notes (Signed)
   Subjective:    Patient ID: Michelle Wilson, female    DOB: [birth year], 41 y.o.   MRN: [id number]  Patient presents for Follow-up and Hypertension  History of follow-up hypertension since her bariatric weight loss surgery. At the last visit we stopped her losartan and started hydrochlorothiazide 12.5 mg once a day. Her blood pressures at home are typically are around 120s over 70s to 80s. She denies any side effects of the medication.  Anxiety she continues to have some stressors and some anxious moments. She noticed that her hormone she seemed to be off which her surgeon told her is normal after bariatric surgery and with the weight loss. At times she does use up to 3 Xanax in a day. She denies any feelings of depression.     Review Of Systems:  GEN- denies fatigue, fever, weight loss,weakness, recent illness HEENT- denies eye drainage, change in vision, nasal discharge, CVS- denies chest pain, palpitations RESP- denies SOB, cough, wheeze ABD- denies N/V, change in stools, abd pain GU- denies dysuria, hematuria, dribbling, incontinence MSK- denies joint pain, muscle aches, injury Neuro- denies headache, dizziness, syncope, seizure activity       Objective:    BP 126/82   Pulse 96   Temp 98.3 F (36.8 C) (Oral)   Resp 16   Ht 5\' 5"  (1.651 m)   Wt 215 lb 6.4 oz (97.7 kg)   BMI 35.84 kg/m  GEN- NAD, alert and oriented x3 HEENT- PERRL, EOMI, non injected sclera, pink conjunctiva, MMM, oropharynx clear Neck- Supple, no thyromegaly CVS- RRR, no murmur RESP-CTAB Psych- normal affect and mood  EXT- No edema Pulses- Radial,- 2+        Assessment & Plan:      Problem List Items Addressed This Visit    Obesity - Primary   Essential hypertension, benign    She is controlled with hydrochlorothiazide and think that she will be able to come off of her medication as her weight continues to decrease we'll check her metabolic panel and her CBC today      Relevant  Medications   hydrochlorothiazide (HYDRODIURIL) 12.5 MG tablet   Other Relevant Orders   CBC with Differential/Platelet   Comprehensive metabolic panel   Anxiety    She has been utilizing the alprazolam 3 times a day will increase her to 90 tablets. She's had some hormonal fluctuations and changes with stressors with her weight loss which was actually discussed by her bariatric surgeon that this is very normal on the first 6 months will not start any other medications at this time.      Relevant Medications   ALPRAZolam (XANAX) 0.5 MG tablet   Anemia    Continue multivitamins recheck her hemoglobin which has been doing well since her surgery.         Note: This dictation was prepared with Dragon dictation along with smaller phrase technology. Any transcriptional errors that result from this process are unintentional.

## 2016-07-05 ENCOUNTER — Encounter: Payer: Self-pay | Admitting: Family Medicine

## 2016-07-07 ENCOUNTER — Other Ambulatory Visit: Payer: Self-pay | Admitting: *Deleted

## 2016-07-07 DIAGNOSIS — R945 Abnormal results of liver function studies: Principal | ICD-10-CM

## 2016-07-07 DIAGNOSIS — R7989 Other specified abnormal findings of blood chemistry: Secondary | ICD-10-CM

## 2016-07-08 ENCOUNTER — Telehealth: Payer: Self-pay | Admitting: Pediatrics

## 2016-07-08 NOTE — Telephone Encounter (Signed)
Please call patient back. Returned your call.

## 2016-07-08 NOTE — Telephone Encounter (Signed)
Will pay on March 22nd

## 2016-08-05 MED FILL — ALPRAZolam 0.5 MG TABS: 0.5 | 30 days supply | Qty: 90 | Fill #0

## 2016-08-31 ENCOUNTER — Encounter: Payer: Self-pay | Admitting: Family Medicine

## 2016-08-31 ENCOUNTER — Ambulatory Visit (INDEPENDENT_AMBULATORY_CARE_PROVIDER_SITE_OTHER): Payer: 59 | Admitting: Family Medicine

## 2016-08-31 VITALS — BP 142/72 | HR 102 | Temp 98.4°F | Resp 14 | Ht 65.0 in | Wt 209.0 lb

## 2016-08-31 DIAGNOSIS — L239 Allergic contact dermatitis, unspecified cause: Secondary | ICD-10-CM

## 2016-08-31 DIAGNOSIS — R945 Abnormal results of liver function studies: Principal | ICD-10-CM

## 2016-08-31 DIAGNOSIS — R7989 Other specified abnormal findings of blood chemistry: Secondary | ICD-10-CM | POA: Diagnosis not present

## 2016-08-31 LAB — COMPREHENSIVE METABOLIC PANEL
ALT: 23 U/L (ref 6–29)
AST: 23 U/L (ref 10–30)
Albumin: 3.8 g/dL (ref 3.6–5.1)
Alkaline Phosphatase: 78 U/L (ref 33–115)
BUN: 9 mg/dL (ref 7–25)
CHLORIDE: 104 mmol/L (ref 98–110)
CO2: 30 mmol/L (ref 20–31)
Calcium: 9.2 mg/dL (ref 8.6–10.2)
Creat: 0.83 mg/dL (ref 0.50–1.10)
Glucose, Bld: 85 mg/dL (ref 70–99)
POTASSIUM: 3.8 mmol/L (ref 3.5–5.3)
SODIUM: 142 mmol/L (ref 135–146)
Total Bilirubin: 0.4 mg/dL (ref 0.2–1.2)
Total Protein: 7.1 g/dL (ref 6.1–8.1)

## 2016-08-31 MED ORDER — PREDNISONE 10 MG PO TABS: ORAL_TABLET | ORAL | 0 refills | Status: DC

## 2016-08-31 MED ORDER — METHYLPREDNISOLONE ACETATE 40 MG/ML IJ SUSP
40.0000 mg | Freq: Once | INTRAMUSCULAR | Status: AC
  Administered 2016-08-31: 40 mg via INTRAMUSCULAR

## 2016-08-31 MED FILL — predniSONE 10 MG TABS: 10 | 6 days supply | Qty: 14 | Fill #0

## 2016-08-31 NOTE — Patient Instructions (Addendum)
Take steroid pills starting tomorrow Okay to use benadryl cream  We will call with labs F/U pending results

## 2016-08-31 NOTE — Progress Notes (Signed)
Subjective:    Patient ID: Michelle Wilson, female    DOB: 27-Nov-1975, 41 y.o.   MRN: [id number]  Patient presents for Rash (x1 week- irritation to L side of abd and scratched itchy areas, rash has spread to under breasts)   She here with purulent rash that spread to her breast area. Last week she states that she ate some pizza she has not had pizza at least 8 months but is never had any allergy before. Directly after she started noticing itching on her left upper abdomen. The next morning she actually had some redness which she treated with Benadryl cream. A few days later she began having itching on both breast which then became red and slightly raised. She is topical cortisone on her abdomen area as well as this area the abdomen is now clear but she still has redness and itching on the breast area. She became concerned possible cellulitis therefore she took 4 days of amoxicillin 500 mg once a day. She does not recall anything new on her skin she has not changed her soap or detergent. There was some poison ivy in her backyard but she does not ever coming into contact with it unless it was on her husbands clothing. She did not have any swelling of her lips or mouth and difficulty breathing during this time.  He also had elevated liver function tests or labs 2 months ago. She is due for repeat  She is very anxious about the rash and getting her labs drawn  Bp has been , 120/80 at home    Review Of Systems:  GEN- denies fatigue, fever, weight loss,weakness, recent illness HEENT- denies eye drainage, change in vision, nasal discharge, CVS- denies chest pain, palpitations RESP- denies SOB, cough, wheeze ABD- denies N/V, change in stools, abd pain GU- denies dysuria, hematuria, dribbling, incontinence MSK- denies joint pain, muscle aches, injury Neuro- denies headache, dizziness, syncope, seizure activity       Objective:    BP (!) 142/72   Pulse (!) 102   Temp 98.4 F (36.9 C)  (Oral)   Resp 14   Ht 5\' 5"  (1.651 m)   Wt 209 lb (94.8 kg)   SpO2 99%   BMI 34.78 kg/m  GEN- NAD, alert and oriented x3 HEENT- PERRL, EOMI, non injected sclera, pink conjunctiva, MMM, oropharynx clear Neck- Supple, no LAD  CVS- RRR, no murmur RESP-CTAB ABD-NABS,soft,NT,ND Skin-  Hyperpigmented scarring/excoriations on left upper abd, no papules or rash noted, bilat breast inner portion of breast to medial aspect of nipple, slightly raised erythematous patches, +excoriations , +blanching         Assessment & Plan:      Problem List Items Addressed This Visit    None    Visit Diagnoses    Elevated LFTs    -  Primary   Recheck LFT, can be transient after bariatric surgery, she is asymptomatic otherwise, this was noted before rash   Relevant Orders   Comprehensive metabolic panel   Allergic contact dermatitis, unspecified trigger       concern for possible contact dermaitis, possible exposure to poison ivy, doubt foot ingestion based on position of rash. No sign of cellulitis D/C amoxicillin Treat with depo Medrol, prednisone taper, okay to use benadryl cream    Relevant Medications   methylPREDNISolone acetate (DEPO-MEDROL) injection 40 mg (Completed)      Note: This dictation was prepared with Dragon dictation along with smaller phrase technology. Any transcriptional errors that  result from this process are unintentional.

## 2016-09-06 ENCOUNTER — Other Ambulatory Visit: Payer: Self-pay

## 2016-09-08 MED FILL — ALPRAZolam 0.5 MG TABS: 0.5 | 30 days supply | Qty: 90 | Fill #1

## 2016-10-12 MED FILL — HYDROCHLOROTHIAZIDE 12.5 MG: 12.5 | 90 days supply | Qty: 90 | Fill #1

## 2016-10-12 MED FILL — ALPRAZolam 0.5 MG TABS: 0.5 | 30 days supply | Qty: 90 | Fill #2

## 2016-11-05 ENCOUNTER — Ambulatory Visit: Payer: 59 | Admitting: Family Medicine

## 2016-11-19 ENCOUNTER — Ambulatory Visit (HOSPITAL_COMMUNITY)
Admission: RE | Admit: 2016-11-19 | Discharge: 2016-11-19 | Disposition: A | Discharge status: Home / Self Care | Payer: 59 | Source: Ambulatory Visit | Attending: Family Medicine | Admitting: Family Medicine

## 2016-11-19 ENCOUNTER — Encounter: Payer: Self-pay | Admitting: Family Medicine

## 2016-11-19 ENCOUNTER — Ambulatory Visit (INDEPENDENT_AMBULATORY_CARE_PROVIDER_SITE_OTHER): Payer: 59 | Admitting: Family Medicine

## 2016-11-19 ENCOUNTER — Other Ambulatory Visit: Payer: Self-pay | Admitting: Family Medicine

## 2016-11-19 VITALS — BP 134/80 | HR 90 | Temp 98.1°F | Resp 14 | Ht 65.0 in | Wt 206.0 lb

## 2016-11-19 DIAGNOSIS — Z1231 Encounter for screening mammogram for malignant neoplasm of breast: Secondary | ICD-10-CM

## 2016-11-19 DIAGNOSIS — D509 Iron deficiency anemia, unspecified: Secondary | ICD-10-CM

## 2016-11-19 DIAGNOSIS — F419 Anxiety disorder, unspecified: Secondary | ICD-10-CM

## 2016-11-19 DIAGNOSIS — Z1239 Encounter for other screening for malignant neoplasm of breast: Secondary | ICD-10-CM

## 2016-11-19 DIAGNOSIS — Z9884 Bariatric surgery status: Secondary | ICD-10-CM | POA: Diagnosis not present

## 2016-11-19 DIAGNOSIS — I10 Essential (primary) hypertension: Secondary | ICD-10-CM

## 2016-11-19 LAB — CBC WITH DIFFERENTIAL/PLATELET
BASOS ABS: 0 {cells}/uL (ref 0–200)
Basophils Relative: 0 %
Eosinophils Absolute: 110 cells/uL (ref 15–500)
Eosinophils Relative: 2 %
HCT: 37.6 % (ref 35.0–45.0)
Hemoglobin: 12.4 g/dL (ref 12.0–15.0)
LYMPHS PCT: 27 %
Lymphs Abs: 1485 cells/uL (ref 850–3900)
MCH: 28.2 pg (ref 27.0–33.0)
MCHC: 33 g/dL (ref 32.0–36.0)
MCV: 85.6 fL (ref 80.0–100.0)
MONOS PCT: 5 %
MPV: 8.8 fL (ref 7.5–12.5)
Monocytes Absolute: 275 cells/uL (ref 200–950)
NEUTROS PCT: 66 %
Neutro Abs: 3630 cells/uL (ref 1500–7800)
PLATELETS: 256 10*3/uL (ref 140–400)
RBC: 4.39 MIL/uL (ref 3.80–5.10)
RDW: 14.1 % (ref 11.0–15.0)
WBC: 5.5 10*3/uL (ref 3.8–10.8)

## 2016-11-19 LAB — TSH: TSH: 0.94 m[IU]/L

## 2016-11-19 MED ORDER — ALPRAZOLAM 0.5 MG PO TABS: 0.5000 mg | ORAL_TABLET | Freq: Three times a day (TID) | ORAL | 3 refills | Status: DC | PRN

## 2016-11-19 MED FILL — ALPRAZolam 0.5 MG TABS: 0.5 | 30 days supply | Qty: 90 | Fill #0

## 2016-11-19 NOTE — Assessment & Plan Note (Signed)
Continue alprazolam refill today

## 2016-11-19 NOTE — Patient Instructions (Signed)
We will call with lab results  F/U 6 months for Physical  

## 2016-11-19 NOTE — Assessment & Plan Note (Signed)
Controlled for now will continue HCTZ 12.5 mg.

## 2016-11-19 NOTE — Progress Notes (Signed)
   Subjective:    Patient ID: Michelle Wilson, female    DOB: 02-24-1976, 41 y.o.   MRN: [id number]  Patient presents for 4 month F/U (is fasting)  Patient here to follow-up chronic medical problems. She is status post bariatric surgery she continues to lose weight and is now up to 1200 cal a day. She is now started exercising as well. She has a list of labs including vitamin levels need to be obtained from her surgeon.  Hypertension she is checking her blood pressures typically staying in about 116-120/80 or less she is on hydrochlorothiazide 12.5 mg  Anxiet-overall she feels that she is doing very well. She is more active with her family still has a very stressful job. She does use Xanax as needed and requests a refill on this.   Review Of Systems:  GEN- denies fatigue, fever, weight loss,weakness, recent illness HEENT- denies eye drainage, change in vision, nasal discharge, CVS- denies chest pain, palpitations RESP- denies SOB, cough, wheeze ABD- denies N/V, change in stools, abd pain GU- denies dysuria, hematuria, dribbling, incontinence MSK- denies joint pain, muscle aches, injury Neuro- denies headache, dizziness, syncope, seizure activity       Objective:    BP 134/80   Pulse 90   Temp 98.1 F (36.7 C) (Oral)   Resp 14   Ht 5\' 5"  (1.651 m)   Wt 206 lb (93.4 kg)   LMP 11/01/2016 Comment: regular  SpO2 97%   BMI 34.28 kg/m  GEN- NAD, alert and oriented x3 HEENT- PERRL, EOMI, non injected sclera, pink conjunctiva, MMM, oropharynx clear Neck- Supple, no thyromegaly CVS- RRR, no murmur RESP-CTAB Psych- Normal affect and mood  EXT- No edema Pulses- Radial, DP- 2+        Assessment & Plan:      Problem List Items Addressed This Visit    Anemia   Relevant Orders   Iron   S/P laparoscopic sleeve gastrectomy Dec 2017    She is doing well with her weight loss. Goal is 180 pounds. She is starting her exercise routine. She feels much better in general she has  more energy and overall just doesn't like her health is improved significantly with the weight loss. Her blood pressure has been well controlled as well. I think she continues to go down on her weight she will be able to come off of her hydrochlorothiazide.      Relevant Orders   TSH   VITAMIN D 25 Hydroxy (Vit-D Deficiency, Fractures)   Vitamin B12   Vitamin B1   Folate   Copper, Serum   Zinc   Vitamin A   Iron   Essential hypertension, benign - Primary    Controlled for now will continue HCTZ 12.5 mg.      Relevant Orders   CBC with Differential/Platelet   Lipid panel   TSH   Anxiety    Continue alprazolam refill today      Relevant Medications   ALPRAZolam (XANAX) 0.5 MG tablet    Other Visit Diagnoses    Breast cancer screening       Relevant Orders   MM SCREENING BREAST TOMO BILATERAL      Note: This dictation was prepared with Dragon dictation along with smaller phrase technology. Any transcriptional errors that result from this process are unintentional.

## 2016-11-19 NOTE — Assessment & Plan Note (Signed)
She is doing well with her weight loss. Goal is 180 pounds. She is starting her exercise routine. She feels much better in general she has more energy and overall just doesn't like her health is improved significantly with the weight loss. Her blood pressure has been well controlled as well. I think she continues to go down on her weight she will be able to come off of her hydrochlorothiazide.

## 2016-11-20 LAB — FOLATE: Folate: >24 ng/mL (ref >5.4)

## 2016-11-20 LAB — LIPID PANEL
CHOL/HDL RATIO: 3.2 ratio (ref <5.0)
Cholesterol: 205 mg/dL — ABNORMAL HIGH (ref <200)
HDL: 64 mg/dL (ref >50)
LDL Cholesterol: 127 mg/dL — ABNORMAL HIGH (ref <100)
Triglycerides: 69 mg/dL (ref <150)
VLDL: 14 mg/dL (ref <30)

## 2016-11-20 LAB — VITAMIN D 25 HYDROXY (VIT D DEFICIENCY, FRACTURES): Vit D, 25-Hydroxy: 54 ng/mL (ref 30–100)

## 2016-11-20 LAB — VITAMIN B12: Vitamin B-12: >2000 pg/mL — ABNORMAL HIGH (ref 200–1100)

## 2016-11-20 LAB — IRON: IRON: 57 ug/dL (ref 40–190)

## 2016-11-22 LAB — COPPER, SERUM: Copper: 139 ug/dL (ref 70–175)

## 2016-11-22 LAB — ZINC: ZINC: 81 ug/dL (ref 60–130)

## 2016-11-24 LAB — VITAMIN A: VITAMIN A (RETINOIC ACID): 48 ug/dL (ref 38–98)

## 2016-11-25 LAB — VITAMIN B1: Vitamin B1 (Thiamine): 68 nmol/L — ABNORMAL HIGH (ref 8–30)

## 2016-12-12 DIAGNOSIS — J029 Acute pharyngitis, unspecified: Secondary | ICD-10-CM | POA: Diagnosis not present

## 2016-12-12 DIAGNOSIS — H6501 Acute serous otitis media, right ear: Secondary | ICD-10-CM | POA: Diagnosis not present

## 2016-12-12 DIAGNOSIS — L03011 Cellulitis of right finger: Secondary | ICD-10-CM | POA: Diagnosis not present

## 2017-01-13 DIAGNOSIS — Z01419 Encounter for gynecological examination (general) (routine) without abnormal findings: Secondary | ICD-10-CM | POA: Diagnosis not present

## 2017-01-13 DIAGNOSIS — Z124 Encounter for screening for malignant neoplasm of cervix: Secondary | ICD-10-CM | POA: Diagnosis not present

## 2017-01-13 DIAGNOSIS — N898 Other specified noninflammatory disorders of vagina: Secondary | ICD-10-CM | POA: Diagnosis not present

## 2017-01-13 DIAGNOSIS — R6882 Decreased libido: Secondary | ICD-10-CM | POA: Diagnosis not present

## 2017-01-21 MED FILL — HYDROCHLOROTHIAZIDE 12.5 MG: 12.5 | 90 days supply | Qty: 90 | Fill #2

## 2017-01-26 DIAGNOSIS — Z9884 Bariatric surgery status: Secondary | ICD-10-CM | POA: Diagnosis not present

## 2017-02-14 MED FILL — ALPRAZolam 0.5 MG TABS: 0.5 | 30 days supply | Qty: 90 | Fill #1

## 2017-03-18 MED FILL — ALPRAZolam 0.5 MG TABS: 0.5 | 30 days supply | Qty: 90 | Fill #2

## 2017-04-18 MED FILL — ALPRAZolam 0.5 MG TABS: 0.5 | 30 days supply | Qty: 90 | Fill #3

## 2017-04-18 MED FILL — HYDROCHLOROTHIAZIDE 12.5 MG: 12.5 | 90 days supply | Qty: 90 | Fill #3

## 2017-05-13 ENCOUNTER — Encounter: Payer: Self-pay | Admitting: Family Medicine

## 2017-05-13 ENCOUNTER — Ambulatory Visit (INDEPENDENT_AMBULATORY_CARE_PROVIDER_SITE_OTHER): Payer: 59 | Admitting: Family Medicine

## 2017-05-13 ENCOUNTER — Other Ambulatory Visit: Payer: Self-pay

## 2017-05-13 VITALS — BP 140/82 | HR 82 | Temp 98.1°F | Resp 16 | Ht 65.0 in | Wt 203.0 lb

## 2017-05-13 DIAGNOSIS — Z0000 Encounter for general adult medical examination without abnormal findings: Secondary | ICD-10-CM

## 2017-05-13 DIAGNOSIS — E66811 Obesity, class 1: Secondary | ICD-10-CM

## 2017-05-13 DIAGNOSIS — Z1321 Encounter for screening for nutritional disorder: Secondary | ICD-10-CM | POA: Diagnosis not present

## 2017-05-13 DIAGNOSIS — E669 Obesity, unspecified: Secondary | ICD-10-CM

## 2017-05-13 DIAGNOSIS — Z6833 Body mass index (BMI) 33.0-33.9, adult: Secondary | ICD-10-CM | POA: Diagnosis not present

## 2017-05-13 DIAGNOSIS — I10 Essential (primary) hypertension: Secondary | ICD-10-CM | POA: Diagnosis not present

## 2017-05-13 DIAGNOSIS — F419 Anxiety disorder, unspecified: Secondary | ICD-10-CM

## 2017-05-13 DIAGNOSIS — Z9884 Bariatric surgery status: Secondary | ICD-10-CM | POA: Diagnosis not present

## 2017-05-13 MED ORDER — ESCITALOPRAM OXALATE 5 MG PO TABS: 5.0000 mg | ORAL_TABLET | Freq: Every day | ORAL | 3 refills | Status: DC

## 2017-05-13 MED ORDER — ALPRAZOLAM 0.5 MG PO TABS: 0.5000 mg | ORAL_TABLET | Freq: Three times a day (TID) | ORAL | 3 refills | Status: DC | PRN

## 2017-05-13 MED ORDER — HYDROCHLOROTHIAZIDE 12.5 MG PO TABS: 12.5000 mg | ORAL_TABLET | Freq: Every day | ORAL | 3 refills | Status: DC

## 2017-05-13 MED FILL — ESCITALOPRAM 5 MG TABLET: 5 | 30 days supply | Qty: 30 | Fill #0

## 2017-05-13 NOTE — Assessment & Plan Note (Signed)
Continues to do well s/p surgery Labs to be done

## 2017-05-13 NOTE — Assessment & Plan Note (Signed)
Controlled, she gets some anxiety in the office Continue to monitor at home

## 2017-05-13 NOTE — Assessment & Plan Note (Signed)
Lexapro to be started, discussed the SE of medication Continue xanax as needed

## 2017-05-13 NOTE — Patient Instructions (Signed)
I recommend eye visit once a year I recommend dental visit every 6 months Goal is to  Exercise 30 minutes 5 days a week We will send a letter with lab results  F/U 2 months   

## 2017-05-13 NOTE — Progress Notes (Signed)
   Subjective:    Patient ID: Michelle Wilson, female    DOB: [birth year], 42 y.o.   MRN: [id number]  Patient presents for CPE (is fasting)   Pt here for CPE, she is 13 months post gastic surgery. Goal is  180lbs  GYN- Dr. Normand Sloopillard,     Lexapro was recommended by GYN, she has some irritability and mood changes, also has some vaginal dryness.  Have underlying anxiety which she does have to take her Xanax almost every night to help her sleep.  She does take during the day as needed.  She was a little worried about the side effects of starting the Lexapro.  Her irritability and mood changes and not affected her job but she still gets overwhelmed handling motherhood her job and focusing on her health nutrition.  HTN- BP has been good, running 110-120/70-80's, still    Mammogrma UTD PAP Smear UTD done Sept 2018  Immunizations UTD   Exercising now   Review Of Systems:  GEN- denies fatigue, fever, weight loss,weakness, recent illness HEENT- denies eye drainage, change in vision, nasal discharge, CVS- denies chest pain, palpitations RESP- denies SOB, cough, wheeze ABD- denies N/V, change in stools, abd pain GU- denies dysuria, hematuria, dribbling, incontinence MSK- denies joint pain, muscle aches, injury Neuro- denies headache, dizziness, syncope, seizure activity       Objective:    BP 140/82   Pulse 82   Temp 98.1 F (36.7 C) (Oral)   Resp 16   Ht 5\' 5"  (1.651 m)   Wt 203 lb (92.1 kg)   SpO2 99%   BMI 33.78 kg/m  GEN- NAD, alert and oriented x3 HEENT- PERRL, EOMI, non injected sclera, pink conjunctiva, MMM, oropharynx clear Neck- Supple, no thyromegaly CVS- RRR, no murmur RESP-CTAB ABD-NABS,soft,NT,ND PSYCH- normal affect and mood,well groomed  EXT- No edema Pulses- Radial, DP- 2+        Assessment & Plan:      Problem List Items Addressed This Visit      Unprioritized   Obesity    Continues to do well s/p surgery Labs to be done       Essential  hypertension, benign    Controlled, she gets some anxiety in the office Continue to monitor at home      Relevant Medications   hydrochlorothiazide (HYDRODIURIL) 12.5 MG tablet   Other Relevant Orders   CBC with Differential/Platelet   Comprehensive metabolic panel   Lipid panel   Anxiety    Lexapro to be started, discussed the SE of medication Continue xanax as needed      Relevant Medications   escitalopram (LEXAPRO) 5 MG tablet   ALPRAZolam (XANAX) 0.5 MG tablet    Other Visit Diagnoses    Routine general medical examination at a health care facility    -  Primary   Relevant Orders   CBC with Differential/Platelet   Comprehensive metabolic panel   Lipid panel   S/P bariatric surgery       Relevant Orders   Vitamin B12   Vitamin D, 25-hydroxy   Iron   Encounter for vitamin deficiency screening       Relevant Orders   Vitamin D, 25-hydroxy      Note: This dictation was prepared with Dragon dictation along with smaller phrase technology. Any transcriptional errors that result from this process are unintentional.

## 2017-05-14 LAB — COMPREHENSIVE METABOLIC PANEL
AG Ratio: 1.3 (calc) (ref 1.0–2.5)
ALBUMIN MSPROF: 3.9 g/dL (ref 3.6–5.1)
ALT: 15 U/L (ref 6–29)
AST: 16 U/L (ref 10–30)
Alkaline phosphatase (APISO): 84 U/L (ref 33–115)
BILIRUBIN TOTAL: 0.4 mg/dL (ref 0.2–1.2)
BUN: 12 mg/dL (ref 7–25)
CALCIUM: 9.2 mg/dL (ref 8.6–10.2)
CO2: 28 mmol/L (ref 20–32)
Chloride: 103 mmol/L (ref 98–110)
Creat: 0.8 mg/dL (ref 0.50–1.10)
Globulin: 3 g/dL (calc) (ref 1.9–3.7)
Glucose, Bld: 85 mg/dL (ref 65–99)
Potassium: 3.5 mmol/L (ref 3.5–5.3)
Sodium: 140 mmol/L (ref 135–146)
Total Protein: 6.9 g/dL (ref 6.1–8.1)

## 2017-05-14 LAB — CBC WITH DIFFERENTIAL/PLATELET
BASOS ABS: 18 {cells}/uL (ref 0–200)
BASOS PCT: 0.4 %
EOS PCT: 0.9 %
Eosinophils Absolute: 41 cells/uL (ref 15–500)
HEMATOCRIT: 37.4 % (ref 35.0–45.0)
HEMOGLOBIN: 12.5 g/dL (ref 11.7–15.5)
LYMPHS ABS: 1449 {cells}/uL (ref 850–3900)
MCH: 28.2 pg (ref 27.0–33.0)
MCHC: 33.4 g/dL (ref 32.0–36.0)
MCV: 84.4 fL (ref 80.0–100.0)
MPV: 9.7 fL (ref 7.5–12.5)
Monocytes Relative: 5.8 %
NEUTROS ABS: 2824 {cells}/uL (ref 1500–7800)
Neutrophils Relative %: 61.4 %
Platelets: 259 10*3/uL (ref 140–400)
RBC: 4.43 10*6/uL (ref 3.80–5.10)
RDW: 12.8 % (ref 11.0–15.0)
Total Lymphocyte: 31.5 %
WBC mixed population: 267 cells/uL (ref 200–950)
WBC: 4.6 10*3/uL (ref 3.8–10.8)

## 2017-05-14 LAB — VITAMIN B12

## 2017-05-14 LAB — LIPID PANEL
CHOL/HDL RATIO: 2.9 (calc) (ref <5.0)
Cholesterol: 213 mg/dL — ABNORMAL HIGH (ref <200)
HDL: 74 mg/dL (ref >50)
LDL CHOLESTEROL (CALC): 124 mg/dL — AB
Non-HDL Cholesterol (Calc): 139 mg/dL (calc) — ABNORMAL HIGH (ref <130)
Triglycerides: 55 mg/dL (ref <150)

## 2017-05-14 LAB — VITAMIN D 25 HYDROXY (VIT D DEFICIENCY, FRACTURES): Vit D, 25-Hydroxy: 44 ng/mL (ref 30–100)

## 2017-05-14 LAB — IRON: Iron: 43 ug/dL (ref 40–190)

## 2017-05-17 MED FILL — ALPRAZolam 0.5 MG TABS: 0.5 | 30 days supply | Qty: 90 | Fill #0

## 2017-06-16 MED FILL — ALPRAZolam 0.5 MG TABS: 0.5 | 30 days supply | Qty: 90 | Fill #1

## 2017-07-14 MED FILL — HYDROCHLOROTHIAZIDE 12.5 MG: 12.5 | 90 days supply | Qty: 90 | Fill #0

## 2017-07-14 MED FILL — ALPRAZolam 0.5 MG TABS: 0.5 | 30 days supply | Qty: 90 | Fill #2

## 2017-07-22 ENCOUNTER — Ambulatory Visit (INDEPENDENT_AMBULATORY_CARE_PROVIDER_SITE_OTHER): Payer: 59 | Admitting: Family Medicine

## 2017-07-22 ENCOUNTER — Encounter: Payer: Self-pay | Admitting: Family Medicine

## 2017-07-22 ENCOUNTER — Other Ambulatory Visit: Payer: Self-pay

## 2017-07-22 VITALS — BP 134/80 | HR 98 | Temp 98.0°F | Resp 14 | Ht 65.0 in | Wt 206.0 lb

## 2017-07-22 DIAGNOSIS — E785 Hyperlipidemia, unspecified: Secondary | ICD-10-CM | POA: Insufficient documentation

## 2017-07-22 DIAGNOSIS — F419 Anxiety disorder, unspecified: Secondary | ICD-10-CM

## 2017-07-22 DIAGNOSIS — D509 Iron deficiency anemia, unspecified: Secondary | ICD-10-CM

## 2017-07-22 DIAGNOSIS — I10 Essential (primary) hypertension: Secondary | ICD-10-CM

## 2017-07-22 NOTE — Assessment & Plan Note (Signed)
Recheck Iron stores, ferritin in 2 months

## 2017-07-22 NOTE — Assessment & Plan Note (Signed)
Elevated here in the office but her home readings are good.  I think this is tied to some of her anxiety I am can have her monitor her blood pressure at home if her systolics are going up at home especially greater than 130, will increase to 25mg  HCTZ

## 2017-07-22 NOTE — Assessment & Plan Note (Signed)
Recheck lipids in 2 months, dietary changes

## 2017-07-22 NOTE — Progress Notes (Signed)
   Subjective:    Patient ID: Michelle Wilson, female    DOB: [birth year], 42 y.o.   MRN: [id number]  Patient presents for Follow-up (is fasting)  She is here for interim follow-up on  Lexapro  to treat her anxiety and mood changes however she states that the medication made her feel funny so she stopped it.  She still taking her Xanax every night.  HTN- taking HCTZ, last visit BP elevated at  140/82 , home readings 117-120/ 70-80  Review Of Systems:  GEN- denies fatigue, fever, weight loss,weakness, recent illness HEENT- denies eye drainage, change in vision, nasal discharge, CVS- denies chest pain, palpitations RESP- denies SOB, cough, wheeze Neuro- denies headache, dizziness, syncope, seizure activity       Objective:    BP 134/80   Pulse 98   Temp 98 F (36.7 C) (Oral)   Resp 14   Ht 5\' 5"  (1.651 m)   Wt 206 lb (93.4 kg)   LMP 07/17/2017   SpO2 100%   BMI 34.28 kg/m  GEN- NAD, alert and oriented x3 HEENT- PERRL, EOMI, non injected sclera, pink conjunctiva, MMM, oropharynx clear CVS- RRR, no murmur RESP-CTAB Psych- normal affect and mood  EXT- No edema Pulses- Radial 2+        Assessment & Plan:      Problem List Items Addressed This Visit      Unprioritized   Mild hyperlipidemia    Recheck lipids in 2 months, dietary changes      Essential hypertension, benign    Elevated here in the office but her home readings are good.  I think this is tied to some of her anxiety I am can have her monitor her blood pressure at home if her systolics are going up at home especially greater than 130, will increase to 25mg  HCTZ      Anxiety - Primary    Chronic anxiety but does not want to try any other medication.  She is actually starting yoga or meditation which I think would be beneficial to her.  She does have a stressful job.  We will continue the alprazolam for now.      Anemia    Recheck Iron stores, ferritin in 2 months         Note: This dictation was  prepared with Dragon dictation along with smaller phrase technology. Any transcriptional errors that result from this process are unintentional.

## 2017-07-22 NOTE — Assessment & Plan Note (Signed)
Chronic anxiety but does not want to try any other medication.  She is actually starting yoga or meditation which I think would be beneficial to her.  She does have a stressful job.  We will continue the alprazolam for now.

## 2017-07-22 NOTE — Patient Instructions (Addendum)
Email or call me if BP 130/80 F/U 2 months for Lab visit - fasting

## 2017-07-27 ENCOUNTER — Ambulatory Visit (INDEPENDENT_AMBULATORY_CARE_PROVIDER_SITE_OTHER): Payer: 59 | Admitting: Family Medicine

## 2017-07-27 ENCOUNTER — Other Ambulatory Visit: Payer: Self-pay

## 2017-07-27 ENCOUNTER — Encounter: Payer: Self-pay | Admitting: Family Medicine

## 2017-07-27 VITALS — BP 118/66 | HR 96 | Temp 99.5°F | Resp 12 | Ht 65.0 in | Wt 205.0 lb

## 2017-07-27 DIAGNOSIS — R509 Fever, unspecified: Secondary | ICD-10-CM

## 2017-07-27 DIAGNOSIS — J029 Acute pharyngitis, unspecified: Secondary | ICD-10-CM | POA: Diagnosis not present

## 2017-07-27 LAB — INFLUENZA A AND B AG, IMMUNOASSAY
INFLUENZA A ANTIGEN: NOT DETECTED
INFLUENZA B ANTIGEN: NOT DETECTED

## 2017-07-27 MED ORDER — FLUCONAZOLE 150 MG PO TABS: 150.0000 mg | ORAL_TABLET | Freq: Once | ORAL | 1 refills | Status: DC

## 2017-07-27 MED ORDER — CEFDINIR 300 MG PO CAPS: 300.0000 mg | ORAL_CAPSULE | Freq: Two times a day (BID) | ORAL | 0 refills | Status: DC

## 2017-07-27 MED ORDER — FLUCONAZOLE 150 MG PO TABS: 150.0000 mg | ORAL_TABLET | Freq: Once | ORAL | 1 refills | Status: AC

## 2017-07-27 NOTE — Progress Notes (Signed)
   Subjective:    Patient ID: Michelle Wilson, female    DOB: [birth year], 42 y.o.   MRN: [id number]  Patient presents for Illness (x2 days- sore throat, lymph node swollen and sore, positive exposure to Strep )   Low grade fever, sore throat, body aches, swollen glands for 2 days , , Took tylenol this morning. Taking clartin    Son has Strep throat        Review Of Systems: per above   GEN- denies fatigue,+ fever, weight loss,weakness, recent illness HEENT- denies eye drainage, change in vision, nasal discharge, CVS- denies chest pain, palpitations RESP- denies SOB, cough, wheeze ABD- denies N/V, change in stools, abd pain GU- denies dysuria, hematuria, dribbling, incontinence MSK- denies joint pain, +muscle aches, injury Neuro- denies headache, dizziness, syncope, seizure activity       Objective:    BP 118/66   Pulse 96   Temp 99.5 F (37.5 C) (Oral)   Resp 12   Ht 5\' 5"  (1.651 m)   Wt 205 lb (93 kg)   LMP 07/17/2017   SpO2 99%   BMI 34.11 kg/m  GEN- NAD, alert and oriented x3 HEENT- PERRL, EOMI, non injected sclera, pink conjunctiva, MMM, oropharynx mild injection, no exudate TM clear bilat no effusion,  No  maxillary sinus tenderness, nares clear Neck- Supple,+ ant and submandibular  LAD CVS- RRR, no murmur RESP-CTAB EXT- No edema Pulses- Radial 2+         Assessment & Plan:      Problem List Items Addressed This Visit    None    Visit Diagnoses    Pharyngitis, unspecified etiology    -  Primary   Start treatment to cover strep, exposure plus symtoms, omnicef. Given diflucan due to yeast after antibiotics, flu neg   Relevant Orders   STREP GROUP A AG, W/REFLEX TO CULT (Completed)   Fever, unspecified       Relevant Orders   Influenza A and B Ag, Immunoassay (Completed)      Note: This dictation was prepared with Dragon dictation along with smaller phrase technology. Any transcriptional errors that result from this process are unintentional.

## 2017-07-27 NOTE — Patient Instructions (Signed)
F/U as needed

## 2017-07-29 LAB — CULTURE, GROUP A STREP
MICRO NUMBER:: 90411717
SOURCE:: 0
SPECIMEN QUALITY:: ADEQUATE

## 2017-07-29 LAB — STREP GROUP A AG, W/REFLEX TO CULT: STREPTOCOCCUS, GROUP A SCREEN (DIRECT): NOT DETECTED

## 2017-07-30 DIAGNOSIS — Z0100 Encounter for examination of eyes and vision without abnormal findings: Secondary | ICD-10-CM | POA: Diagnosis not present

## 2017-08-17 MED FILL — ALPRAZolam 0.5 MG TABS: 0.5 | 30 days supply | Qty: 90 | Fill #3

## 2017-09-20 ENCOUNTER — Encounter: Payer: Self-pay | Admitting: Family Medicine

## 2017-09-20 ENCOUNTER — Other Ambulatory Visit: Payer: Self-pay | Admitting: Family Medicine

## 2017-09-20 MED FILL — ALPRAZolam 0.5 MG TABS: 0.5 | 30 days supply | Qty: 90 | Fill #0

## 2017-09-20 NOTE — Telephone Encounter (Signed)
Ok to refill??  Last office visit 07/27/2017.  Last refill 05/13/2017, #3 refills.

## 2017-09-23 ENCOUNTER — Other Ambulatory Visit: Payer: 59

## 2017-10-06 ENCOUNTER — Other Ambulatory Visit: Payer: Self-pay | Admitting: Family Medicine

## 2017-10-06 DIAGNOSIS — Z1231 Encounter for screening mammogram for malignant neoplasm of breast: Secondary | ICD-10-CM

## 2017-10-14 ENCOUNTER — Other Ambulatory Visit: Payer: 59

## 2017-10-14 DIAGNOSIS — D509 Iron deficiency anemia, unspecified: Secondary | ICD-10-CM | POA: Diagnosis not present

## 2017-10-14 LAB — CBC WITH DIFFERENTIAL/PLATELET
BASOS PCT: 0.3 %
Basophils Absolute: 18 cells/uL (ref 0–200)
EOS ABS: 49 {cells}/uL (ref 15–500)
Eosinophils Relative: 0.8 %
HEMATOCRIT: 36.2 % (ref 35.0–45.0)
HEMOGLOBIN: 12 g/dL (ref 11.7–15.5)
LYMPHS ABS: 2025 {cells}/uL (ref 850–3900)
MCH: 27.8 pg (ref 27.0–33.0)
MCHC: 33.1 g/dL (ref 32.0–36.0)
MCV: 84 fL (ref 80.0–100.0)
MPV: 9.6 fL (ref 7.5–12.5)
Monocytes Relative: 6.5 %
NEUTROS ABS: 3611 {cells}/uL (ref 1500–7800)
Neutrophils Relative %: 59.2 %
Platelets: 250 10*3/uL (ref 140–400)
RBC: 4.31 10*6/uL (ref 3.80–5.10)
RDW: 13 % (ref 11.0–15.0)
Total Lymphocyte: 33.2 %
WBC: 6.1 10*3/uL (ref 3.8–10.8)
WBCMIX: 397 {cells}/uL (ref 200–950)

## 2017-10-14 LAB — LIPID PANEL
CHOL/HDL RATIO: 3.3 (calc) (ref <5.0)
Cholesterol: 205 mg/dL — ABNORMAL HIGH (ref <200)
HDL: 62 mg/dL (ref >50)
LDL CHOLESTEROL (CALC): 129 mg/dL — AB
Non-HDL Cholesterol (Calc): 143 mg/dL (calc) — ABNORMAL HIGH (ref <130)
TRIGLYCERIDES: 45 mg/dL (ref <150)

## 2017-10-14 LAB — IRON,TIBC AND FERRITIN PANEL
%SAT: 17 % (calc) (ref 16–45)
Ferritin: 18 ng/mL (ref 16–232)
IRON: 53 ug/dL (ref 40–190)
TIBC: 317 mcg/dL (calc) (ref 250–450)

## 2017-10-17 MED FILL — HYDROCHLOROTHIAZIDE 12.5 MG: 12.5 | 90 days supply | Qty: 90 | Fill #1

## 2017-10-28 MED FILL — ALPRAZolam 0.5 MG TABS: 0.5 | 30 days supply | Qty: 90 | Fill #1

## 2017-11-25 ENCOUNTER — Ambulatory Visit (HOSPITAL_COMMUNITY): Payer: 59

## 2017-12-05 MED FILL — ALPRAZolam 0.5 MG TABS: 0.5 | 30 days supply | Qty: 90 | Fill #2

## 2017-12-23 ENCOUNTER — Ambulatory Visit (HOSPITAL_COMMUNITY): Payer: 59

## 2018-01-09 MED FILL — ALPRAZolam 0.5 MG TABS: 0.5 | 30 days supply | Qty: 90 | Fill #3

## 2018-01-09 MED FILL — HYDROCHLOROTHIAZIDE 12.5 MG: 12.5 | 90 days supply | Qty: 90 | Fill #2

## 2018-01-13 ENCOUNTER — Encounter (HOSPITAL_COMMUNITY): Payer: Self-pay

## 2018-01-13 ENCOUNTER — Ambulatory Visit (HOSPITAL_COMMUNITY)
Admission: RE | Admit: 2018-01-13 | Discharge: 2018-01-13 | Disposition: A | Discharge status: Home / Self Care | Payer: 59 | Source: Ambulatory Visit | Attending: Family Medicine | Admitting: Family Medicine

## 2018-01-13 DIAGNOSIS — Z1231 Encounter for screening mammogram for malignant neoplasm of breast: Secondary | ICD-10-CM | POA: Insufficient documentation

## 2018-01-27 ENCOUNTER — Ambulatory Visit (INDEPENDENT_AMBULATORY_CARE_PROVIDER_SITE_OTHER): Payer: 59 | Admitting: Family Medicine

## 2018-01-27 ENCOUNTER — Encounter: Payer: Self-pay | Admitting: Family Medicine

## 2018-01-27 VITALS — BP 130/84 | HR 84 | Temp 97.7°F | Resp 16 | Ht 65.0 in | Wt 203.2 lb

## 2018-01-27 DIAGNOSIS — Z760 Encounter for issue of repeat prescription: Secondary | ICD-10-CM | POA: Diagnosis not present

## 2018-01-27 DIAGNOSIS — Z01419 Encounter for gynecological examination (general) (routine) without abnormal findings: Secondary | ICD-10-CM | POA: Diagnosis not present

## 2018-01-27 MED ORDER — ALPRAZOLAM 0.5 MG PO TABS: 0.5000 mg | ORAL_TABLET | Freq: Three times a day (TID) | ORAL | 3 refills | Status: DC | PRN

## 2018-01-27 NOTE — Patient Instructions (Signed)
Pap test screening done.  Medications refilled for Xanax - per past Rx from Dr. Jeanice Lim  The HCTZ appears to have a few more 90 d refills, can request through pharmacy when out  Pap Test Why am I having this test? A pap test is sometimes called a pap smear. It is a screening test that is used to check for signs of cancer of the vagina, cervix, and uterus. The test can also identify the presence of infection or precancerous changes. Your health care provider will likely recommend you have this test done on a regular basis. This test may be done:  Every 3 years, starting at age 41.  Every 5 years, in combination with testing for the presence of human papillomavirus (HPV).  More or less often depending on other medical conditions.  What kind of sample is taken? Using a small cotton swab, plastic spatula, or brush, your health care provider will collect a sample of cells from the surface of your cervix. Your cervix is the opening to your uterus, also called a womb. Secretions from the cervix and vagina may also be collected. How do I prepare for this test?  Be aware of where you are in your menstrual cycle. You may be asked to reschedule the test if you are menstruating on the day of the test.  You may need to reschedule if you have a known vaginal infection on the day of the test.  You may be asked to avoid douching or taking a bath the day before or the day of the test.  Some medicines can cause abnormal test results, such as digitalis and tetracycline. Talk with your health care provider before your test if you take one of these medicines. What do the results mean? Abnormal test results may indicate a number of health conditions. These may include:  Cancer. Although pap test results cannot be used to diagnose cancer of the cervix, vagina, or uterus, they may suggest the possibility of cancer. Further tests would be required to determine if cancer is present.  Sexually transmitted  disease.  Fungal infection.  Parasite infection.  Herpes infection.  A condition causing or contributing to infertility.  It is your responsibility to obtain your test results. Ask the lab or department performing the test when and how you will get your results. Contact your health care provider to discuss any questions you have about your results. Talk with your health care provider to discuss your results, treatment options, and if necessary, the need for more tests. Talk with your health care provider if you have any questions about your results. This information is not intended to replace advice given to you by your health care provider. Make sure you discuss any questions you have with your health care provider. Document Released: 07/03/2002 Document Revised: 12/17/2015 Document Reviewed: 09/03/2013 Elsevier Interactive Patient Education  [employer].

## 2018-01-27 NOTE — Progress Notes (Signed)
Patient ID: Michelle Wilson, female    DOB: 12-03-1975, 42 y.o.   MRN: [id number]  PCP: Salley Scarlet, MD  Chief Complaint  Patient presents with  . Gynecologic Exam    Subjective:   Michelle Wilson is a 42 y.o. female, presents to clinic for gynecological exam, she is already had her CPE and is here to have a Pap and pelvic exam done.  She reports history of bilateral salpingectomy, no other gynecological procedures, diagnoses or current complaints.  He has rescheduled several times due to her menstrual schedule, today does have some scant light bleeding.  He recently had a mammogram done. She requests medication refills on her blood pressure medication and on her antianxiety medication.   Patient Active Problem List   Diagnosis Date Noted  . Mild hyperlipidemia 07/22/2017  . Anemia 05/07/2016  . 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 Essential hypertension, benign 08/19/2011  . Obesity 08/19/2011  . Anxiety 08/19/2011  . Seasonal allergies 08/19/2011     Prior to Admission medications   Medication Sig Start Date End Date Taking? Authorizing Provider  ALPRAZolam Prudy Feeler) 0.5 MG tablet TAKE ONE TABLET BY MOUTH THREE TIMES DAILY AS NEEDED FOR ANXIETY 09/20/17  Yes De Leon, Velna Hatchet, MD  hydrochlorothiazide (HYDRODIURIL) 12.5 MG tablet Take 1 tablet (12.5 mg total) by mouth daily. 05/13/17  Yes [place], Velna Hatchet, MD  Oyster Shell (OYSTER CALCIUM) 500 MG TABS tablet Take 500 mg of elemental calcium by mouth 3 (three) times daily.   Yes [provider]  Prenatal Vit-Fe Fumarate-FA (M-VIT) tablet Take by mouth 2 (two) times daily. Bariatric Advantage w/ iron   Yes [provider]     No Known Allergies   Family History  Problem Relation Age of Onset  . Hypertension Mother   . Heart disease Mother 45       Heart attack  . Hyperlipidemia Mother   . Hypertension Father   . Hypertension Maternal Grandmother   . Hypertension  Maternal Grandfather   . Stroke Maternal Grandfather   . Hypertension Paternal Grandmother   . Hypertension Paternal Grandfather   . Hyperthyroidism Maternal Aunt        x 3  . Schizophrenia Maternal Aunt   . Cancer Maternal Uncle        Lung deceased  . Cancer Maternal Uncle        Lung Cancer      Social History   Socioeconomic History  . Marital status: Married    Spouse name: Kamila Broda  . Number of children: 1  . Years of education: Not on file  . Highest education level: Not on file  Occupational History  . Occupation: Print production planner  Social Needs  . Financial resource strain: Not on file  . Food insecurity:    Worry: Not on file    Inability: Not on file  . Transportation needs:    Medical: Not on file    Non-medical: Not on file  Tobacco Use  . Smoking status: Never Smoker  . Smokeless tobacco: Never Used  Substance and Sexual Activity  . Alcohol use: No    Comment: Occasionally, not while pregnant  . Drug use: No  . Sexual activity: Not Currently    Partners: Male    Birth control/protection: Surgical    Comment: Tubes Removed   Lifestyle  . Physical activity:    Days per week: Not on file    Minutes per session: Not  on file  . Stress: Not on file  Relationships  . Social connections:    Talks on phone: Not on file    Gets together: Not on file    Attends religious service: Not on file    Active member of club or organization: Not on file    Attends meetings of clubs or organizations: Not on file    Relationship status: Not on file  . Intimate partner violence:    Fear of current or ex partner: Not on file    Emotionally abused: Not on file    Physically abused: Not on file    Forced sexual activity: Not on file  Other Topics Concern  . Not on file  Social History Narrative  . Not on file     Review of Systems  Constitutional: Negative.   HENT: Negative.   Eyes: Negative.   Respiratory: Negative.   Cardiovascular: Negative.     Gastrointestinal: Negative.   Endocrine: Negative.   Genitourinary: Negative.   Musculoskeletal: Negative.   Skin: Negative.   Allergic/Immunologic: Negative.   Neurological: Negative.   Hematological: Negative.   Psychiatric/Behavioral: Negative.   All other systems reviewed and are negative.      Objective:    Vitals:   01/27/18 1508  BP: 130/84  Pulse: 84  Resp: 16  Temp: 97.7 F (36.5 C)  TempSrc: Oral  SpO2: 100%  Weight: 203 lb 3.2 oz (92.2 kg)  Height: 5\' 5"  (1.651 m)      Physical Exam  Constitutional: She appears well-developed.  HENT:  Head: Normocephalic and atraumatic.  Nose: Nose normal.  Eyes: Conjunctivae are normal. Right eye exhibits no discharge. Left eye exhibits no discharge.  Neck: No tracheal deviation present.  Cardiovascular: Normal rate and regular rhythm.  Pulmonary/Chest: Effort normal. No stridor. No respiratory distress.  Abdominal: Hernia confirmed negative in the right inguinal area and confirmed negative in the left inguinal area.  Genitourinary: Vagina normal and uterus normal. Pelvic exam was performed with patient supine. No labial fusion. There is no rash, tenderness, lesion or injury on the right labia. There is no rash, tenderness, lesion or injury on the left labia. Cervix exhibits no motion tenderness, no discharge (thin slow bleeding from normal appearing cervical os) and no friability. Right adnexum displays no mass, no tenderness and no fullness. Left adnexum displays no mass and no tenderness. No erythema, tenderness or bleeding in the vagina. No foreign body in the vagina. No signs of injury around the vagina. No vaginal discharge found.  Genitourinary Comments: Pelvic exam chaperoned by Phineas Semen, PAP obtained with spatula and brush  Musculoskeletal: Normal range of motion.  Neurological: She is alert. She exhibits normal muscle tone. Coordination normal.  Skin: Skin is warm and dry. No rash noted.  Psychiatric: She  has a normal mood and affect. Her behavior is normal.  Nursing note and vitals reviewed.         Assessment & Plan:      ICD-10-CM   1. Encounter for cervical Pap smear with pelvic exam Z01.419 PAP, Thin Prep w/HPV rflx HPV Type 16/18    Patient presents for routine gynecological exam and screening for cervical cancer, Pap obtained, pelvic and bimanual exam unremarkable except for she is currently on her menses and has some thin slow bleeding.  Patient's complete physical exam was done by her PCP and she preferred to have a alternative provider perform this part of her exam.    Controlled substance medications were  refilled for her after reviewing diagnosis, dosing and frequency of prescriptions that is routinely done by her PCP.  Patient also inquired about her blood pressure medication and to check on this and believe she should have a remaining 90-day refill and suggested refills be requested through her pharmacy.  She notes no elevated blood pressures at home or at work where she frequently checks it it usually runs around 110/70.  No symptoms concerning for hypertensive urgency or hypotension.  Patient had mammogram performed a few weeks ago no new symptoms or masses palpated, breast exam deferred to recent comprehensive screening and imaging  Danelle Berry, PA-C 01/27/18 3:29 PM

## 2018-02-01 LAB — PAP, TP IMAGING W/ HPV RNA, RFLX HPV TYPE 16,18/45: HPV DNA High Risk: NOT DETECTED

## 2018-02-02 ENCOUNTER — Encounter: Payer: Self-pay | Admitting: *Deleted

## 2018-02-08 MED FILL — ALPRAZolam 0.5 MG TABS: 0.5 | 30 days supply | Qty: 90 | Fill #0

## 2018-03-13 MED FILL — ALPRAZolam 0.5 MG TABS: 0.5 | 30 days supply | Qty: 90 | Fill #1

## 2018-04-13 MED FILL — HYDROCHLOROTHIAZIDE 12.5 MG: 12.5 | 90 days supply | Qty: 90 | Fill #3

## 2018-04-13 MED FILL — ALPRAZolam 0.5 MG TABS: 0.5 | 30 days supply | Qty: 90 | Fill #2

## 2018-05-22 MED FILL — ALPRAZolam 0.5 MG TABS: 0.5 | 30 days supply | Qty: 90 | Fill #3

## 2018-06-13 ENCOUNTER — Other Ambulatory Visit: Payer: Self-pay

## 2018-06-13 ENCOUNTER — Encounter: Payer: Self-pay | Admitting: Family Medicine

## 2018-06-13 ENCOUNTER — Ambulatory Visit: Payer: 59 | Admitting: Family Medicine

## 2018-06-13 VITALS — BP 136/82 | HR 68 | Temp 98.1°F | Resp 14 | Ht 65.0 in | Wt 203.0 lb

## 2018-06-13 DIAGNOSIS — E559 Vitamin D deficiency, unspecified: Secondary | ICD-10-CM

## 2018-06-13 DIAGNOSIS — E669 Obesity, unspecified: Secondary | ICD-10-CM | POA: Diagnosis not present

## 2018-06-13 DIAGNOSIS — Z9884 Bariatric surgery status: Secondary | ICD-10-CM | POA: Diagnosis not present

## 2018-06-13 DIAGNOSIS — F419 Anxiety disorder, unspecified: Secondary | ICD-10-CM | POA: Diagnosis not present

## 2018-06-13 DIAGNOSIS — Z6833 Body mass index (BMI) 33.0-33.9, adult: Secondary | ICD-10-CM | POA: Diagnosis not present

## 2018-06-13 DIAGNOSIS — D509 Iron deficiency anemia, unspecified: Secondary | ICD-10-CM

## 2018-06-13 DIAGNOSIS — I10 Essential (primary) hypertension: Secondary | ICD-10-CM

## 2018-06-13 MED ORDER — HYDROCHLOROTHIAZIDE 12.5 MG PO TABS: 12.5000 mg | ORAL_TABLET | Freq: Every day | ORAL | 3 refills | Status: DC

## 2018-06-13 MED ORDER — ALPRAZOLAM 0.5 MG PO TABS: 0.5000 mg | ORAL_TABLET | Freq: Three times a day (TID) | ORAL | 3 refills | Status: DC | PRN

## 2018-06-13 NOTE — Assessment & Plan Note (Signed)
Well controlled no changes 

## 2018-06-13 NOTE — Assessment & Plan Note (Signed)
Check Vitamin D, B12 levels  Higher risk for nutritional deficiencies

## 2018-06-13 NOTE — Assessment & Plan Note (Signed)
Discussed next step to add prozac or wellbutin She did not do well with lexapro If sleep an issue can add trazodone Wants to hold off at this time Continue xanax

## 2018-06-13 NOTE — Patient Instructions (Addendum)
F/u 6 months  for for Physical  We will call with lab results
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
## 2018-06-13 NOTE — Assessment & Plan Note (Signed)
Recheck CBC, on vitamins

## 2018-06-14 LAB — CBC WITH DIFFERENTIAL/PLATELET
Absolute Monocytes: 271 cells/uL (ref 200–950)
Basophils Absolute: 32 cells/uL (ref 0–200)
Basophils Relative: 0.7 %
EOS PCT: 1.3 %
Eosinophils Absolute: 60 cells/uL (ref 15–500)
HEMATOCRIT: 38 % (ref 35.0–45.0)
HEMOGLOBIN: 12.6 g/dL (ref 11.7–15.5)
LYMPHS ABS: 1523 {cells}/uL (ref 850–3900)
MCH: 28.6 pg (ref 27.0–33.0)
MCHC: 33.2 g/dL (ref 32.0–36.0)
MCV: 86.2 fL (ref 80.0–100.0)
MONOS PCT: 5.9 %
MPV: 9.7 fL (ref 7.5–12.5)
NEUTROS ABS: 2714 {cells}/uL (ref 1500–7800)
NEUTROS PCT: 59 %
Platelets: 258 10*3/uL (ref 140–400)
RBC: 4.41 10*6/uL (ref 3.80–5.10)
RDW: 12.9 % (ref 11.0–15.0)
Total Lymphocyte: 33.1 %
WBC: 4.6 10*3/uL (ref 3.8–10.8)

## 2018-06-14 LAB — LIPID PANEL
Cholesterol: 202 mg/dL — ABNORMAL HIGH (ref <200)
HDL: 72 mg/dL (ref >=50)
LDL Cholesterol (Calc): 113 mg/dL (calc) — ABNORMAL HIGH
Non-HDL Cholesterol (Calc): 130 mg/dL (calc) — ABNORMAL HIGH (ref <130)
Total CHOL/HDL Ratio: 2.8 (calc) (ref <5.0)
Triglycerides: 71 mg/dL (ref <150)

## 2018-06-14 LAB — COMPREHENSIVE METABOLIC PANEL
AG RATIO: 1.2 (calc) (ref 1.0–2.5)
ALKALINE PHOSPHATASE (APISO): 87 U/L (ref 31–125)
ALT: 20 U/L (ref 6–29)
AST: 20 U/L (ref 10–30)
Albumin: 3.9 g/dL (ref 3.6–5.1)
BILIRUBIN TOTAL: 0.4 mg/dL (ref 0.2–1.2)
BUN: 10 mg/dL (ref 7–25)
CALCIUM: 9.3 mg/dL (ref 8.6–10.2)
CO2: 29 mmol/L (ref 20–32)
Chloride: 103 mmol/L (ref 98–110)
Creat: 0.8 mg/dL (ref 0.50–1.10)
Globulin: 3.3 g/dL (calc) (ref 1.9–3.7)
Glucose, Bld: 86 mg/dL (ref 65–99)
Potassium: 3.8 mmol/L (ref 3.5–5.3)
SODIUM: 140 mmol/L (ref 135–146)
TOTAL PROTEIN: 7.2 g/dL (ref 6.1–8.1)

## 2018-06-14 LAB — TSH: TSH: 0.86 m[IU]/L

## 2018-06-14 LAB — VITAMIN B12: Vitamin B-12: 1138 pg/mL — ABNORMAL HIGH (ref 200–1100)

## 2018-06-14 LAB — VITAMIN D 25 HYDROXY (VIT D DEFICIENCY, FRACTURES): VIT D 25 HYDROXY: 43 ng/mL (ref 30–100)

## 2018-06-19 MED FILL — ALPRAZolam 0.5 MG TABS: 0.5 | 30 days supply | Qty: 90 | Fill #0 | Status: TO

## 2018-07-10 MED FILL — HYDROCHLOROTHIAZIDE 12.5 MG: 12.5 | 90 days supply | Qty: 90 | Fill #0

## 2018-07-17 MED FILL — ALPRAZolam 0.5 MG TABS: 0.5 | 30 days supply | Qty: 90 | Fill #0

## 2018-08-12 ENCOUNTER — Telehealth: Payer: 59 | Admitting: Physician Assistant

## 2018-08-12 DIAGNOSIS — B373 Candidiasis of vulva and vagina: Secondary | ICD-10-CM | POA: Diagnosis not present

## 2018-08-12 DIAGNOSIS — B3731 Acute candidiasis of vulva and vagina: Secondary | ICD-10-CM

## 2018-08-12 MED ORDER — FLUCONAZOLE 150 MG PO TABS: 150.0000 mg | ORAL_TABLET | Freq: Once | ORAL | 0 refills | Status: AC

## 2018-08-12 NOTE — Progress Notes (Signed)
I have spent 5 minutes in review of e-visit questionnaire, review and updating patient chart, medical decision making and response to patient.   Tomas Schamp Cody Itzabella Sorrels, PA-C    

## 2018-08-12 NOTE — Progress Notes (Signed)

## 2018-08-14 MED FILL — ALPRAZolam 0.5 MG TABS: 0.5 | 30 days supply | Qty: 90 | Fill #1

## 2018-08-29 ENCOUNTER — Encounter: Payer: Self-pay | Admitting: Family Medicine

## 2018-08-29 ENCOUNTER — Telehealth: Payer: Self-pay | Admitting: Family Medicine

## 2018-08-29 MED ORDER — ALPRAZOLAM 1 MG PO TABS: 1.0000 mg | ORAL_TABLET | Freq: Three times a day (TID) | ORAL | 3 refills | Status: DC | PRN

## 2018-08-29 NOTE — Telephone Encounter (Signed)
I dont recommend QID dosing Will change to xanax 1mg  TID prn, she can take 1/2 if anxiety isnt very high

## 2018-08-30 NOTE — Telephone Encounter (Signed)
Patient aware per MyChart.   

## 2018-09-25 MED FILL — HYDROCHLOROTHIAZIDE 12.5 MG: 12.5 | 90 days supply | Qty: 90 | Fill #1

## 2018-09-28 MED FILL — ALPRAZolam 1 MG TABS: 1 | 30 days supply | Qty: 90 | Fill #0

## 2018-10-26 MED FILL — ALPRAZolam 1 MG TABS: 1 | 30 days supply | Qty: 90 | Fill #1

## 2018-11-18 ENCOUNTER — Telehealth: Payer: 59 | Admitting: Physician Assistant

## 2018-11-18 DIAGNOSIS — T148XXA Other injury of unspecified body region, initial encounter: Secondary | ICD-10-CM

## 2018-11-18 DIAGNOSIS — T300 Burn of unspecified body region, unspecified degree: Secondary | ICD-10-CM

## 2018-11-18 NOTE — Progress Notes (Signed)
Based on what you shared with me, I feel your condition warrants further evaluation and I recommend that you be seen for a face to face office visit.  NOTE: If you entered your credit card information for this eVisit, you will not be charged. You may see a "hold" on your card for the $35 but that hold will drop off and you will not have a charge processed.  If you are having a true medical emergency please call 911.     For an urgent face to face visit, [facility] has five urgent care centers for your convenience:    [email or web address] to reserve your spot online an avoid wait times  [facility] (New Address!) [address], [address] [place], [place] [address] *Just off [address], across the road from [employer]* Modified hours of operation: Monday-Friday, 12 PM to 6 PM  Closed Saturday & Sunday   The following sites will take your insurance:  . [facility]    [phone]                  Get Driving Directions  [address] [place], [place] [address] . 10 am to 8 pm Monday-Friday . 12 pm to 8 pm Saturday-Sunday   . [facility]  [phone]                  Get Driving Directions  [address], [address] [place], [place] [address] . 8 am to 8 pm Monday-Friday . 9 am to 6 pm Saturday . 11 am to 6 pm Sunday   . [facility]  [phone]                  Get Driving Directions   [address] [place], [place] [address] . 8 am to 8 pm Monday-Friday . 8 am to 4 pm Saturday-Sunday    . [facility]                    Get Driving Directions  [phone]  [address] [place], [place] [address]  . Monday-Friday, 12 PM to 6 PM    Your e-visit answers were reviewed by a board certified advanced clinical practitioner to complete your personal care plan.  Thank you for using e-Visits. 

## 2018-11-20 ENCOUNTER — Other Ambulatory Visit: Payer: Self-pay

## 2018-11-21 ENCOUNTER — Encounter: Payer: Self-pay | Admitting: Family Medicine

## 2018-11-21 ENCOUNTER — Ambulatory Visit (INDEPENDENT_AMBULATORY_CARE_PROVIDER_SITE_OTHER): Payer: 59 | Admitting: Family Medicine

## 2018-11-21 VITALS — BP 128/62 | HR 74 | Temp 98.6°F | Resp 12 | Ht 65.0 in | Wt 205.0 lb

## 2018-11-21 DIAGNOSIS — T2101XA Burn of unspecified degree of chest wall, initial encounter: Secondary | ICD-10-CM | POA: Diagnosis not present

## 2018-11-21 DIAGNOSIS — T300 Burn of unspecified body region, unspecified degree: Secondary | ICD-10-CM

## 2018-11-21 MED ORDER — CEPHALEXIN 500 MG PO CAPS: 500.0000 mg | ORAL_CAPSULE | Freq: Two times a day (BID) | ORAL | 0 refills | Status: DC

## 2018-11-21 MED ORDER — FLUCONAZOLE 150 MG PO TABS: 150.0000 mg | ORAL_TABLET | Freq: Once | ORAL | 1 refills | Status: AC

## 2018-11-21 MED ORDER — MUPIROCIN 2 % EX OINT: 1.0000 "application " | TOPICAL_OINTMENT | Freq: Two times a day (BID) | CUTANEOUS | 0 refills | Status: DC

## 2018-11-21 NOTE — Patient Instructions (Signed)
F/u as needed
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
## 2018-11-27 MED FILL — ALPRAZolam 1 MG TABS: 1 | 30 days supply | Qty: 90 | Fill #2

## 2018-12-11 ENCOUNTER — Other Ambulatory Visit (HOSPITAL_COMMUNITY): Payer: Self-pay | Admitting: Family Medicine

## 2018-12-11 DIAGNOSIS — Z1231 Encounter for screening mammogram for malignant neoplasm of breast: Secondary | ICD-10-CM

## 2018-12-18 ENCOUNTER — Other Ambulatory Visit: Payer: Self-pay

## 2018-12-19 ENCOUNTER — Encounter: Payer: Self-pay | Admitting: Family Medicine

## 2018-12-19 ENCOUNTER — Ambulatory Visit (INDEPENDENT_AMBULATORY_CARE_PROVIDER_SITE_OTHER): Payer: 59 | Admitting: Family Medicine

## 2018-12-19 VITALS — BP 132/84 | HR 78 | Temp 98.4°F | Resp 14 | Ht 65.0 in | Wt 204.0 lb

## 2018-12-19 DIAGNOSIS — E559 Vitamin D deficiency, unspecified: Secondary | ICD-10-CM | POA: Diagnosis not present

## 2018-12-19 DIAGNOSIS — D509 Iron deficiency anemia, unspecified: Secondary | ICD-10-CM | POA: Diagnosis not present

## 2018-12-19 DIAGNOSIS — Z124 Encounter for screening for malignant neoplasm of cervix: Secondary | ICD-10-CM | POA: Diagnosis not present

## 2018-12-19 DIAGNOSIS — Z0000 Encounter for general adult medical examination without abnormal findings: Secondary | ICD-10-CM | POA: Diagnosis not present

## 2018-12-19 DIAGNOSIS — E785 Hyperlipidemia, unspecified: Secondary | ICD-10-CM

## 2018-12-19 DIAGNOSIS — I10 Essential (primary) hypertension: Secondary | ICD-10-CM

## 2018-12-19 DIAGNOSIS — Z0001 Encounter for general adult medical examination with abnormal findings: Secondary | ICD-10-CM | POA: Diagnosis not present

## 2018-12-19 MED ORDER — ALPRAZOLAM 1 MG PO TABS: 1.0000 mg | ORAL_TABLET | Freq: Three times a day (TID) | ORAL | 3 refills | Status: DC | PRN

## 2018-12-19 MED ORDER — HYDROCHLOROTHIAZIDE 12.5 MG PO TABS: 12.5000 mg | ORAL_TABLET | Freq: Every day | ORAL | 3 refills | Status: DC

## 2018-12-19 NOTE — Assessment & Plan Note (Signed)
Recheck lipids

## 2018-12-19 NOTE — Assessment & Plan Note (Signed)
Controlled no changes Check labs 
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
## 2018-12-19 NOTE — Patient Instructions (Signed)
F/U 6 months  I recommend eye visit once a year I recommend dental visit every 6 months Goal is to  Exercise 30 minutes 5 days a week We will call with lab results

## 2018-12-19 NOTE — Assessment & Plan Note (Signed)
History of iron def and B12 def S/p gastric sleeve  Continues with vitamin supplements

## 2018-12-20 LAB — VITAMIN D 25 HYDROXY (VIT D DEFICIENCY, FRACTURES): Vit D, 25-Hydroxy: 44 ng/mL (ref 30–100)

## 2018-12-20 LAB — CBC WITH DIFFERENTIAL/PLATELET
Absolute Monocytes: 310 cells/uL (ref 200–950)
Basophils Absolute: 20 cells/uL (ref 0–200)
Basophils Relative: 0.4 %
Eosinophils Absolute: 40 cells/uL (ref 15–500)
Eosinophils Relative: 0.8 %
HCT: 39.4 % (ref 35.0–45.0)
Hemoglobin: 12.7 g/dL (ref 11.7–15.5)
Lymphs Abs: 1575 cells/uL (ref 850–3900)
MCH: 28.4 pg (ref 27.0–33.0)
MCHC: 32.2 g/dL (ref 32.0–36.0)
MCV: 88.1 fL (ref 80.0–100.0)
MPV: 9.8 fL (ref 7.5–12.5)
Monocytes Relative: 6.2 %
Neutro Abs: 3055 cells/uL (ref 1500–7800)
Neutrophils Relative %: 61.1 %
Platelets: 265 10*3/uL (ref 140–400)
RBC: 4.47 10*6/uL (ref 3.80–5.10)
RDW: 12.9 % (ref 11.0–15.0)
Total Lymphocyte: 31.5 %
WBC: 5 10*3/uL (ref 3.8–10.8)

## 2018-12-20 LAB — PAP IG W/ RFLX HPV ASCU

## 2018-12-20 LAB — LIPID PANEL
Cholesterol: 214 mg/dL — ABNORMAL HIGH (ref <200)
HDL: 67 mg/dL (ref >=50)
LDL Cholesterol (Calc): 130 mg/dL (calc) — ABNORMAL HIGH
Non-HDL Cholesterol (Calc): 147 mg/dL (calc) — ABNORMAL HIGH (ref <130)
Total CHOL/HDL Ratio: 3.2 (calc) (ref <5.0)
Triglycerides: 78 mg/dL (ref <150)

## 2018-12-20 LAB — COMPREHENSIVE METABOLIC PANEL
AG Ratio: 1.3 (calc) (ref 1.0–2.5)
ALT: 13 U/L (ref 6–29)
AST: 17 U/L (ref 10–30)
Albumin: 4 g/dL (ref 3.6–5.1)
Alkaline phosphatase (APISO): 77 U/L (ref 31–125)
BUN: 12 mg/dL (ref 7–25)
CO2: 30 mmol/L (ref 20–32)
Calcium: 9.2 mg/dL (ref 8.6–10.2)
Chloride: 100 mmol/L (ref 98–110)
Creat: 0.81 mg/dL (ref 0.50–1.10)
Globulin: 3.1 g/dL (calc) (ref 1.9–3.7)
Glucose, Bld: 83 mg/dL (ref 65–99)
Potassium: 3.4 mmol/L — ABNORMAL LOW (ref 3.5–5.3)
Sodium: 138 mmol/L (ref 135–146)
Total Bilirubin: 0.5 mg/dL (ref 0.2–1.2)
Total Protein: 7.1 g/dL (ref 6.1–8.1)

## 2018-12-20 LAB — VITAMIN B12: Vitamin B-12: >2000 pg/mL — ABNORMAL HIGH (ref 200–1100)

## 2018-12-21 ENCOUNTER — Other Ambulatory Visit: Payer: Self-pay | Admitting: *Deleted

## 2018-12-21 MED ORDER — POTASSIUM CHLORIDE CRYS ER 20 MEQ PO TBCR: 20.0000 meq | EXTENDED_RELEASE_TABLET | Freq: Every day | ORAL | 0 refills | Status: DC

## 2018-12-21 MED FILL — HYDROCHLOROTHIAZIDE 12.5 MG: 12.5 | 90 days supply | Qty: 90 | Fill #0

## 2018-12-22 ENCOUNTER — Other Ambulatory Visit: Payer: Self-pay | Admitting: *Deleted

## 2018-12-22 MED ORDER — POTASSIUM CHLORIDE CRYS ER 20 MEQ PO TBCR: 20.0000 meq | EXTENDED_RELEASE_TABLET | Freq: Every day | ORAL | 0 refills | Status: DC

## 2018-12-22 MED FILL — POTASSIUM CHLORIDE CRYS ER: 20 | 3 days supply | Qty: 3 | Fill #0

## 2018-12-25 MED FILL — ALPRAZolam 1 MG TABS: 1 | 30 days supply | Qty: 90 | Fill #0

## 2019-01-18 ENCOUNTER — Encounter (HOSPITAL_COMMUNITY): Payer: Self-pay

## 2019-01-19 ENCOUNTER — Ambulatory Visit (HOSPITAL_COMMUNITY)
Admission: RE | Admit: 2019-01-19 | Discharge: 2019-01-19 | Disposition: A | Discharge status: Home / Self Care | Payer: 59 | Source: Ambulatory Visit | Attending: Family Medicine | Admitting: Family Medicine

## 2019-01-19 ENCOUNTER — Other Ambulatory Visit: Payer: Self-pay

## 2019-01-19 DIAGNOSIS — Z0100 Encounter for examination of eyes and vision without abnormal findings: Secondary | ICD-10-CM | POA: Diagnosis not present

## 2019-01-19 DIAGNOSIS — Z1231 Encounter for screening mammogram for malignant neoplasm of breast: Secondary | ICD-10-CM | POA: Diagnosis not present

## 2019-02-20 MED FILL — ALPRAZolam 1 MG TABS: 1 | 30 days supply | Qty: 90 | Fill #2

## 2019-03-20 MED FILL — ALPRAZolam 1 MG TABS: 1 | 30 days supply | Qty: 90 | Fill #3

## 2019-04-05 ENCOUNTER — Encounter: Payer: Self-pay | Admitting: Family Medicine

## 2019-04-06 MED ORDER — ALPRAZOLAM 1 MG PO TABS: 1.0000 mg | ORAL_TABLET | Freq: Three times a day (TID) | ORAL | 3 refills | Status: DC | PRN

## 2019-04-06 NOTE — Telephone Encounter (Signed)
Ok to refill??  Last office visit/ refill 12/19/2018, #3 refills.

## 2019-04-09 ENCOUNTER — Encounter: Payer: Self-pay | Admitting: Family Medicine

## 2019-04-09 ENCOUNTER — Other Ambulatory Visit: Payer: Self-pay | Admitting: Family Medicine

## 2019-04-09 MED ORDER — ALPRAZOLAM 1 MG PO TABS: 1.0000 mg | ORAL_TABLET | Freq: Three times a day (TID) | ORAL | 3 refills | Status: DC | PRN

## 2019-04-09 MED FILL — HYDROCHLOROTHIAZIDE 12.5 MG: 12.5 | 90 days supply | Qty: 90 | Fill #2

## 2019-04-09 NOTE — Telephone Encounter (Signed)
Ok to refill to correct pharmacy?

## 2019-04-17 MED FILL — ALPRAZolam 1 MG TABS: 1 | 30 days supply | Qty: 90 | Fill #0

## 2019-05-17 MED FILL — ALPRAZolam 1 MG TABS: 1 | 30 days supply | Qty: 90 | Fill #1

## 2019-06-18 MED FILL — ALPRAZolam 1 MG TABS: 1 | 30 days supply | Qty: 90 | Fill #2

## 2019-06-22 ENCOUNTER — Encounter: Payer: Self-pay | Admitting: Family Medicine

## 2019-06-22 ENCOUNTER — Other Ambulatory Visit: Payer: Self-pay

## 2019-06-22 ENCOUNTER — Ambulatory Visit (INDEPENDENT_AMBULATORY_CARE_PROVIDER_SITE_OTHER): Payer: 59 | Admitting: Family Medicine

## 2019-06-22 VITALS — BP 120/74 | HR 84 | Temp 98.1°F | Resp 14 | Ht 65.0 in | Wt 216.0 lb

## 2019-06-22 DIAGNOSIS — E669 Obesity, unspecified: Secondary | ICD-10-CM | POA: Diagnosis not present

## 2019-06-22 DIAGNOSIS — F419 Anxiety disorder, unspecified: Secondary | ICD-10-CM

## 2019-06-22 DIAGNOSIS — I10 Essential (primary) hypertension: Secondary | ICD-10-CM | POA: Diagnosis not present

## 2019-06-22 DIAGNOSIS — E785 Hyperlipidemia, unspecified: Secondary | ICD-10-CM | POA: Diagnosis not present

## 2019-06-22 DIAGNOSIS — Z9884 Bariatric surgery status: Secondary | ICD-10-CM

## 2019-06-22 DIAGNOSIS — E66812 Obesity, class 2: Secondary | ICD-10-CM

## 2019-06-22 NOTE — Patient Instructions (Addendum)
F/U 6 months for Physical  

## 2019-06-22 NOTE — Progress Notes (Signed)
   Subjective:    Patient ID: Michelle Wilson, female    DOB: 06/10/1975, 44 y.o.   MRN: [id number]  Patient presents for Follow-up (is fasting) Here to follow-up chronic medical problems.  Medications reviewed.  Hypertension she is still taking hydrochlorothiazide 12.5 mg daily without any side effects.  Generalized anxiety taking alprazolam up to 3 times a day as needed  Bariatric weight loss surgery.  She is currently on supplements.  Her B12 level was very high so advised her to reduce her oral med recheck this today.  Hyperlipidemia  controlling with diet last LDL 136 months ago total cholesterol 214  60grams protein, low carb  Review Of Systems:  GEN- denies fatigue, fever, weight loss,weakness, recent illness HEENT- denies eye drainage, change in vision, nasal discharge, CVS- denies chest pain, palpitations RESP- denies SOB, cough, wheeze ABD- denies N/V, change in stools, abd pain GU- denies dysuria, hematuria, dribbling, incontinence MSK- denies joint pain, muscle aches, injury Neuro- denies headache, dizziness, syncope, seizure activity       Objective:    BP 120/74   Pulse 84   Temp 98.1 F (36.7 C) (Temporal)   Resp 14   Ht 5\' 5"  (1.651 m)   Wt 216 lb (98 kg)   LMP 05/29/2019 Comment: irregular  SpO2 98%   BMI 35.94 kg/m  GEN- NAD, alert and oriented x3 HEENT- PERRL, EOMI, non injected sclera, pink conjunctiva, MMM, oropharynx clear Neck- Supple, no thyromegaly CVS- RRR, no murmur RESP-CTAB ABD-NABS,soft,NT,ND Psych- normal affect and mood EXT- No edema Pulses- Radial, DP- 2+        Assessment & Plan:      Problem List Items Addressed This Visit      Unprioritized   Anxiety    Prn benzo, no changes at this time She does have a very stressful job      Class 2 obesity    Discussed some dietary changes Will go back to meal prepping a few days  A week to cut down on fastfood Restart physical activity which was also a stress reliever for  her      Essential hypertension, benign - Primary    Well controlled no changes Fasting labs today  Continue HCTZ      Relevant Orders   CBC with Differential/Platelet (Completed)   Comprehensive metabolic panel (Completed)   Mild hyperlipidemia   Relevant Orders   Lipid panel (Completed)   S/P laparoscopic sleeve gastrectomy Dec 2017   Relevant Orders   Vitamin B12 (Completed)      Note: This dictation was prepared with Dragon dictation along with smaller phrase technology. Any transcriptional errors that result from this process are unintentional.

## 2019-06-23 LAB — COMPREHENSIVE METABOLIC PANEL
AG Ratio: 1.2 (calc) (ref 1.0–2.5)
ALT: 24 U/L (ref 6–29)
AST: 24 U/L (ref 10–30)
Albumin: 4 g/dL (ref 3.6–5.1)
Alkaline phosphatase (APISO): 102 U/L (ref 31–125)
BUN: 13 mg/dL (ref 7–25)
CO2: 27 mmol/L (ref 20–32)
Calcium: 9.6 mg/dL (ref 8.6–10.2)
Chloride: 101 mmol/L (ref 98–110)
Creat: 0.83 mg/dL (ref 0.50–1.10)
Globulin: 3.3 g/dL (calc) (ref 1.9–3.7)
Glucose, Bld: 83 mg/dL (ref 65–99)
Potassium: 3.9 mmol/L (ref 3.5–5.3)
Sodium: 141 mmol/L (ref 135–146)
Total Bilirubin: 0.5 mg/dL (ref 0.2–1.2)
Total Protein: 7.3 g/dL (ref 6.1–8.1)

## 2019-06-23 LAB — LIPID PANEL
Cholesterol: 231 mg/dL — ABNORMAL HIGH (ref <200)
HDL: 74 mg/dL (ref >=50)
LDL Cholesterol (Calc): 143 mg/dL (calc) — ABNORMAL HIGH
Non-HDL Cholesterol (Calc): 157 mg/dL (calc) — ABNORMAL HIGH (ref <130)
Total CHOL/HDL Ratio: 3.1 (calc) (ref <5.0)
Triglycerides: 51 mg/dL (ref <150)

## 2019-06-23 LAB — CBC WITH DIFFERENTIAL/PLATELET
Absolute Monocytes: 322 cells/uL (ref 200–950)
Basophils Absolute: 29 cells/uL (ref 0–200)
Basophils Relative: 0.6 %
Eosinophils Absolute: 91 cells/uL (ref 15–500)
Eosinophils Relative: 1.9 %
HCT: 37.9 % (ref 35.0–45.0)
Hemoglobin: 12.4 g/dL (ref 11.7–15.5)
Lymphs Abs: 1382 cells/uL (ref 850–3900)
MCH: 28.1 pg (ref 27.0–33.0)
MCHC: 32.7 g/dL (ref 32.0–36.0)
MCV: 85.9 fL (ref 80.0–100.0)
MPV: 9.8 fL (ref 7.5–12.5)
Monocytes Relative: 6.7 %
Neutro Abs: 2976 cells/uL (ref 1500–7800)
Neutrophils Relative %: 62 %
Platelets: 242 10*3/uL (ref 140–400)
RBC: 4.41 10*6/uL (ref 3.80–5.10)
RDW: 13.3 % (ref 11.0–15.0)
Total Lymphocyte: 28.8 %
WBC: 4.8 10*3/uL (ref 3.8–10.8)

## 2019-06-23 LAB — VITAMIN B12: Vitamin B-12: 764 pg/mL (ref 200–1100)

## 2019-06-24 ENCOUNTER — Encounter: Payer: Self-pay | Admitting: Family Medicine

## 2019-06-24 NOTE — Assessment & Plan Note (Signed)
Well controlled no changes Fasting labs today  Continue HCTZ

## 2019-06-24 NOTE — Assessment & Plan Note (Signed)
Prn benzo, no changes at this time She does have a very stressful job

## 2019-06-24 NOTE — Assessment & Plan Note (Signed)
Discussed some dietary changes Will go back to meal prepping a few days  A week to cut down on fastfood Restart physical activity which was also a stress reliever for her

## 2019-07-09 MED FILL — HYDROCHLOROTHIAZIDE 12.5 MG: 12.5 | 90 days supply | Qty: 90 | Fill #1

## 2019-07-16 MED FILL — ALPRAZolam 1 MG TABS: 1 | 30 days supply | Qty: 90 | Fill #3

## 2019-08-20 ENCOUNTER — Other Ambulatory Visit: Payer: Self-pay | Admitting: Family Medicine

## 2019-08-20 MED FILL — ALPRAZolam 1 MG TABS: 1 | 30 days supply | Qty: 90 | Fill #0

## 2019-08-20 NOTE — Telephone Encounter (Signed)
Ok to refill??  Last office visit 06/22/2019.  Last refill 04/09/2019, #3 refills.

## 2019-09-18 MED FILL — ALPRAZolam 1 MG TABS: 1 | 30 days supply | Qty: 90 | Fill #1

## 2019-10-09 MED FILL — HYDROCHLOROTHIAZIDE 12.5 MG: 12.5 | 90 days supply | Qty: 90 | Fill #2

## 2019-10-22 MED FILL — ALPRAZolam 1 MG TABS: 1 | 30 days supply | Qty: 90 | Fill #2

## 2019-10-27 ENCOUNTER — Telehealth: Payer: 59 | Admitting: Emergency Medicine

## 2019-10-27 DIAGNOSIS — N760 Acute vaginitis: Secondary | ICD-10-CM | POA: Diagnosis not present

## 2019-10-27 MED ORDER — FLUCONAZOLE 150 MG PO TABS: 150.0000 mg | ORAL_TABLET | Freq: Once | ORAL | 0 refills | Status: AC

## 2019-10-27 NOTE — Progress Notes (Signed)
We are sorry that you are not feeling well. Here is how we plan to help! Based on what you shared with me it looks like you: May have a yeast vaginosis  Vaginosis is an inflammation of the vagina that can result in discharge, itching and pain. The cause is usually a change in the normal balance of vaginal bacteria or an infection. Vaginosis can also result from reduced estrogen levels after menopause.  The most common causes of vaginosis are:   Bacterial vaginosis which results from an overgrowth of one on several organisms that are normally present in your vagina.   Yeast infections which are caused by a naturally occurring fungus called candida.   Vaginal atrophy (atrophic vaginosis) which results from the thinning of the vagina from reduced estrogen levels after menopause.   Trichomoniasis which is caused by a parasite and is commonly transmitted by sexual intercourse.  Factors that increase your risk of developing vaginosis include: . Medications, such as antibiotics and steroids . Uncontrolled diabetes . Use of hygiene products such as bubble bath, vaginal spray or vaginal deodorant . Douching . Wearing damp or tight-fitting clothing . Using an intrauterine device (IUD) for birth control . Hormonal changes, such as those associated with pregnancy, birth control pills or menopause . Sexual activity . Having a sexually transmitted infection  Your treatment plan is A single Diflucan (fluconazole) 150mg tablet once.  I have electronically sent this prescription into the pharmacy that you have chosen.  Be sure to take all of the medication as directed. Stop taking any medication if you develop a rash, tongue swelling or shortness of breath. Mothers who are breast feeding should consider pumping and discarding their breast milk while on these antibiotics. However, there is no consensus that infant exposure at these doses would be harmful.  Remember that medication creams can weaken latex  condoms. .   HOME CARE:  Good hygiene may prevent some types of vaginosis from recurring and may relieve some symptoms:  . Avoid baths, hot tubs and whirlpool spas. Rinse soap from your outer genital area after a shower, and dry the area well to prevent irritation. Don't use scented or harsh soaps, such as those with deodorant or antibacterial action. . Avoid irritants. These include scented tampons and pads. . Wipe from front to back after using the toilet. Doing so avoids spreading fecal bacteria to your vagina.  Other things that may help prevent vaginosis include:  . Don't douche. Your vagina doesn't require cleansing other than normal bathing. Repetitive douching disrupts the normal organisms that reside in the vagina and can actually increase your risk of vaginal infection. Douching won't clear up a vaginal infection. . Use a latex condom. Both female and female latex condoms may help you avoid infections spread by sexual contact. . Wear cotton underwear. Also wear pantyhose with a cotton crotch. If you feel comfortable without it, skip wearing underwear to bed. Yeast thrives in moist environments Your symptoms should improve in the next day or two.  GET HELP RIGHT AWAY IF:  . You have pain in your lower abdomen ( pelvic area or over your ovaries) . You develop nausea or vomiting . You develop a fever . Your discharge changes or worsens . You have persistent pain with intercourse . You develop shortness of breath, a rapid pulse, or you faint.  These symptoms could be signs of problems or infections that need to be evaluated by a medical provider now.  MAKE SURE YOU      Understand these instructions.  Will watch your condition.  Will get help right away if you are not doing well or get worse.  Your e-visit answers were reviewed by a board certified advanced clinical practitioner to complete your personal care plan. Depending upon the condition, your plan could have included  both over the counter or prescription medications. Please review your pharmacy choice to make sure that you have choses a pharmacy that is open for you to pick up any needed prescription, Your safety is important to us. If you have drug allergies check your prescription carefully.   You can use MyChart to ask questions about today's visit, request a non-urgent call back, or ask for a work or school excuse for 24 hours related to this e-Visit. If it has been greater than 24 hours you will need to follow up with your provider, or enter a new e-Visit to address those concerns. You will get a MyChart message within the next two days asking about your experience. I hope that your e-visit has been valuable and will speed your recovery.  **Please do not respond to this message unless you have follow up questions.** Greater than 5 but less than 10 minutes spent researching, coordinating, and implementing care for this patient today  

## 2019-11-08 ENCOUNTER — Encounter (HOSPITAL_COMMUNITY): Payer: Self-pay

## 2019-11-19 MED FILL — ALPRAZOLAM 1 MG TABS: 1 | 30 days supply | Qty: 90 | Fill #3

## 2019-12-06 ENCOUNTER — Other Ambulatory Visit (HOSPITAL_COMMUNITY): Payer: Self-pay | Admitting: Family Medicine

## 2019-12-06 DIAGNOSIS — Z1231 Encounter for screening mammogram for malignant neoplasm of breast: Secondary | ICD-10-CM

## 2019-12-21 ENCOUNTER — Other Ambulatory Visit: Payer: Self-pay | Admitting: Family Medicine

## 2019-12-21 ENCOUNTER — Ambulatory Visit: Payer: 59 | Admitting: Family Medicine

## 2019-12-21 ENCOUNTER — Other Ambulatory Visit: Payer: Self-pay

## 2019-12-21 ENCOUNTER — Encounter: Payer: Self-pay | Admitting: Family Medicine

## 2019-12-21 VITALS — BP 128/82 | HR 88 | Temp 98.0°F | Resp 14 | Ht 65.0 in | Wt 214.0 lb

## 2019-12-21 DIAGNOSIS — F419 Anxiety disorder, unspecified: Secondary | ICD-10-CM

## 2019-12-21 DIAGNOSIS — Z9884 Bariatric surgery status: Secondary | ICD-10-CM | POA: Diagnosis not present

## 2019-12-21 DIAGNOSIS — I10 Essential (primary) hypertension: Secondary | ICD-10-CM | POA: Diagnosis not present

## 2019-12-21 DIAGNOSIS — Z0001 Encounter for general adult medical examination with abnormal findings: Secondary | ICD-10-CM | POA: Diagnosis not present

## 2019-12-21 DIAGNOSIS — D509 Iron deficiency anemia, unspecified: Secondary | ICD-10-CM

## 2019-12-21 DIAGNOSIS — Z0000 Encounter for general adult medical examination without abnormal findings: Secondary | ICD-10-CM

## 2019-12-21 DIAGNOSIS — E669 Obesity, unspecified: Secondary | ICD-10-CM | POA: Diagnosis not present

## 2019-12-21 DIAGNOSIS — Z1159 Encounter for screening for other viral diseases: Secondary | ICD-10-CM

## 2019-12-21 DIAGNOSIS — E559 Vitamin D deficiency, unspecified: Secondary | ICD-10-CM | POA: Diagnosis not present

## 2019-12-21 DIAGNOSIS — E785 Hyperlipidemia, unspecified: Secondary | ICD-10-CM

## 2019-12-21 MED ORDER — ALPRAZOLAM 1 MG PO TABS: ORAL_TABLET | ORAL | 3 refills | Status: DC

## 2019-12-21 MED ORDER — HYDROCHLOROTHIAZIDE 12.5 MG PO TABS: 12.5000 mg | ORAL_TABLET | Freq: Every day | ORAL | 3 refills | Status: DC

## 2019-12-21 MED FILL — ALPRAZolam 1 MG TABS: 1 | 30 days supply | Qty: 90 | Fill #0

## 2019-12-21 MED FILL — HYDROCHLOROTHIAZIDE 12.5 MG: 12.5 | 90 days supply | Qty: 90 | Fill #0

## 2019-12-21 NOTE — Patient Instructions (Signed)
F/U 6 months

## 2019-12-21 NOTE — Assessment & Plan Note (Signed)
No change in medication.  Continue alprazolam. SHe is taking as prescribed.

## 2019-12-21 NOTE — Assessment & Plan Note (Signed)
We will check lipid panel today along with her other fasting labs.  She has been working on dietary changes to help control her cholesterol.  Increasing her protein.  Trying to get in some physical activity

## 2019-12-21 NOTE — Progress Notes (Signed)
   Subjective:    Patient ID: Michelle Wilson, female    DOB: [birth year], 44 y.o.   MRN: [id number]  Patient presents for Annual Exam (is fasting)  Pt here for CPE   Medications reviewed.   PAP Smear UTD Mammogram UTD   HTN- taking HCTZ as prescribed  Mild Hyperlipidemia-  Treating with diet, last LDL 143  TC 231   Anemia s/p gastric sleeve , taking B12   Immunizations- UTD  Discussed Hep C screening    Anxiety- uses xanax as needed     Review Of Systems:  GEN- denies fatigue, fever, weight loss,weakness, recent illness HEENT- denies eye drainage, change in vision, nasal discharge, CVS- denies chest pain, palpitations RESP- denies SOB, cough, wheeze ABD- denies N/V, change in stools, abd pain GU- denies dysuria, hematuria, dribbling, incontinence MSK- denies joint pain, muscle aches, injury Neuro- denies headache, dizziness, syncope, seizure activity       Objective:    BP 128/82   Pulse 88   Temp 98 F (36.7 C) (Temporal)   Resp 14   Ht 5\' 5"  (1.651 m)   Wt 214 lb (97.1 kg)   LMP 12/07/2019 Comment: irregular  SpO2 98%   BMI 35.61 kg/m  GEN- NAD, alert and oriented x3 HEENT- PERRL, EOMI, non injected sclera, pink conjunctiva, MMM, oropharynx clear Neck- Supple, no thyromegaly CVS- RRR, no murmur RESP-CTAB ABD-NABS,soft,NT,ND EXT- No edema Pulses- Radial, DP- 2+        Assessment & Plan:      Problem List Items Addressed This Visit      Unprioritized   Anemia   Anxiety    No change in medication.  Continue alprazolam. SHe is taking as prescribed.      Relevant Medications   ALPRAZolam (XANAX) 1 MG tablet   Class 2 obesity    We will check lipid panel today along with her other fasting labs.  She has been working on dietary changes to help control her cholesterol.  Increasing her protein.  Trying to get in some physical activity      Essential hypertension, benign    Blood pressure is controlled no change in medication.       Relevant Medications   hydrochlorothiazide (HYDRODIURIL) 12.5 MG tablet   Mild hyperlipidemia   Relevant Medications   hydrochlorothiazide (HYDRODIURIL) 12.5 MG tablet   Other Relevant Orders   Lipid panel   S/P laparoscopic sleeve gastrectomy Dec 2017    Other Visit Diagnoses    Routine general medical examination at a health care facility    -  Primary   CPE done, fasting labs, immunizations UTD   Relevant Orders   CBC with Differential/Platelet   Comprehensive metabolic panel   Lipid panel   TSH   Vitamin D deficiency       Relevant Orders   Vitamin D, 25-hydroxy   Need for hepatitis C screening test       Relevant Orders   Hepatitis C antibody      Note: This dictation was prepared with Dragon dictation along with smaller phrase technology. Any transcriptional errors that result from this process are unintentional.

## 2019-12-21 NOTE — Assessment & Plan Note (Signed)
Blood pressure is controlled no change in medication. 

## 2019-12-24 LAB — COMPREHENSIVE METABOLIC PANEL
AG Ratio: 1.3 (calc) (ref 1.0–2.5)
ALT: 11 U/L (ref 6–29)
AST: 14 U/L (ref 10–30)
Albumin: 3.9 g/dL (ref 3.6–5.1)
Alkaline phosphatase (APISO): 95 U/L (ref 31–125)
BUN: 11 mg/dL (ref 7–25)
CO2: 27 mmol/L (ref 20–32)
Calcium: 9.1 mg/dL (ref 8.6–10.2)
Chloride: 102 mmol/L (ref 98–110)
Creat: 0.74 mg/dL (ref 0.50–1.10)
Globulin: 2.9 g/dL (calc) (ref 1.9–3.7)
Glucose, Bld: 87 mg/dL (ref 65–99)
Potassium: 3.4 mmol/L — ABNORMAL LOW (ref 3.5–5.3)
Sodium: 140 mmol/L (ref 135–146)
Total Bilirubin: 0.4 mg/dL (ref 0.2–1.2)
Total Protein: 6.8 g/dL (ref 6.1–8.1)

## 2019-12-24 LAB — CBC WITH DIFFERENTIAL/PLATELET
Absolute Monocytes: 350 cells/uL (ref 200–950)
Basophils Absolute: 29 cells/uL (ref 0–200)
Basophils Relative: 0.6 %
Eosinophils Absolute: 91 cells/uL (ref 15–500)
Eosinophils Relative: 1.9 %
HCT: 36.3 % (ref 35.0–45.0)
Hemoglobin: 11.7 g/dL (ref 11.7–15.5)
Lymphs Abs: 1382 cells/uL (ref 850–3900)
MCH: 26.8 pg — ABNORMAL LOW (ref 27.0–33.0)
MCHC: 32.2 g/dL (ref 32.0–36.0)
MCV: 83.3 fL (ref 80.0–100.0)
MPV: 9.9 fL (ref 7.5–12.5)
Monocytes Relative: 7.3 %
Neutro Abs: 2947 cells/uL (ref 1500–7800)
Neutrophils Relative %: 61.4 %
Platelets: 262 10*3/uL (ref 140–400)
RBC: 4.36 10*6/uL (ref 3.80–5.10)
RDW: 14.1 % (ref 11.0–15.0)
Total Lymphocyte: 28.8 %
WBC: 4.8 10*3/uL (ref 3.8–10.8)

## 2019-12-24 LAB — LIPID PANEL
Cholesterol: 210 mg/dL — ABNORMAL HIGH (ref <200)
HDL: 65 mg/dL (ref >=50)
LDL Cholesterol (Calc): 130 mg/dL (calc) — ABNORMAL HIGH
Non-HDL Cholesterol (Calc): 145 mg/dL (calc) — ABNORMAL HIGH (ref <130)
Total CHOL/HDL Ratio: 3.2 (calc) (ref <5.0)
Triglycerides: 61 mg/dL (ref <150)

## 2019-12-24 LAB — VITAMIN D 25 HYDROXY (VIT D DEFICIENCY, FRACTURES): Vit D, 25-Hydroxy: 28 ng/mL — ABNORMAL LOW (ref 30–100)

## 2019-12-24 LAB — HEPATITIS C ANTIBODY
Hepatitis C Ab: NONREACTIVE
SIGNAL TO CUT-OFF: 0.02 (ref <1.00)

## 2019-12-24 LAB — TSH: TSH: 0.55 mIU/L

## 2019-12-27 ENCOUNTER — Other Ambulatory Visit: Payer: Self-pay

## 2019-12-27 MED ORDER — POTASSIUM CHLORIDE CRYS ER 20 MEQ PO TBCR: 20.0000 meq | EXTENDED_RELEASE_TABLET | Freq: Every day | ORAL | 0 refills | Status: DC

## 2019-12-27 MED FILL — POTASSIUM CHLORIDE CRYS ER: 20 | 3 days supply | Qty: 3 | Fill #0

## 2020-01-18 ENCOUNTER — Other Ambulatory Visit: Payer: Self-pay

## 2020-01-18 ENCOUNTER — Ambulatory Visit (HOSPITAL_COMMUNITY)
Admission: RE | Admit: 2020-01-18 | Discharge: 2020-01-18 | Disposition: A | Discharge status: Home / Self Care | Payer: 59 | Source: Ambulatory Visit | Attending: Family Medicine | Admitting: Family Medicine

## 2020-01-18 DIAGNOSIS — Z1231 Encounter for screening mammogram for malignant neoplasm of breast: Secondary | ICD-10-CM | POA: Diagnosis not present

## 2020-01-18 MED FILL — ALPRAZolam 1 MG TABS: 1 | 30 days supply | Qty: 90 | Fill #1

## 2020-02-15 MED FILL — ALPRAZolam 1 MG TABS: 1 | 30 days supply | Qty: 90 | Fill #2

## 2020-02-18 DIAGNOSIS — Z0100 Encounter for examination of eyes and vision without abnormal findings: Secondary | ICD-10-CM | POA: Diagnosis not present

## 2020-03-17 MED FILL — ALPRAZolam 1 MG TABS: 1 | 30 days supply | Qty: 90 | Fill #3

## 2020-03-31 MED FILL — HYDROCHLOROTHIAZIDE 12.5 MG: 12.5 | 90 days supply | Qty: 90 | Fill #1

## 2020-04-10 ENCOUNTER — Ambulatory Visit: Payer: 59 | Attending: Internal Medicine

## 2020-04-10 DIAGNOSIS — Z23 Encounter for immunization: Secondary | ICD-10-CM

## 2020-04-10 NOTE — Progress Notes (Signed)
   Covid-19 Vaccination [facility]  Name:  Michelle Wilson    MRN: [id number] DOB: [birth year]  04/10/2020  Ms. Michelle Wilson was observed post Covid-19 immunization for 15 minutes without incident. She was provided with Vaccine Information Sheet and instruction to access the V-Safe system.   Ms. Michelle Wilson was instructed to call 911 with any severe reactions post vaccine: Marland Kitchen Difficulty breathing  . Swelling of face and throat  . A fast heartbeat  . A bad rash all over body  . Dizziness and weakness   Immunizations Administered    No immunizations on file.

## 2020-04-11 ENCOUNTER — Encounter: Payer: Self-pay | Admitting: Family Medicine

## 2020-04-14 ENCOUNTER — Other Ambulatory Visit: Payer: Self-pay | Admitting: Family Medicine

## 2020-04-14 MED ORDER — ALPRAZOLAM 1 MG PO TABS: ORAL_TABLET | ORAL | 3 refills | Status: DC

## 2020-04-14 NOTE — Telephone Encounter (Signed)
Ok to refill??  Last office visit/  refill 12/21/2019, #3 refills.

## 2020-04-15 ENCOUNTER — Ambulatory Visit (INDEPENDENT_AMBULATORY_CARE_PROVIDER_SITE_OTHER): Payer: 59 | Admitting: Family Medicine

## 2020-04-15 ENCOUNTER — Ambulatory Visit: Payer: 59 | Admitting: Family Medicine

## 2020-04-15 ENCOUNTER — Other Ambulatory Visit: Payer: Self-pay

## 2020-04-15 ENCOUNTER — Encounter: Payer: Self-pay | Admitting: Family Medicine

## 2020-04-15 VITALS — BP 112/64 | HR 78 | Temp 98.2°F | Resp 14 | Ht 65.0 in | Wt 214.0 lb

## 2020-04-15 DIAGNOSIS — Z124 Encounter for screening for malignant neoplasm of cervix: Secondary | ICD-10-CM | POA: Diagnosis not present

## 2020-04-15 DIAGNOSIS — Z01419 Encounter for gynecological examination (general) (routine) without abnormal findings: Secondary | ICD-10-CM | POA: Diagnosis not present

## 2020-04-15 NOTE — Progress Notes (Signed)
   Subjective:    Patient ID: Michelle Wilson, female    DOB: 03-07-1976, 44 y.o.   MRN: [id number]  Patient presents for Gynecologic Exam  Pt here for PAP Smear  Last PAP  2020 normal she gets done yearly because of GYN cancer in her family.  Seen by eye doctor in Oct  Menses just ended   Her meds have been updated.  Mammogram UTD  Review Of Systems:  GEN- denies fatigue, fever, weight loss,weakness, recent illness HEENT- denies eye drainage, change in vision, nasal discharge, CVS- denies chest pain, palpitations RESP- denies SOB, cough, wheeze ABD- denies N/V, change in stools, abd pain GU- denies dysuria, hematuria, dribbling, incontinence MSK- denies joint pain, muscle aches, injury Neuro- denies headache, dizziness, syncope, seizure activity       Objective:    BP 112/64   Pulse 78   Temp 98.2 F (36.8 C) (Temporal)   Resp 14   Ht 5\' 5"  (1.651 m)   Wt 214 lb (97.1 kg)   SpO2 100%   BMI 35.61 kg/m  GEN- NAD, alert and oriented x3 HEENT- PERRL, EOMI, non injected sclera, pink conjunctiva, MMM, oropharynx clear Neck- Supple, no thyromegaly Breast- normal symmetry, no nipple inversion,no nipple drainage, no nodules or lumps felt Nodes- no axillary nodes CVS- RRR, no murmur RESP-CTAB ABD-NABS,soft,NT,ND GU- normal external genitalia, vaginal mucosa pink and moist, cervix visualized no growth, mild blood form os, no discharge, no CMT, no ovarian masses, uterus normal size EXT- No edema Pulses- Radial, DP- 2+        Assessment & Plan:      Problem List Items Addressed This Visit   None   Visit Diagnoses    Encounter for gynecological examination without abnormal finding    -  Primary   Mammogram UTD, immunizations UTD, PAP Smear with HPV testing done   Cervical cancer screening       Relevant Orders   PAP,TP IMGw/HPV RNA,rflx      Note: This dictation was prepared with Dragon dictation along with smaller phrase technology. Any  transcriptional errors that result from this process are unintentional.

## 2020-04-15 NOTE — Patient Instructions (Signed)
F/U 4 months with [name]  

## 2020-04-17 LAB — PAP, TP IMAGING W/ HPV RNA, RFLX HPV TYPE 16,18/45: HPV DNA High Risk: NOT DETECTED

## 2020-05-13 MED FILL — ALPRAZolam 1 MG TABS: 1 | 30 days supply | Qty: 90 | Fill #0

## 2020-06-04 ENCOUNTER — Encounter: Payer: Self-pay | Admitting: Family Medicine

## 2020-06-04 ENCOUNTER — Other Ambulatory Visit: Payer: Self-pay | Admitting: Family Medicine

## 2020-06-04 MED ORDER — BUSPIRONE HCL 7.5 MG PO TABS: 7.5000 mg | ORAL_TABLET | Freq: Two times a day (BID) | ORAL | 2 refills | Status: DC

## 2020-06-04 MED FILL — busPIRone HCL 7.5 MG TABS: 7.5 | 30 days supply | Qty: 60 | Fill #0

## 2020-06-04 NOTE — Addendum Note (Signed)
Addended by: Milinda Antis F on: 06/04/2020 02:24 PM   Modules accepted: Orders

## 2020-06-12 MED FILL — ALPRAZolam 1 MG TABS: 1 | 30 days supply | Qty: 90 | Fill #1

## 2020-06-20 ENCOUNTER — Ambulatory Visit: Payer: 59 | Admitting: Family Medicine

## 2020-06-23 ENCOUNTER — Ambulatory Visit: Payer: 59 | Admitting: Nurse Practitioner

## 2020-06-23 ENCOUNTER — Encounter: Payer: Self-pay | Admitting: Nurse Practitioner

## 2020-06-23 ENCOUNTER — Other Ambulatory Visit: Payer: Self-pay | Admitting: Nurse Practitioner

## 2020-06-23 ENCOUNTER — Other Ambulatory Visit: Payer: Self-pay

## 2020-06-23 VITALS — BP 128/80 | HR 93 | Temp 99.5°F | Ht 65.0 in | Wt 214.8 lb

## 2020-06-23 DIAGNOSIS — D509 Iron deficiency anemia, unspecified: Secondary | ICD-10-CM | POA: Diagnosis not present

## 2020-06-23 DIAGNOSIS — N951 Menopausal and female climacteric states: Secondary | ICD-10-CM | POA: Diagnosis not present

## 2020-06-23 DIAGNOSIS — E785 Hyperlipidemia, unspecified: Secondary | ICD-10-CM | POA: Diagnosis not present

## 2020-06-23 DIAGNOSIS — I10 Essential (primary) hypertension: Secondary | ICD-10-CM

## 2020-06-23 DIAGNOSIS — F419 Anxiety disorder, unspecified: Secondary | ICD-10-CM

## 2020-06-23 DIAGNOSIS — E669 Obesity, unspecified: Secondary | ICD-10-CM | POA: Diagnosis not present

## 2020-06-23 DIAGNOSIS — E559 Vitamin D deficiency, unspecified: Secondary | ICD-10-CM | POA: Diagnosis not present

## 2020-06-23 DIAGNOSIS — Z6835 Body mass index (BMI) 35.0-35.9, adult: Secondary | ICD-10-CM

## 2020-06-23 LAB — BASIC METABOLIC PANEL WITH GFR
BUN: 11 mg/dL (ref 7–25)
CO2: 26 mmol/L (ref 20–32)
Calcium: 8.9 mg/dL (ref 8.6–10.2)
Chloride: 104 mmol/L (ref 98–110)
Creat: 0.82 mg/dL (ref 0.50–1.10)
GFR, Est African American: 101 mL/min/{1.73_m2} (ref >=60)
GFR, Est Non African American: 87 mL/min/{1.73_m2} (ref >=60)
Glucose, Bld: 84 mg/dL (ref 65–99)
Potassium: 3.6 mmol/L (ref 3.5–5.3)
Sodium: 141 mmol/L (ref 135–146)

## 2020-06-23 MED ORDER — ALPRAZOLAM 1 MG PO TABS: ORAL_TABLET | ORAL | 2 refills | Status: DC

## 2020-06-23 NOTE — Progress Notes (Signed)
Subjective:    Patient ID: Michelle Wilson, female    DOB: April 12, 1976, 45 y.o.   MRN: [id number]  HPI: Michelle Wilson is a 45 y.o. female presenting for follow up.  Chief Complaint  Patient presents with  . Follow-up    Anxiety medication buspirone  . Anxiety   LMP was 05/30/2020.  Thinks she may be perimenousal given irregular menses and hot flashes and is requesting blood work be checked.  HYPERTENSION / HYPERLIPIDEMIA Is planning to increase physical activity around daylight saving's time.  Prior to [place], was working out at [employer] 4 days per week.  Since COVID, has been too nervous to join back in the gym.  Reports goal weight is 180 lbs. Satisfied with current treatment? yes Duration of hypertension: chronic BP monitoring frequency: daily BP range:  118/70 BP medication side effects: no Past BP meds: HCTZ 12.5 mg daily Duration of hyperlipidemia: chronic Cholesterol medication side effects: no Cholesterol supplements: none Past cholesterol medications: none Aspirin: no Recent stressors: yes Recurrent headaches: no Visual changes: no Palpitations: no Dyspnea: no Chest pain: no Lower extremity edema: no Dizzy/lightheaded: no  ANXIETY/STRESS Currently taking alprazolam 1 mg three times daily.  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  Recently, prior PCP added in buspirone 7.5 mg two times daily as needed for anxiety.  Thinks this medication is helping a good bit with her anxiety. Duration: chronic Anxious mood: yes  Excessive worrying: yes Irritability: yes  Sweating: yes Nausea: no Palpitations:no Hyperventilation: no Panic attacks: yes Agoraphobia: no  Obscessions/compulsions: no Depressed mood: no Depression screen [employer] 2/9 06/22/2019 12/19/2018 06/13/2018 07/22/2017 05/13/2017  Decreased Interest 0 0 0 0 0  Down, Depressed, Hopeless 1 0 0 0 0  PHQ - 2 Score 1 0 0 0  0  Altered sleeping 1 - - - 1  Tired, decreased energy 1 - - - 0  Change in appetite 0 - - - 0  Feeling bad or failure about yourself  1 - - - 0  Trouble concentrating 0 - - - 0  Moving slowly or fidgety/restless 0 - - - 0  Suicidal thoughts 0 - - - 0  PHQ-9 Score 4 - - - 1  Difficult doing work/chores Not difficult at all - - - Not difficult at all    GAD 7 : Generalized Anxiety Score 06/23/2020  Nervous, Anxious, on Edge 3  Control/stop worrying 2  Worry too much - different things 3  Trouble relaxing 1  Restless 2  Easily annoyed or irritable 3  Afraid - awful might happen 2  Total GAD 7 Score 16  Anxiety Difficulty Somewhat difficult  Anhedonia: no Weight changes: no Insomnia: no   Hypersomnia: no Fatigue/loss of energy: yes Feelings of worthlessness: no Feelings of guilt: yes Impaired concentration/indecisiveness: no Suicidal ideations: no  Crying spells: no Recent Stressors/Life Changes: yes   Relationship problems: no   Family stress: no     Financial stress: no    Job stress: yes    Recent death/loss: no   No Known Allergies  Outpatient Encounter Medications as of 06/23/2020  Medication Sig  . busPIRone (BUSPAR) 7.5 MG tablet Take 1 tablet (7.5 mg total) by mouth 2 (two) times daily. For anxiety  . hydrochlorothiazide (HYDRODIURIL) 12.5 MG tablet Take 1 tablet (12.5 mg total) by mouth daily.  . Prenatal Vit-Fe Fumarate-FA (M-VIT) tablet Take  by mouth daily. Bariatric Advantage w/ iron  . [DISCONTINUED] ALPRAZolam (XANAX) 1 MG tablet TAKE 1 TABLET (1 MG TOTAL) BY MOUTH 3 TIMES DAILY AS NEEDED. FOR ANXIETY  . ALPRAZolam (XANAX) 1 MG tablet TAKE 1 TABLET (1 MG TOTAL) BY MOUTH 3 TIMES DAILY AS NEEDED. FOR ANXIETY   No facility-administered encounter medications on file as of 06/23/2020.    Patient Active Problem List   Diagnosis Date Noted  . Vitamin D deficiency 06/23/2020  . Mild hyperlipidemia 07/22/2017  . Anemia 05/07/2016  . S/P laparoscopic sleeve  gastrectomy Dec 2017 04/12/2016  . Essential hypertension, benign 08/19/2011  . BMI 35.0-35.9,adult 08/19/2011  . Anxiety 08/19/2011  . Seasonal allergies 08/19/2011    Past Medical History:  Diagnosis Date  . Anemia   . Anxiety   . GERD (gastroesophageal reflux disease)    pregnancy  . Hypertension    Aldomet 250 mg Q day  . IBS (irritable bowel syndrome)    no issues currently  . Infection    Yeast inf; no frequent    Relevant past medical, surgical, family and social history reviewed and updated as indicated. Interim medical history since our last visit reviewed.  Review of Systems Per HPI unless specifically indicated above     Objective:    BP 128/80   Pulse 93   Temp 99.5 F (37.5 C)   Ht 5' 5"  (1.651 m)   Wt 214 lb 12.8 oz (97.4 kg)   SpO2 98%   BMI 35.74 kg/m   Wt Readings from Last 3 Encounters:  06/23/20 214 lb 12.8 oz (97.4 kg)  04/15/20 214 lb (97.1 kg)  12/21/19 214 lb (97.1 kg)    Physical Exam Vitals and nursing note reviewed.  Constitutional:      General: She is not in acute distress.    Appearance: Normal appearance. She is not toxic-appearing.  HENT:     Head: Normocephalic and atraumatic.     Right Ear: External ear normal.     Left Ear: External ear normal.  Eyes:     General: No scleral icterus.       Right eye: No discharge.        Left eye: No discharge.     Extraocular Movements: Extraocular movements intact.  Neck:     Vascular: No carotid bruit.  Cardiovascular:     Rate and Rhythm: Normal rate and regular rhythm.     Heart sounds: Normal heart sounds. No murmur heard.   Pulmonary:     Effort: Pulmonary effort is normal. No respiratory distress.     Breath sounds: Normal breath sounds. No wheezing, rhonchi or rales.  Musculoskeletal:        General: Normal range of motion.     Cervical back: Normal range of motion and neck supple.     Right lower leg: No edema.     Left lower leg: No edema.  Lymphadenopathy:      Cervical: No cervical adenopathy.  Skin:    General: Skin is warm and dry.     Capillary Refill: Capillary refill takes less than 2 seconds.     Coloration: Skin is not jaundiced or pale.     Findings: No erythema.  Neurological:     Mental Status: She is alert and oriented to person, place, and time.     Sensory: No sensory deficit.     Motor: No weakness.     Gait: Gait normal.  Psychiatric:  Mood and Affect: Mood normal.        Behavior: Behavior normal.        Thought Content: Thought content normal.        Judgment: Judgment normal.     Results for orders placed or performed in visit on 04/15/20  PAP,TP IMGw/HPV RNA,rflx [id number],55/97  Result Value Ref Range   Clinical Information:     LMP:     PREV. PAP:     PREV. BX:     HPV DNA Probe-Source     STATEMENT OF ADEQUACY:     INTERPRETATION/RESULT:     Comment:     CYTOTECHNOLOGIST:     HPV DNA High Risk Not Detected Not Detect      Assessment & Plan:   Problem List Items Addressed This Visit      Cardiovascular and Mediastinum   Essential hypertension, benign - Primary    Chronic, stable.  BP at goal today in clinic and reports at goal readings at home.  Will continue HCTZ 12.5 mg daily.  BMET with GFR checked today for electrolytes and kidney function.  Follow up in 6 months.      Relevant Orders   CBC with Differential   Basic Metabolic Panel  EGFR     Other   Anemia    History or iron deficiency and vitamin B12 deficiency secondary to gastric sleeve in 2017.  Is currently taking bariatric multivitamin twice daily - continue this.  Will check CBC today.      Relevant Orders   CBC with Differential   Anxiety    Chronic, stable.  Recently started on Buspar 7.5 mg twice daily in addition to alprazolam 1 mg three times daily.  Is trying to not rely as heavily on alprazolam - agree with this plan.  GAD-7 elevated today, although does not wish to increase Buspar at this time.  PDMP reviewed and  appropriate, refills given for alprazolam.  Follow up in 3 months for anxiety or sooner if needed.      Relevant Medications   ALPRAZolam (XANAX) 1 MG tablet   Other Relevant Orders   CBC with Differential   BMI 35.0-35.9,adult    Plans to work on increasing physical activity - discussed goal is 30 minutes 5 times weekly.  Continue dietary changes including increasing lean proteins. Labs checked today - patient is fasting.      Relevant Orders   Basic Metabolic Panel  EGFR   Lipid Panel   Mild hyperlipidemia    Chronic, previously improved.  Will recheck lipids today - patient is fasting.  If ASCVD risk score >10%, will recommend statin.  Otherwise, continue dietary and lifestyle changes for now.      Relevant Orders   Lipid Panel   Vitamin D deficiency    Noted on labs in August.  Will recheck Vitamin D level today.  Continue supplementation for now.      Relevant Orders   VITAMIN D 25 Hydroxy (Vit-D Deficiency, Fractures)    Other Visit Diagnoses    Perimenopausal       Periods are irregular, with hot flashes she may be near menopause.  Will check FSH/LH, prolactin, and TSH today.   Relevant Orders   FSH/LH   Prolactin   TSH       Follow up plan: Return in about 3 months (around 09/20/2020) for virtual for mood, 6 months CPE.

## 2020-06-23 NOTE — Assessment & Plan Note (Signed)
Chronic, stable.  BP at goal today in clinic and reports at goal readings at home.  Will continue HCTZ 12.5 mg daily.  BMET with GFR checked today for electrolytes and kidney function.  Follow up in 6 months.

## 2020-06-23 NOTE — Addendum Note (Signed)
Addended by: Byren Pankow A on: 06/23/2020 01:56 PM   Modules accepted: Orders  

## 2020-06-23 NOTE — Assessment & Plan Note (Signed)
History or iron deficiency and vitamin B12 deficiency secondary to gastric sleeve in 2017.  Is currently taking bariatric multivitamin twice daily - continue this.  Will check CBC today.

## 2020-06-23 NOTE — Assessment & Plan Note (Signed)
Chronic, stable.  Recently started on Buspar 7.5 mg twice daily in addition to alprazolam 1 mg three times daily.  Is trying to not rely as heavily on alprazolam - agree with this plan.  GAD-7 elevated today, although does not wish to increase Buspar at this time.  PDMP reviewed and appropriate, refills given for alprazolam.  Follow up in 3 months for anxiety or sooner if needed.

## 2020-06-23 NOTE — Assessment & Plan Note (Signed)
Chronic, previously improved.  Will recheck lipids today - patient is fasting.  If ASCVD risk score >10%, will recommend statin.  Otherwise, continue dietary and lifestyle changes for now.

## 2020-06-23 NOTE — Patient Instructions (Addendum)
F/u in 3 months for anxiety - virtual   Nice meeting you Michelle Wilson!  Take care and let me know with any questions or concerns.  Textbook of family medicine (9th ed., pp. 5277-8242). [place], [place]: [employer].">  Stress, Adult Stress is a normal reaction to life events. Stress is what you feel when life demands more than you are used to, or more than you think you can handle. Some stress can be useful, such as studying for a test or meeting a deadline at work. Stress that occurs too often or for too long can cause problems. It can affect your emotional health and interfere with relationships and normal daily activities. Too much stress can weaken your body's defense system (immune system) and increase your risk for physical illness. If you already have a medical problem, stress can make it worse. What are the causes? All sorts of life events can cause stress. An event that causes stress for one person may not be stressful for another person. Major life events, whether positive or negative, commonly cause stress. Examples include:  Losing a job or starting a new job.  Losing a loved one.  Moving to a new town or home.  Getting married or divorced.  Having a baby.  Getting injured or sick. Less obvious life events can also cause stress, especially if they occur day after day or in combination with each other. Examples include:  Working long hours.  Driving in traffic.  Caring for children.  Being in debt.  Being in a difficult relationship. What are the signs or symptoms? Stress can cause emotional symptoms, including:  Anxiety. This is feeling worried, afraid, on edge, overwhelmed, or out of control.  Anger, including irritation or impatience.  Depression. This is feeling sad, down, helpless, or guilty.  Trouble focusing, remembering, or making decisions. Stress can cause physical symptoms, including:  Aches and pains. These may affect your head, neck, back, stomach, or other  areas of your body.  Tight muscles or a clenched jaw.  Low energy.  Trouble sleeping. Stress can cause unhealthy behaviors, including:  Eating to feel better (overeating) or skipping meals.  Working too much or putting off tasks.  Smoking, drinking alcohol, or using drugs to feel better. How is this diagnosed? Stress is diagnosed through an assessment by your health care provider. He or she may diagnose this condition based on:  Your symptoms and any stressful life events.  Your medical history.  Tests to rule out other causes of your symptoms. Depending on your condition, your health care provider may refer you to a specialist for further evaluation. How is this treated? Stress management techniques are the recommended treatment for stress. Medicine is not typically recommended for the treatment of stress. Techniques to reduce your reaction to stressful life events include:  Stress identification. Monitor yourself for symptoms of stress and identify what causes stress for you. These skills may help you to avoid or prepare for stressful events.  Time management. Set your priorities, keep a calendar of events, and learn to say no. Taking these actions can help you avoid making too many commitments. Techniques for coping with stress include:  Rethinking the problem. Try to think realistically about stressful events rather than ignoring them or overreacting. Try to find the positives in a stressful situation rather than focusing on the negatives.  Exercise. Physical exercise can release both physical and emotional tension. The key is to find a form of exercise that you enjoy and do it regularly.  Relaxation techniques. These relax the body and mind. The key is to find one or more that you enjoy and use the techniques regularly. Examples include: ? Meditation, deep breathing, or progressive relaxation techniques. ? Yoga or tai chi. ? Biofeedback, mindfulness techniques, or  journaling. ? Listening to music, being out in nature, or participating in other hobbies.  Practicing a healthy lifestyle. Eat a balanced diet, drink plenty of water, limit or avoid caffeine, and get plenty of sleep.  Having a strong support network. Spend time with family, friends, or other people you enjoy being around. Express your feelings and talk things over with someone you trust. Counseling or talk therapy with a mental health professional may be helpful if you are having trouble managing stress on your own.   Follow these instructions at home: Lifestyle  Avoid drugs.  Do not use any products that contain nicotine or tobacco, such as cigarettes, e-cigarettes, and chewing tobacco. If you need help quitting, ask your health care provider.  Limit alcohol intake to no more than 1 drink a day for nonpregnant women and 2 drinks a day for men. One drink equals 12 oz of beer, 5 oz of wine, or 1 oz of hard liquor  Do not use alcohol or drugs to relax.  Eat a balanced diet that includes fresh fruits and vegetables, whole grains, lean meats, fish, eggs, and beans, and low-fat dairy. Avoid processed foods and foods high in added fat, sugar, and salt.  Exercise at least 30 minutes on 5 or more days each week.  Get 7-8 hours of sleep each night.   General instructions  Practice stress management techniques as discussed with your health care provider.  Drink enough fluid to keep your urine clear or pale yellow.  Take over-the-counter and prescription medicines only as told by your health care provider.  Keep all follow-up visits as told by your health care provider. This is important.   Contact a health care provider if:  Your symptoms get worse.  You have new symptoms.  You feel overwhelmed by your problems and can no longer manage them on your own. Get help right away if:  You have thoughts of hurting yourself or others. If you ever feel like you may hurt yourself or others, or  have thoughts about taking your own life, get help right away. You can go to your nearest emergency department or call:  Your local emergency services (911 in the [place]  A suicide crisis helpline, such as the [facility] at 915 817 9562. This is open 24 hours a day. Summary  Stress is a normal reaction to life events. It can cause problems if it happens too often or for too long.  Practicing stress management techniques is the best way to treat stress.  Counseling or talk therapy with a mental health professional may be helpful if you are having trouble managing stress on your own. This information is not intended to replace advice given to you by your health care provider. Make sure you discuss any questions you have with your health care provider. Document Revised: 12/28/2019 Document Reviewed: 12/28/2019 Elsevier Patient Education  2021 [employer].

## 2020-06-23 NOTE — Assessment & Plan Note (Addendum)
Plans to work on increasing physical activity - discussed goal is 30 minutes 5 times weekly.  Continue dietary changes including increasing lean proteins. Labs checked today - patient is fasting.

## 2020-06-23 NOTE — Assessment & Plan Note (Signed)
Noted on labs in August.  Will recheck Vitamin D level today.  Continue supplementation for now.

## 2020-06-24 LAB — PROLACTIN: Prolactin: 6.3 ng/mL

## 2020-06-24 LAB — TSH: TSH: 0.64 mIU/L

## 2020-06-26 LAB — TIQ-MISC

## 2020-06-26 LAB — CBC WITH DIFFERENTIAL/PLATELET
Absolute Monocytes: 401 cells/uL (ref 200–950)
Basophils Absolute: 30 cells/uL (ref 0–200)
Basophils Relative: 0.5 %
Eosinophils Absolute: 89 cells/uL (ref 15–500)
Eosinophils Relative: 1.5 %
HCT: 36.8 % (ref 35.0–45.0)
Hemoglobin: 12.3 g/dL (ref 11.7–15.5)
Lymphs Abs: 1664 cells/uL (ref 850–3900)
MCH: 28.4 pg (ref 27.0–33.0)
MCHC: 33.4 g/dL (ref 32.0–36.0)
MCV: 85 fL (ref 80.0–100.0)
MPV: 9.6 fL (ref 7.5–12.5)
Monocytes Relative: 6.8 %
Neutro Abs: 3717 cells/uL (ref 1500–7800)
Neutrophils Relative %: 63 %
Platelets: 277 10*3/uL (ref 140–400)
RBC: 4.33 10*6/uL (ref 3.80–5.10)
RDW: 13.3 % (ref 11.0–15.0)
Total Lymphocyte: 28.2 %
WBC: 5.9 10*3/uL (ref 3.8–10.8)

## 2020-06-26 LAB — LIPID PANEL
Cholesterol: 194 mg/dL (ref <200)
HDL: 61 mg/dL (ref >=50)
LDL Cholesterol (Calc): 117 mg/dL (calc) — ABNORMAL HIGH
Non-HDL Cholesterol (Calc): 133 mg/dL (calc) — ABNORMAL HIGH (ref <130)
Total CHOL/HDL Ratio: 3.2 (calc) (ref <5.0)
Triglycerides: 68 mg/dL (ref <150)

## 2020-06-26 LAB — FSH/LH
FSH: 6 m[IU]/mL
LH: 6.8 m[IU]/mL

## 2020-06-26 LAB — VITAMIN D 25 HYDROXY (VIT D DEFICIENCY, FRACTURES): Vit D, 25-Hydroxy: 40 ng/mL (ref 30–100)

## 2020-06-30 MED FILL — busPIRone HCL 7.5 MG TABS: 7.5 | 30 days supply | Qty: 60 | Fill #1

## 2020-06-30 MED FILL — HYDROCHLOROTHIAZIDE 12.5 MG: 12.5 | 90 days supply | Qty: 90 | Fill #2

## 2020-07-26 ENCOUNTER — Other Ambulatory Visit (HOSPITAL_COMMUNITY): Payer: Self-pay

## 2020-08-03 MED FILL — Buspirone HCl Tab 7.5 MG: ORAL | 30 days supply | Qty: 60 | Fill #0 | Status: AC

## 2020-08-04 ENCOUNTER — Other Ambulatory Visit (HOSPITAL_COMMUNITY): Payer: Self-pay

## 2020-08-11 ENCOUNTER — Other Ambulatory Visit (HOSPITAL_COMMUNITY): Payer: Self-pay

## 2020-08-11 MED FILL — Alprazolam Tab 1 MG: ORAL | 30 days supply | Qty: 90 | Fill #0 | Status: AC

## 2020-09-01 ENCOUNTER — Other Ambulatory Visit: Payer: Self-pay | Admitting: Family Medicine

## 2020-09-02 ENCOUNTER — Other Ambulatory Visit (HOSPITAL_COMMUNITY): Payer: Self-pay

## 2020-09-02 MED ORDER — BUSPIRONE HCL 7.5 MG PO TABS
7.5000 mg | ORAL_TABLET | Freq: Two times a day (BID) | ORAL | 2 refills | Status: DC
  Filled 2020-09-02: qty 60, 30d supply, fill #0

## 2020-09-09 ENCOUNTER — Other Ambulatory Visit (HOSPITAL_COMMUNITY): Payer: Self-pay

## 2020-09-09 MED FILL — Alprazolam Tab 1 MG: ORAL | 30 days supply | Qty: 90 | Fill #1 | Status: AC

## 2020-09-24 ENCOUNTER — Telehealth (INDEPENDENT_AMBULATORY_CARE_PROVIDER_SITE_OTHER): Payer: 59 | Admitting: Nurse Practitioner

## 2020-09-24 ENCOUNTER — Other Ambulatory Visit: Payer: Self-pay

## 2020-09-24 ENCOUNTER — Encounter: Payer: Self-pay | Admitting: Nurse Practitioner

## 2020-09-24 ENCOUNTER — Other Ambulatory Visit (HOSPITAL_COMMUNITY): Payer: Self-pay

## 2020-09-24 DIAGNOSIS — I10 Essential (primary) hypertension: Secondary | ICD-10-CM

## 2020-09-24 DIAGNOSIS — F419 Anxiety disorder, unspecified: Secondary | ICD-10-CM

## 2020-09-24 MED ORDER — BUSPIRONE HCL 10 MG PO TABS
10.0000 mg | ORAL_TABLET | Freq: Two times a day (BID) | ORAL | 1 refills | Status: DC
  Filled 2020-09-24: qty 180, 90d supply, fill #0

## 2020-09-24 MED ORDER — HYDROCHLOROTHIAZIDE 12.5 MG PO TABS
12.5000 mg | ORAL_TABLET | Freq: Every day | ORAL | 1 refills | Status: DC
  Filled 2020-09-24: qty 90, 90d supply, fill #0

## 2020-09-24 MED ORDER — ALPRAZOLAM 1 MG PO TABS
1.0000 mg | ORAL_TABLET | Freq: Three times a day (TID) | ORAL | 2 refills | Status: DC | PRN
  Filled 2020-09-24 – 2020-10-09 (×2): qty 90, 30d supply, fill #0
  Filled 2020-11-07: qty 90, 30d supply, fill #1
  Filled 2020-12-08: qty 90, 30d supply, fill #2

## 2020-09-24 NOTE — Patient Instructions (Signed)
Textbook of family medicine (9th ed., pp. 1062-1073). [place], [place]: [employer].">  Stress, Adult Stress is a normal reaction to life events. Stress is what you feel when life demands more than you are used to, or more than you think you can handle. Some stress can be useful, such as studying for a test or meeting a deadline at work. Stress that occurs too often or for too long can cause problems. It can affect your emotional health and interfere with relationships and normal daily activities. Too much stress can weaken your body's defense system (immune system) and increase your risk for physical illness. If you already have a medical problem, stress can make it worse. What are the causes? All sorts of life events can cause stress. An event that causes stress for one person may not be stressful for another person. Major life events, whether positive or negative, commonly cause stress. Examples include:  Losing a job or starting a new job.  Losing a loved one.  Moving to a new town or home.  Getting married or divorced.  Having a baby.  Getting injured or sick. Less obvious life events can also cause stress, especially if they occur day after day or in combination with each other. Examples include:  Working long hours.  Driving in traffic.  Caring for children.  Being in debt.  Being in a difficult relationship. What are the signs or symptoms? Stress can cause emotional symptoms, including:  Anxiety. This is feeling worried, afraid, on edge, overwhelmed, or out of control.  Anger, including irritation or impatience.  Depression. This is feeling sad, down, helpless, or guilty.  Trouble focusing, remembering, or making decisions. Stress can cause physical symptoms, including:  Aches and pains. These may affect your head, neck, back, stomach, or other areas of your body.  Tight muscles or a clenched jaw.  Low energy.  Trouble sleeping. Stress can cause unhealthy  behaviors, including:  Eating to feel better (overeating) or skipping meals.  Working too much or putting off tasks.  Smoking, drinking alcohol, or using drugs to feel better. How is this diagnosed? Stress is diagnosed through an assessment by your health care provider. He or she may diagnose this condition based on:  Your symptoms and any stressful life events.  Your medical history.  Tests to rule out other causes of your symptoms. Depending on your condition, your health care provider may refer you to a specialist for further evaluation. How is this treated? Stress management techniques are the recommended treatment for stress. Medicine is not typically recommended for the treatment of stress. Techniques to reduce your reaction to stressful life events include:  Stress identification. Monitor yourself for symptoms of stress and identify what causes stress for you. These skills may help you to avoid or prepare for stressful events.  Time management. Set your priorities, keep a calendar of events, and learn to say no. Taking these actions can help you avoid making too many commitments. Techniques for coping with stress include:  Rethinking the problem. Try to think realistically about stressful events rather than ignoring them or overreacting. Try to find the positives in a stressful situation rather than focusing on the negatives.  Exercise. Physical exercise can release both physical and emotional tension. The key is to find a form of exercise that you enjoy and do it regularly.  Relaxation techniques. These relax the body and mind. The key is to find one or more that you enjoy and use the techniques regularly. Examples include: ?   Meditation, deep breathing, or progressive relaxation techniques. ? Yoga or tai chi. ? Biofeedback, mindfulness techniques, or journaling. ? Listening to music, being out in nature, or participating in other hobbies.  Practicing a healthy lifestyle.  Eat a balanced diet, drink plenty of water, limit or avoid caffeine, and get plenty of sleep.  Having a strong support network. Spend time with family, friends, or other people you enjoy being around. Express your feelings and talk things over with someone you trust. Counseling or talk therapy with a mental health professional may be helpful if you are having trouble managing stress on your own.   Follow these instructions at home: Lifestyle  Avoid drugs.  Do not use any products that contain nicotine or tobacco, such as cigarettes, e-cigarettes, and chewing tobacco. If you need help quitting, ask your health care provider.  Limit alcohol intake to no more than 1 drink a day for nonpregnant women and 2 drinks a day for men. One drink equals 12 oz of beer, 5 oz of wine, or 1 oz of hard liquor  Do not use alcohol or drugs to relax.  Eat a balanced diet that includes fresh fruits and vegetables, whole grains, lean meats, fish, eggs, and beans, and low-fat dairy. Avoid processed foods and foods high in added fat, sugar, and salt.  Exercise at least 30 minutes on 5 or more days each week.  Get 7-8 hours of sleep each night.   General instructions  Practice stress management techniques as discussed with your health care provider.  Drink enough fluid to keep your urine clear or pale yellow.  Take over-the-counter and prescription medicines only as told by your health care provider.  Keep all follow-up visits as told by your health care provider. This is important.   Contact a health care provider if:  Your symptoms get worse.  You have new symptoms.  You feel overwhelmed by your problems and can no longer manage them on your own. Get help right away if:  You have thoughts of hurting yourself or others. If you ever feel like you may hurt yourself or others, or have thoughts about taking your own life, get help right away. You can go to your nearest emergency department or  call:  Your local emergency services (911 in the [place]  A suicide crisis helpline, such as the National Suicide Prevention Lifeline at 1-800-273-8255. This is open 24 hours a day. Summary  Stress is a normal reaction to life events. It can cause problems if it happens too often or for too long.  Practicing stress management techniques is the best way to treat stress.  Counseling or talk therapy with a mental health professional may be helpful if you are having trouble managing stress on your own. This information is not intended to replace advice given to you by your health care provider. Make sure you discuss any questions you have with your health care provider. Document Revised: 12/28/2019 Document Reviewed: 12/28/2019 Elsevier Patient Education  2021 [employer]  

## 2020-09-24 NOTE — Assessment & Plan Note (Signed)
Chronic.  BP controlled at home.  Continue HCTZ 12.5 mg daily for now.   Refill sent in.  Follow up 3 months for lab work.

## 2020-09-24 NOTE — Progress Notes (Signed)
Subjective:    Patient ID: Michelle Wilson, female    DOB: 07-22-1975, 45 y.o.   MRN: [id number]  HPI: Michelle Wilson is a 45 y.o. female presenting virtually for mood follow up.  Chief Complaint  Patient presents with  . Hypertension    Takes bp often, bp has been in normal range  . hormnal issue    Wants to discuss with pcp only including anxiety screening  . Medication Refill    Xanax, hctz,    ANXIETY/STRESS LMP: March; thinks she may be going through perimenopause. Reports worsening mood swings, hot flashes, and weight gain even though going to the gym 5 days per week.  Drinking 32 oz of water daily. Duration: chronic Anxious mood: yes  Excessive worrying: yes Irritability: yes  Sweating: no Nausea: no Palpitations:yes Hyperventilation: no Panic attacks: no Agoraphobia: no  Obscessions/compulsions: no Depressed mood: no  GAD 7 : Generalized Anxiety Score 09/24/2020 06/23/2020  Nervous, Anxious, on Edge 3 3  Control/stop worrying 2 2  Worry too much - different things 2 3  Trouble relaxing 0 1  Restless 2 2  Easily annoyed or irritable 3 3  Afraid - awful might happen 1 2  Total GAD 7 Score 13 16  Anxiety Difficulty Somewhat difficult Somewhat difficult   Anhedonia: no Weight changes: yes Insomnia: no   Hypersomnia: no Fatigue/loss of energy: no Feelings of worthlessness: no Feelings of guilt: no Impaired concentration/indecisiveness: no Suicidal ideations: no  Crying spells: no Recent Stressors/Life Changes: yes   Relationship problems: no   Family stress: no     Financial stress: no    Job stress: yes    Recent death/loss: no   No Known Allergies  Outpatient Encounter Medications as of 09/24/2020  Medication Sig  . Prenatal Vit-Fe Fumarate-FA (M-VIT) tablet Take by mouth daily. Bariatric Advantage w/ iron  . [DISCONTINUED] ALPRAZolam (XANAX) 1 MG tablet TAKE 1 TABLET (1 MG TOTAL) BY MOUTH 3 TIMES DAILY AS NEEDED FOR ANXIETY  .  [DISCONTINUED] busPIRone (BUSPAR) 7.5 MG tablet Take 1 tablet (7.5 mg total) by mouth 2 (two) times daily for anxiety.  . [DISCONTINUED] hydrochlorothiazide (HYDRODIURIL) 12.5 MG tablet TAKE 1 TABLET (12.5 MG TOTAL) BY MOUTH DAILY.  Marland Kitchen ALPRAZolam (XANAX) 1 MG tablet Take 1 tablet (1 mg total) by mouth 3 (three) times daily as needed for anxiety.  . busPIRone (BUSPAR) 10 MG tablet Take 1 tablet (10 mg total) by mouth 2 (two) times daily.  . hydrochlorothiazide (HYDRODIURIL) 12.5 MG tablet Take 1 tablet (12.5 mg total) by mouth daily.   No facility-administered encounter medications on file as of 09/24/2020.    Patient Active Problem List   Diagnosis Date Noted  . Vitamin D deficiency 06/23/2020  . Mild hyperlipidemia 07/22/2017  . Anemia 05/07/2016  . 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 Essential hypertension, benign 08/19/2011  . BMI 35.0-35.9,adult 08/19/2011  . Anxiety 08/19/2011  . Seasonal allergies 08/19/2011    Past Medical History:  Diagnosis Date  . Anemia   . Anxiety   . GERD (gastroesophageal reflux disease)    pregnancy  . Hypertension    Aldomet 250 mg Q day  . IBS (irritable bowel syndrome)    no issues currently  . Infection    Yeast inf; no frequent    Relevant past medical, surgical, family and social history reviewed and updated as indicated. Interim medical history since our last visit reviewed.  Review of Systems Per HPI unless  specifically indicated above     Objective:    There were no vitals taken for this visit.  Wt Readings from Last 3 Encounters:  06/23/20 214 lb 12.8 oz (97.4 kg)  04/15/20 214 lb (97.1 kg)  12/21/19 214 lb (97.1 kg)    Physical Exam Vitals and nursing note reviewed.  Constitutional:      General: She is not in acute distress.    Appearance: Normal appearance. She is not toxic-appearing.  Eyes:     General: No scleral icterus. Cardiovascular:     Comments: Unable to assess heart sounds via virtual  visit.  Pulmonary:     Effort: Pulmonary effort is normal. No respiratory distress.     Comments: Unable to assess breath sounds via virtual visit.  Patient talking in complete sentences during examination without accessory muscle use.   Skin:    Coloration: Skin is not jaundiced or pale.     Findings: No erythema.  Neurological:     Mental Status: She is alert and oriented to person, place, and time.  Psychiatric:        Mood and Affect: Mood normal.        Behavior: Behavior normal.        Thought Content: Thought content normal.        Judgment: Judgment normal.       Assessment & Plan:   Problem List Items Addressed This Visit      Cardiovascular and Mediastinum   Essential hypertension, benign - Primary    Chronic.  BP controlled at home.  Continue HCTZ 12.5 mg daily for now.   Refill sent in.  Follow up 3 months for lab work.      Relevant Medications   hydrochlorothiazide (HYDRODIURIL) 12.5 MG tablet     Other   Anxiety    Chronic.  GAD-7 remains elevated today and patient with reports of poor control of mood.  Will increase Buspar to 10 mg twice daily and follow up in 3 months.  Discussed alprazolam - still taking 1 mg three times daily and has withdrawal symptoms when she tries to stop cold [place].  Pt is aware of risks of psychoactive medication use to include increased sedation, respiratory suppression, falls, extrapyramidal movements,  dependence and cardiovascular events.  Pt would like to continue treatment as benefit determined to outweigh risk.   Discussed that will plan to try to wean once anxiety/mood under better control in future.  PDMP reviewed and appropriate - refill for alprazolam given.  Needs controlled substance agreement at next visit.  Follow up in 3 months.      Relevant Medications   busPIRone (BUSPAR) 10 MG tablet   ALPRAZolam (XANAX) 1 MG tablet       Follow up plan: Return for as scheduled.   Due to the catastrophic nature of the COVID-19  pandemic, this video visit was completed soley via audio and visual contact via [facility] due to the restrictions of the COVID-19 pandemic.  All issues as above were discussed and addressed. Physical exam was done as above through visual confirmation on [facility]. If it was felt that the patient should be evaluated in the office, they were directed there. The patient verbally consented to this visit. . Location of the patient: home . Location of the provider: work . Those involved with this call:  . Provider: Cathlean Marseilles, DNP, FNP-C . CMA: Moises Blood, CMA . Front Desk/Registration: Claudine Mouton  . Time spent on call: 15 minutes with  patient face to face via video conference. More than 50% of this time was spent in counseling and coordination of care. 20 minutes total spent in review of patient's record and preparation of their chart.  I verified patient identity using two factors (patient name and date of birth). Patient consents verbally to being seen via telemedicine visit today.

## 2020-09-24 NOTE — Assessment & Plan Note (Addendum)
Chronic.  GAD-7 remains elevated today and patient with reports of poor control of mood.  Will increase Buspar to 10 mg twice daily and follow up in 3 months.  Discussed alprazolam - still taking 1 mg three times daily and has withdrawal symptoms when she tries to stop cold [place].  Pt is aware of risks of psychoactive medication use to include increased sedation, respiratory suppression, falls, extrapyramidal movements,  dependence and cardiovascular events.  Pt would like to continue treatment as benefit determined to outweigh risk.   Discussed that will plan to try to wean once anxiety/mood under better control in future.  PDMP reviewed and appropriate - refill for alprazolam given.  Needs controlled substance agreement at next visit.  Follow up in 3 months.

## 2020-10-01 ENCOUNTER — Other Ambulatory Visit (HOSPITAL_COMMUNITY): Payer: Self-pay

## 2020-10-09 ENCOUNTER — Other Ambulatory Visit (HOSPITAL_COMMUNITY): Payer: Self-pay

## 2020-11-06 ENCOUNTER — Other Ambulatory Visit (HOSPITAL_COMMUNITY): Payer: Self-pay

## 2020-11-07 ENCOUNTER — Other Ambulatory Visit (HOSPITAL_COMMUNITY): Payer: Self-pay

## 2020-11-07 ENCOUNTER — Encounter (HOSPITAL_COMMUNITY): Payer: Self-pay | Admitting: *Deleted

## 2020-11-11 ENCOUNTER — Telehealth: Payer: Self-pay

## 2020-11-11 ENCOUNTER — Other Ambulatory Visit (HOSPITAL_COMMUNITY): Payer: Self-pay

## 2020-11-11 DIAGNOSIS — U071 COVID-19: Secondary | ICD-10-CM

## 2020-11-11 MED ORDER — NIRMATRELVIR/RITONAVIR (PAXLOVID)TABLET
3.0000 | ORAL_TABLET | Freq: Two times a day (BID) | ORAL | 0 refills | Status: AC
  Filled 2020-11-11: qty 30, 5d supply, fill #0

## 2020-11-11 NOTE — Telephone Encounter (Signed)
Medication sent.

## 2020-12-04 ENCOUNTER — Other Ambulatory Visit (HOSPITAL_COMMUNITY): Payer: Self-pay | Admitting: Nurse Practitioner

## 2020-12-04 DIAGNOSIS — Z1231 Encounter for screening mammogram for malignant neoplasm of breast: Secondary | ICD-10-CM

## 2020-12-08 ENCOUNTER — Other Ambulatory Visit (HOSPITAL_COMMUNITY): Payer: Self-pay

## 2020-12-18 NOTE — Progress Notes (Signed)
Subjective:    Patient ID: Michelle Wilson, female    DOB: March 24, 1976, 45 y.o.   MRN: [id number]  HPI: Michelle Wilson is a 45 y.o. female presenting for follow up.  Chief Complaint  Patient presents with   Follow-up   Had COVID about 1 month ago.  Had not had period in 6 months and it started back yesterday.  Wondering if COVID illness may have triggered ths.  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  She is really enjoying her new role.  It is a hybrid position so she can flex her hours and work at home some and spending more time with her family.  She is taking alprazolam 1 mg three times daily and buspar 10 mg twice daily.  She notices when she does not take alprazolam, she gets very jumpy and anxious. She does not want to take this medication long term, but does not feel like her mood is stable enough to start decreasing on it yet.  HYPERTENSION Hypertension status: elevated today in clinic, normal at home when she checks regularly. Satisfied with current treatment? yes Duration of hypertension: chronic BP monitoring frequency:  daily BP range: 120/70 BP medication side effects:  no Medication compliance: excellent Aspirin: no Recurrent headaches: no Visual changes: no Palpitations: no Dyspnea: no Chest pain: no Lower extremity edema: no Dizzy/lightheaded: no  No Known Allergies  Outpatient Encounter Medications as of 12/19/2020  Medication Sig   Multiple Vitamins-Minerals (BARIATRIC MULTIVITAMINS/IRON PO) Take by mouth.   [START ON 01/08/2021] ALPRAZolam (XANAX) 1 MG tablet Take 1 tablet (1 mg total) by mouth 3 (three) times daily as needed for anxiety.   busPIRone (BUSPAR) 10 MG tablet Take 1 tablet (10 mg total) by mouth 2 (two) times daily.   hydrochlorothiazide (HYDRODIURIL) 12.5 MG tablet Take 1 tablet (12.5 mg total) by mouth daily.   [DISCONTINUED] ALPRAZolam (XANAX) 1 MG tablet Take 1 tablet (1 mg  total) by mouth 3 (three) times daily as needed for anxiety.   [DISCONTINUED] busPIRone (BUSPAR) 10 MG tablet Take 1 tablet (10 mg total) by mouth 2 (two) times daily.   [DISCONTINUED] hydrochlorothiazide (HYDRODIURIL) 12.5 MG tablet Take 1 tablet (12.5 mg total) by mouth daily.   [DISCONTINUED] Prenatal Vit-Fe Fumarate-FA (M-VIT) tablet Take by mouth daily. Bariatric Advantage w/ iron   No facility-administered encounter medications on file as of 12/19/2020.    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 Interim medical history since our last visit reviewed.  Review of Systems Per HPI unless specifically indicated above     Objective:    BP (!) 148/98   Pulse 76   Temp 98.5 F (36.9 C) (Oral)   Ht 5\' 5"  (1.651 m)   Wt 216 lb  9.6 oz (98.2 kg)   LMP 12/18/2020   SpO2 98%   BMI 36.04 kg/m   Wt Readings from Last 3 Encounters:  12/19/20 216 lb 9.6 oz (98.2 kg)  06/23/20 214 lb 12.8 oz (97.4 kg)  04/15/20 214 lb (97.1 kg)    Physical Exam Vitals and nursing note reviewed.  Constitutional:      General: She is not in acute distress.    Appearance: Normal appearance. She is not toxic-appearing.  Eyes:     General: No scleral icterus.       Right eye: No discharge.        Left eye: No discharge.     Extraocular Movements: Extraocular movements  intact.  Neck:     Vascular: No carotid bruit.  Cardiovascular:     Rate and Rhythm: Normal rate and regular rhythm.     Heart sounds: Normal heart sounds. No murmur heard. Pulmonary:     Effort: Pulmonary effort is normal. No respiratory distress.     Breath sounds: Normal breath sounds. No wheezing, rhonchi or rales.  Musculoskeletal:     Cervical back: Normal range of motion.     Right lower leg: No edema.     Left lower leg: No edema.  Lymphadenopathy:     Cervical: No cervical adenopathy.  Skin:    General: Skin is warm and dry.     Capillary Refill: Capillary refill takes less than 2 seconds.     Coloration: Skin is not jaundiced or pale.     Findings: No erythema.  Neurological:     Mental Status: She is alert and oriented to person, place, and time.     Sensory: No sensory deficit.     Motor: No weakness.     Gait: Gait normal.  Psychiatric:        Mood and Affect: Mood normal.        Behavior: Behavior normal.        Thought Content: Thought content normal.        Judgment: Judgment normal.      Assessment & Plan:   Problem List Items Addressed This Visit       Cardiovascular and Mediastinum   Essential hypertension, benign - Primary    Chronic.  Blood pressure slightly elevated today in clinic, however remains at goal at home.  Encouraged patient to continue checking blood pressure at home-if running greater than 130/80 consistently, return to clinic.  We will check kidney function with electrolytes and follow-up in 4 months.      Relevant Medications   hydrochlorothiazide (HYDRODIURIL) 12.5 MG tablet   Other Relevant Orders   COMPLETE METABOLIC PANEL WITH GFR (Completed)     Other   Vitamin D deficiency    Check vitamin D level today.  History of sleeve gastrectomy-currently taking bariatric vitamin with iron.       Relevant Orders   VITAMIN D 25 Hydroxy (Vit-D Deficiency, Fractures) (Completed)   S/P laparoscopic sleeve gastrectomy Dec 2017    Check  CBC, vitamin D, vitamin B12.  Currently taking bariatric vitamin with iron.      Relevant Orders   Lipid panel (Completed)   COMPLETE METABOLIC PANEL WITH GFR (Completed)   CBC with Differential/Platelet (Completed)   Mild hyperlipidemia    Chronic.  This is previously been diet controlled.  We will check lipids today-patient is fasting.  Consider statin if ASCVD risk is greater than 7.5%.         Relevant  Medications   hydrochlorothiazide (HYDRODIURIL) 12.5 MG tablet   Anxiety    Chronic.  Patient currently taking BuSpar 10 mg twice daily and alprazolam 1 mg 3 times daily.  Pt is aware of risks of psychoactive medication use to include increased sedation, respiratory suppression, falls, extrapyramidal movements,  dependence and cardiovascular events.  Pt would like to continue treatment as benefit determined to outweigh risk.  She desires weaning down to alprazolam 0.5mg  daily prn in the future, however is not ready to start weaning process yet. Controlled substance agreement signed today.  UDS at next appointment. PDMP reviewed and appropriate-refills given for the next 4 months.  Plan to follow-up in 4 months.      Relevant Medications   ALPRAZolam (XANAX) 1 MG tablet (Start on 01/08/2021)   busPIRone (BUSPAR) 10 MG tablet   Other Relevant Orders   CBC with Differential/Platelet (Completed)   Other Visit Diagnoses     Screening for diabetes mellitus       Relevant Orders   Hemoglobin A1c (Completed)        Follow up plan: Return in about 4 months (around 04/20/2021) for anxiety follow up.

## 2020-12-19 ENCOUNTER — Ambulatory Visit: Payer: 59 | Admitting: Nurse Practitioner

## 2020-12-19 ENCOUNTER — Other Ambulatory Visit: Payer: Self-pay

## 2020-12-19 ENCOUNTER — Other Ambulatory Visit (HOSPITAL_COMMUNITY): Payer: Self-pay

## 2020-12-19 ENCOUNTER — Encounter: Payer: Self-pay | Admitting: Nurse Practitioner

## 2020-12-19 VITALS — BP 148/98 | HR 76 | Temp 98.5°F | Ht 65.0 in | Wt 216.6 lb

## 2020-12-19 DIAGNOSIS — F419 Anxiety disorder, unspecified: Secondary | ICD-10-CM | POA: Diagnosis not present

## 2020-12-19 DIAGNOSIS — Z9884 Bariatric surgery status: Secondary | ICD-10-CM | POA: Diagnosis not present

## 2020-12-19 DIAGNOSIS — E785 Hyperlipidemia, unspecified: Secondary | ICD-10-CM

## 2020-12-19 DIAGNOSIS — Z131 Encounter for screening for diabetes mellitus: Secondary | ICD-10-CM

## 2020-12-19 DIAGNOSIS — E559 Vitamin D deficiency, unspecified: Secondary | ICD-10-CM

## 2020-12-19 DIAGNOSIS — I10 Essential (primary) hypertension: Secondary | ICD-10-CM | POA: Diagnosis not present

## 2020-12-19 MED ORDER — HYDROCHLOROTHIAZIDE 12.5 MG PO TABS
12.5000 mg | ORAL_TABLET | Freq: Every day | ORAL | 1 refills | Status: DC
  Filled 2020-12-19: qty 90, 90d supply, fill #0
  Filled 2021-03-30: qty 90, 90d supply, fill #1

## 2020-12-19 MED ORDER — BUSPIRONE HCL 10 MG PO TABS
10.0000 mg | ORAL_TABLET | Freq: Two times a day (BID) | ORAL | 1 refills | Status: DC
  Filled 2020-12-19: qty 180, 90d supply, fill #0

## 2020-12-19 MED ORDER — ALPRAZOLAM 1 MG PO TABS
1.0000 mg | ORAL_TABLET | Freq: Three times a day (TID) | ORAL | 3 refills | Status: DC | PRN
  Filled 2020-12-19 – 2021-01-08 (×2): qty 90, 30d supply, fill #0
  Filled 2021-02-06: qty 90, 30d supply, fill #1
  Filled 2021-03-10: qty 90, 30d supply, fill #2
  Filled 2021-04-09: qty 90, 30d supply, fill #3

## 2020-12-20 LAB — LIPID PANEL
Cholesterol: 185 mg/dL (ref <200)
HDL: 60 mg/dL (ref >=50)
LDL Cholesterol (Calc): 111 mg/dL (calc) — ABNORMAL HIGH
Non-HDL Cholesterol (Calc): 125 mg/dL (calc) (ref <130)
Total CHOL/HDL Ratio: 3.1 (calc) (ref <5.0)
Triglycerides: 47 mg/dL (ref <150)

## 2020-12-20 LAB — CBC WITH DIFFERENTIAL/PLATELET
Absolute Monocytes: 311 cells/uL (ref 200–950)
Basophils Absolute: 31 cells/uL (ref 0–200)
Basophils Relative: 0.6 %
Eosinophils Absolute: 51 cells/uL (ref 15–500)
Eosinophils Relative: 1 %
HCT: 36.4 % (ref 35.0–45.0)
Hemoglobin: 11.6 g/dL — ABNORMAL LOW (ref 11.7–15.5)
Lymphs Abs: 1591 cells/uL (ref 850–3900)
MCH: 27 pg (ref 27.0–33.0)
MCHC: 31.9 g/dL — ABNORMAL LOW (ref 32.0–36.0)
MCV: 84.7 fL (ref 80.0–100.0)
MPV: 9.8 fL (ref 7.5–12.5)
Monocytes Relative: 6.1 %
Neutro Abs: 3116 cells/uL (ref 1500–7800)
Neutrophils Relative %: 61.1 %
Platelets: 226 10*3/uL (ref 140–400)
RBC: 4.3 10*6/uL (ref 3.80–5.10)
RDW: 14.2 % (ref 11.0–15.0)
Total Lymphocyte: 31.2 %
WBC: 5.1 10*3/uL (ref 3.8–10.8)

## 2020-12-20 LAB — COMPLETE METABOLIC PANEL WITH GFR
AG Ratio: 1.2 (calc) (ref 1.0–2.5)
ALT: 10 U/L (ref 6–29)
AST: 14 U/L (ref 10–30)
Albumin: 3.9 g/dL (ref 3.6–5.1)
Alkaline phosphatase (APISO): 83 U/L (ref 31–125)
BUN: 12 mg/dL (ref 7–25)
CO2: 29 mmol/L (ref 20–32)
Calcium: 9.2 mg/dL (ref 8.6–10.2)
Chloride: 103 mmol/L (ref 98–110)
Creat: 0.85 mg/dL (ref 0.50–0.99)
Globulin: 3.2 g/dL (calc) (ref 1.9–3.7)
Glucose, Bld: 80 mg/dL (ref 65–99)
Potassium: 3.5 mmol/L (ref 3.5–5.3)
Sodium: 140 mmol/L (ref 135–146)
Total Bilirubin: 0.4 mg/dL (ref 0.2–1.2)
Total Protein: 7.1 g/dL (ref 6.1–8.1)
eGFR: 87 mL/min/{1.73_m2} (ref >=60)

## 2020-12-20 LAB — HEMOGLOBIN A1C
Hgb A1c MFr Bld: 5.2 % of total Hgb (ref <5.7)
Mean Plasma Glucose: 103 mg/dL
eAG (mmol/L): 5.7 mmol/L

## 2020-12-20 LAB — VITAMIN D 25 HYDROXY (VIT D DEFICIENCY, FRACTURES): Vit D, 25-Hydroxy: 43 ng/mL (ref 30–100)

## 2020-12-21 NOTE — Assessment & Plan Note (Signed)
Chronic.  Blood pressure slightly elevated today in clinic, however remains at goal at home.  Encouraged patient to continue checking blood pressure at home-if running greater than 130/80 consistently, return to clinic.  We will check kidney function with electrolytes and follow-up in 4 months.

## 2020-12-21 NOTE — Assessment & Plan Note (Signed)
Chronic.  Patient currently taking BuSpar 10 mg twice daily and alprazolam 1 mg 3 times daily.  Pt is aware of risks of psychoactive medication use to include increased sedation, respiratory suppression, falls, extrapyramidal movements,  dependence and cardiovascular events.  Pt would like to continue treatment as benefit determined to outweigh risk.  She desires weaning down to alprazolam 0.5mg  daily prn in the future, however is not ready to start weaning process yet. Controlled substance agreement signed today.  UDS at next appointment. PDMP reviewed and appropriate-refills given for the next 4 months.  Plan to follow-up in 4 months.

## 2020-12-21 NOTE — Assessment & Plan Note (Signed)
Chronic.  This is previously been diet controlled.  We will check lipids today-patient is fasting.  Consider statin if ASCVD risk is greater than 7.5%.

## 2020-12-21 NOTE — Assessment & Plan Note (Signed)
Check vitamin D level today.  History of sleeve gastrectomy-currently taking bariatric vitamin with iron.

## 2020-12-21 NOTE — Assessment & Plan Note (Signed)
Check CBC, vitamin D, vitamin B12.  Currently taking bariatric vitamin with iron.

## 2020-12-26 ENCOUNTER — Ambulatory Visit: Payer: 59 | Admitting: Nurse Practitioner

## 2021-01-08 ENCOUNTER — Other Ambulatory Visit (HOSPITAL_COMMUNITY): Payer: Self-pay

## 2021-01-20 ENCOUNTER — Ambulatory Visit: Payer: 59 | Admitting: Podiatry

## 2021-01-22 ENCOUNTER — Ambulatory Visit (HOSPITAL_COMMUNITY)
Admission: RE | Admit: 2021-01-22 | Discharge: 2021-01-22 | Disposition: A | Discharge status: Home / Self Care | Payer: 59 | Source: Ambulatory Visit | Attending: Nurse Practitioner | Admitting: Nurse Practitioner

## 2021-01-22 ENCOUNTER — Other Ambulatory Visit: Payer: Self-pay

## 2021-01-22 DIAGNOSIS — Z1231 Encounter for screening mammogram for malignant neoplasm of breast: Secondary | ICD-10-CM | POA: Insufficient documentation

## 2021-02-06 ENCOUNTER — Other Ambulatory Visit (HOSPITAL_COMMUNITY): Payer: Self-pay

## 2021-03-10 ENCOUNTER — Other Ambulatory Visit (HOSPITAL_COMMUNITY): Payer: Self-pay

## 2021-03-30 ENCOUNTER — Other Ambulatory Visit (HOSPITAL_COMMUNITY): Payer: Self-pay

## 2021-04-09 ENCOUNTER — Other Ambulatory Visit (HOSPITAL_COMMUNITY): Payer: Self-pay

## 2021-04-23 ENCOUNTER — Ambulatory Visit (INDEPENDENT_AMBULATORY_CARE_PROVIDER_SITE_OTHER): Payer: 59 | Admitting: Nurse Practitioner

## 2021-04-23 ENCOUNTER — Other Ambulatory Visit (HOSPITAL_COMMUNITY): Payer: Self-pay

## 2021-04-23 ENCOUNTER — Other Ambulatory Visit: Payer: Self-pay

## 2021-04-23 ENCOUNTER — Encounter: Payer: Self-pay | Admitting: Nurse Practitioner

## 2021-04-23 VITALS — BP 130/80 | HR 78 | Temp 97.6°F | Resp 18 | Ht 65.0 in | Wt 217.0 lb

## 2021-04-23 DIAGNOSIS — Z9884 Bariatric surgery status: Secondary | ICD-10-CM | POA: Diagnosis not present

## 2021-04-23 DIAGNOSIS — Z1329 Encounter for screening for other suspected endocrine disorder: Secondary | ICD-10-CM

## 2021-04-23 DIAGNOSIS — I10 Essential (primary) hypertension: Secondary | ICD-10-CM | POA: Diagnosis not present

## 2021-04-23 DIAGNOSIS — F419 Anxiety disorder, unspecified: Secondary | ICD-10-CM | POA: Diagnosis not present

## 2021-04-23 MED ORDER — ESCITALOPRAM OXALATE 5 MG PO TABS
5.0000 mg | ORAL_TABLET | Freq: Every day | ORAL | 1 refills | Status: DC
  Filled 2021-04-23: qty 90, 90d supply, fill #0

## 2021-04-23 MED ORDER — ALPRAZOLAM 1 MG PO TABS
1.0000 mg | ORAL_TABLET | Freq: Three times a day (TID) | ORAL | 3 refills | Status: DC | PRN
Start: 2021-04-23 — End: 2021-09-07
  Filled 2021-04-23 – 2021-05-08 (×2): qty 90, 30d supply, fill #0
  Filled 2021-06-08: qty 90, 30d supply, fill #1
  Filled 2021-07-06: qty 90, 30d supply, fill #2
  Filled 2021-08-06: qty 90, 30d supply, fill #3

## 2021-04-23 MED ORDER — HYDROCHLOROTHIAZIDE 12.5 MG PO TABS
12.5000 mg | ORAL_TABLET | Freq: Every day | ORAL | 1 refills | Status: DC
Start: 2021-04-23 — End: 2021-12-18
  Filled 2021-04-23 – 2021-06-25 (×2): qty 90, 90d supply, fill #0
  Filled 2021-09-22: qty 90, 90d supply, fill #1

## 2021-04-23 NOTE — Assessment & Plan Note (Signed)
Chronic.  Initially, blood pressure slightly elevated today in clinic.  However, upon recheck, blood pressure at goal.  Also remains at goal at home.  Recent kidney function with electrolytes normal-plan to continue hydrochlorothiazide 12.5 mg daily for now.  Follow-up 4 months.

## 2021-04-23 NOTE — Assessment & Plan Note (Signed)
Chronic.  PHQ 9 and GAD-7 are both slightly elevated today, denies SI/HI.  Again, the patient acknowledges that alprazolam 3 times daily is not a long-term solution.  I am in agreement with this.  Since she does not tolerate BuSpar well, we will try low-dose Lexapro again.  Start Lexapro 5 mg daily.  Discussed side effects and maximum effectiveness might take around 6 weeks.  Follow-up for virtual visit 6 weeks.

## 2021-04-23 NOTE — Assessment & Plan Note (Signed)
Recent CBC's showed slightly low hemoglobin.  We will recheck today along with vitamin B12, folate, iron panel.  Currently taking bariatric vitamin with iron and vitamin B12 daily-continue this for now.

## 2021-04-23 NOTE — Progress Notes (Signed)
Subjective:    Patient ID: Michelle Wilson, female    DOB: 1976-02-01, 45 y.o.   MRN: [id number]  HPI: Michelle Wilson is a 45 y.o. female presenting for anxiety follow up.  Chief Complaint  Patient presents with   Follow-up   ANXIETY/STRESS Has not been taking Buspar, reports when she was taking consistently, she was getting headaches.  She is taking alprazolam 1 mg three times daily and acknowledges that she does not want to take this medication long term daily.  She reports work causes her a lot of stress and anxiety and if she could work from home full time, she does not think she would need alprazolam as frequently.  She has done some employee counseling.  She is interested in formal Counseling/Therapy.  She is willing to try an alternate daily medication. Duration: years  Depression screen [facility] 2/9 04/23/2021 09/24/2020 06/22/2019 12/19/2018 06/13/2018  Decreased Interest 2 0 0 0 0  Down, Depressed, Hopeless 1 0 1 0 0  PHQ - 2 Score 3 0 1 0 0  Altered sleeping 0 - 1 - -  Tired, decreased energy 2 - 1 - -  Change in appetite 0 - 0 - -  Feeling bad or failure about yourself  0 - 1 - -  Trouble concentrating 0 - 0 - -  Moving slowly or fidgety/restless 0 - 0 - -  Suicidal thoughts 0 - 0 - -  PHQ-9 Score 5 - 4 - -  Difficult doing work/chores - - Not difficult at all - -  Some recent data might be hidden    GAD 7 : Generalized Anxiety Score 04/23/2021 09/24/2020 06/23/2020  Nervous, Anxious, on Edge 2 3 3   Control/stop worrying 3 2 2   Worry too much - different things 2 2 3   Trouble relaxing 1 0 1  Restless 1 2 2   Easily annoyed or irritable 1 3 3   Afraid - awful might happen 0 1 2  Total GAD 7 Score 10 13 16   Anxiety Difficulty Somewhat difficult Somewhat difficult Somewhat difficult    ANEMIA History of gastric sleeve surgery.  Taking Bariatric vitamin twice daily.  Reports heavy menses during last appointment, curious about vitamin levels and blood counts.  Anemia  status: stable Compliance with treatment: excellent compliance Iron supplementation side effects: no Severity of anemia: mild Fatigue: yes Decreased exercise tolerance: no  Dyspnea on exertion: no Palpitations: no Bleeding: no Pica: no  HYPERTENSION Currently taking hydrochlorothiazide 12.5 mg daily. Hypertension status: controlled  BP monitoring frequency:  daily BP range: 120s over 70s Medication compliance: Excellent Aspirin: no Recurrent headaches: no Visual changes: no Palpitations: no Dyspnea: no Chest pain: no Lower extremity edema: no Dizzy/lightheaded: no  No Known Allergies  Outpatient Encounter Medications as of 04/23/2021  Medication Sig   Multiple Vitamins-Minerals (BARIATRIC MULTIVITAMINS/IRON PO) Take by mouth.   [DISCONTINUED] ALPRAZolam (XANAX) 1 MG tablet Take 1 tablet (1 mg total) by mouth 3 (three) times daily as needed for anxiety.   [DISCONTINUED] busPIRone (BUSPAR) 10 MG tablet Take 1 tablet (10 mg total) by mouth 2 (two) times daily.   [DISCONTINUED] hydrochlorothiazide (HYDRODIURIL) 12.5 MG tablet Take 1 tablet (12.5 mg total) by mouth daily.   ALPRAZolam (XANAX) 1 MG tablet Take 1 tablet (1 mg total) by mouth 3 (three) times daily as needed for anxiety.   escitalopram (LEXAPRO) 5 MG tablet Take 1 tablet (5 mg total) by mouth at bedtime.   hydrochlorothiazide (HYDRODIURIL) 12.5 MG tablet  Take 1 tablet (12.5 mg total) by mouth daily.   No facility-administered encounter medications on file as of 04/23/2021.    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 Interim medical history since our last visit reviewed.  Review of Systems Per HPI unless specifically indicated above     Objective:    BP 130/80 (BP Location: Left Arm, Patient Position: Sitting, Cuff Size: Large)    Pulse 78    Temp 97.6 F (36.4 C) (Temporal)    Resp 18    Ht 5\' 5"  (1.651 m)    Wt 217 lb (98.4 kg)    SpO2 98%    BMI 36.11 kg/m   Wt Readings from Last 3 Encounters:  04/23/21 217 lb (98.4 kg)  12/19/20 216 lb 9.6 oz (98.2 kg)  06/23/20 214 lb 12.8 oz (97.4 kg)    Physical Exam Vitals and nursing note reviewed.  Constitutional:      General: She is not in acute distress.    Appearance: Normal appearance. She is not toxic-appearing.  Cardiovascular:     Rate and Rhythm: Normal rate and regular rhythm.     Heart sounds: Normal heart sounds. No murmur heard. Pulmonary:     Effort: Pulmonary effort is normal. No respiratory distress.     Breath sounds: Normal breath sounds. No wheezing, rhonchi or rales.  Skin:    General: Skin is warm.     Coloration: Skin is not jaundiced or pale.     Findings: No erythema.  Neurological:     Mental Status: She is alert.  Psychiatric:        Attention and Perception: Attention normal.        Mood and Affect: Affect normal. Mood is anxious.        Behavior: Behavior normal.        Thought Content: Thought content normal.        Judgment: Judgment normal.       Assessment & Plan:   Problem List Items Addressed This Visit       Cardiovascular and Mediastinum   Essential hypertension, benign    Chronic.  Initially, blood pressure slightly elevated today in clinic.  However, upon recheck, blood pressure at goal.  Also remains at goal at home.  Recent kidney function with  electrolytes normal-plan to continue hydrochlorothiazide 12.5 mg daily for now.  Follow-up 4 months.      Relevant Medications   hydrochlorothiazide (HYDRODIURIL) 12.5 MG tablet     Other   S/P laparoscopic sleeve gastrectomy Dec 2017    Recent CBC's showed slightly low hemoglobin.  We will recheck today along with vitamin B12, folate, iron panel.  Currently taking bariatric vitamin with iron and vitamin B12 daily-continue this for now.      Relevant  Orders   CBC with Differential/Platelet   Iron, TIBC and Ferritin Panel   B12 and Folate Panel   Anxiety    Chronic.  PHQ 9 and GAD-7 are both slightly elevated today, denies SI/HI.  Again, the patient acknowledges that alprazolam 3 times daily is not a long-term solution.  I am in agreement with this.  Since she does not tolerate BuSpar well, we will try low-dose Lexapro again.  Start Lexapro 5 mg daily.  Discussed side effects and maximum effectiveness might take around 6 weeks.  Follow-up for virtual visit 6 weeks.      Relevant Medications   ALPRAZolam (XANAX) 1 MG tablet   escitalopram (LEXAPRO) 5 MG tablet   Other Visit Diagnoses     Screening for thyroid disorder    -  Primary   Relevant Orders   TSH        Follow up plan: Return in about 6 weeks (around 06/04/2021) for virtual mood f/u.

## 2021-04-23 NOTE — Patient Instructions (Signed)
Talk Space Better Help 

## 2021-04-24 LAB — CBC WITH DIFFERENTIAL/PLATELET
Absolute Monocytes: 364 cells/uL (ref 200–950)
Basophils Absolute: 28 cells/uL (ref 0–200)
Basophils Relative: 0.5 %
Eosinophils Absolute: 78 cells/uL (ref 15–500)
Eosinophils Relative: 1.4 %
HCT: 37.6 % (ref 35.0–45.0)
Hemoglobin: 12.3 g/dL (ref 11.7–15.5)
Lymphs Abs: 1473 cells/uL (ref 850–3900)
MCH: 27.8 pg (ref 27.0–33.0)
MCHC: 32.7 g/dL (ref 32.0–36.0)
MCV: 84.9 fL (ref 80.0–100.0)
MPV: 9.8 fL (ref 7.5–12.5)
Monocytes Relative: 6.5 %
Neutro Abs: 3657 cells/uL (ref 1500–7800)
Neutrophils Relative %: 65.3 %
Platelets: 266 10*3/uL (ref 140–400)
RBC: 4.43 10*6/uL (ref 3.80–5.10)
RDW: 13.1 % (ref 11.0–15.0)
Total Lymphocyte: 26.3 %
WBC: 5.6 10*3/uL (ref 3.8–10.8)

## 2021-04-24 LAB — B12 AND FOLATE PANEL
Folate: >24 ng/mL
Vitamin B-12: 1334 pg/mL — ABNORMAL HIGH (ref 200–1100)

## 2021-04-24 LAB — IRON,TIBC AND FERRITIN PANEL
%SAT: 43 % (calc) (ref 16–45)
Ferritin: 13 ng/mL — ABNORMAL LOW (ref 16–232)
Iron: 149 ug/dL (ref 40–190)
TIBC: 350 mcg/dL (calc) (ref 250–450)

## 2021-04-24 LAB — TSH: TSH: 0.84 mIU/L

## 2021-05-08 ENCOUNTER — Other Ambulatory Visit (HOSPITAL_COMMUNITY): Payer: Self-pay

## 2021-06-01 ENCOUNTER — Encounter: Payer: Self-pay | Admitting: Nurse Practitioner

## 2021-06-01 ENCOUNTER — Telehealth (INDEPENDENT_AMBULATORY_CARE_PROVIDER_SITE_OTHER): Payer: 59 | Admitting: Nurse Practitioner

## 2021-06-01 ENCOUNTER — Other Ambulatory Visit (HOSPITAL_COMMUNITY): Payer: Self-pay

## 2021-06-01 DIAGNOSIS — F419 Anxiety disorder, unspecified: Secondary | ICD-10-CM | POA: Diagnosis not present

## 2021-06-01 MED ORDER — ESCITALOPRAM OXALATE 5 MG PO TABS
5.0000 mg | ORAL_TABLET | Freq: Every day | ORAL | 1 refills | Status: DC
Start: 2021-06-01 — End: 2021-07-03
  Filled 2021-06-01: qty 90, 90d supply, fill #0

## 2021-06-01 NOTE — Progress Notes (Signed)
Subjective:    Patient ID: Michelle Wilson, female    DOB: Sep 13, 1975, 46 y.o.   MRN: [id number]  HPI: Michelle Wilson is a 46 y.o. female presenting virtually for mood follow up.  Chief Complaint  Patient presents with   Anxiety   ANXIETY/STRESS Reports anxiety and stress is much better with addition of Lexapro 5 mg daily.  She is now taking alprazolam 1 mg only once daily as needed.  She is able to balance the stress at work much better now and is very happy with the medication.  No Known Allergies  Outpatient Encounter Medications as of 06/01/2021  Medication Sig   ALPRAZolam (XANAX) 1 MG tablet Take 1 tablet (1 mg total) by mouth 3 (three) times daily as needed for anxiety.   hydrochlorothiazide (HYDRODIURIL) 12.5 MG tablet Take 1 tablet (12.5 mg total) by mouth daily.   Multiple Vitamins-Minerals (BARIATRIC MULTIVITAMINS/IRON PO) Take by mouth.   [DISCONTINUED] escitalopram (LEXAPRO) 5 MG tablet Take 1 tablet (5 mg total) by mouth at bedtime.   escitalopram (LEXAPRO) 5 MG tablet Take 1 tablet (5 mg total) by mouth at bedtime.   No facility-administered encounter medications on file as of 06/01/2021.    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 Interim medical history since our last visit reviewed.  Review of Systems Per HPI unless  specifically indicated above     Objective:    There were no vitals taken for this visit.  Wt Readings from Last 3 Encounters:  04/23/21 217 lb (98.4 kg)  12/19/20 216 lb 9.6 oz (98.2 kg)  06/23/20 214 lb 12.8 oz (97.4 kg)    Physical Exam Vitals and nursing note reviewed.  Constitutional:      General: She is not in acute distress.    Appearance: Normal appearance. She is not toxic-appearing.  Pulmonary:     Effort: Pulmonary effort is normal. No respiratory distress.  Skin:    Coloration: Skin is not jaundiced or pale.     Findings: No erythema.  Neurological:     Mental Status: She is alert and oriented to person, place, and time.  Psychiatric:        Mood and Affect: Mood normal.        Behavior: Behavior normal.        Thought Content: Thought content normal.        Judgment: Judgment normal.      Assessment & Plan:   Problem List Items Addressed This Visit       Other   Anxiety    Chronic.  Much improved with Lexapro 5 mg daily per patient.  GAD-7 and PHQ-9 not obtained today.  Continue Lexapro 5 mg daily and as needed alprazolam; can plan to continue to cut back on  alprazolam until using very sparingly.  Refills sent in for Lexapro 5 mg daily.      Relevant Medications   escitalopram (LEXAPRO) 5 MG tablet     Follow up plan: Return for with new PCP.  Due to the catastrophic nature of the COVID-19 pandemic, this video visit was completed soley via audio and visual contact via Caregility due to the restrictions of the COVID-19 pandemic.  All issues as above were discussed and addressed. Physical exam was done as above through visual confirmation on Caregility. If it was felt that the patient should be evaluated in the office, they were directed there. The patient verbally consented to this visit. Location of the patient: work Location of the provider: work Those involved with this call:  Provider: Noemi Chapel, DNP, FNP-C CMA: Elizabeth Palau, CMA Front  Desk/Registration: Vevelyn Pat  Time spent on call:  6 minutes with patient face to face via video conference. More than 50% of this time was spent in counseling and coordination of care. 12 minutes total spent in review of patient's record and preparation of their chart. I verified patient identity using two factors (patient name and date of birth). Patient consents verbally to being seen via telemedicine visit today.

## 2021-06-01 NOTE — Assessment & Plan Note (Signed)
Chronic.  Much improved with Lexapro 5 mg daily per patient.  GAD-7 and PHQ-9 not obtained today.  Continue Lexapro 5 mg daily and as needed alprazolam; can plan to continue to cut back on alprazolam until using very sparingly.  Refills sent in for Lexapro 5 mg daily.

## 2021-06-04 ENCOUNTER — Telehealth: Payer: 59 | Admitting: Nurse Practitioner

## 2021-06-08 ENCOUNTER — Other Ambulatory Visit (HOSPITAL_COMMUNITY): Payer: Self-pay

## 2021-06-25 ENCOUNTER — Other Ambulatory Visit (HOSPITAL_COMMUNITY): Payer: Self-pay

## 2021-07-03 ENCOUNTER — Telehealth (INDEPENDENT_AMBULATORY_CARE_PROVIDER_SITE_OTHER): Payer: 59 | Admitting: Family

## 2021-07-03 ENCOUNTER — Encounter: Payer: Self-pay | Admitting: Family

## 2021-07-03 ENCOUNTER — Other Ambulatory Visit: Payer: Self-pay

## 2021-07-03 VITALS — BP 110/76 | HR 80 | Wt 214.0 lb

## 2021-07-03 DIAGNOSIS — D508 Other iron deficiency anemias: Secondary | ICD-10-CM

## 2021-07-03 DIAGNOSIS — E559 Vitamin D deficiency, unspecified: Secondary | ICD-10-CM | POA: Diagnosis not present

## 2021-07-03 DIAGNOSIS — D509 Iron deficiency anemia, unspecified: Secondary | ICD-10-CM | POA: Diagnosis not present

## 2021-07-03 DIAGNOSIS — I10 Essential (primary) hypertension: Secondary | ICD-10-CM | POA: Diagnosis not present

## 2021-07-03 DIAGNOSIS — Z6835 Body mass index (BMI) 35.0-35.9, adult: Secondary | ICD-10-CM

## 2021-07-03 DIAGNOSIS — F411 Generalized anxiety disorder: Secondary | ICD-10-CM

## 2021-07-03 DIAGNOSIS — E785 Hyperlipidemia, unspecified: Secondary | ICD-10-CM

## 2021-07-03 LAB — CBC WITH DIFFERENTIAL/PLATELET
Basophils Absolute: 0 10*3/uL (ref 0.0–0.1)
Basophils Relative: 0.9 % (ref 0.0–3.0)
Eosinophils Absolute: 0 10*3/uL (ref 0.0–0.7)
Eosinophils Relative: 1.1 % (ref 0.0–5.0)
HCT: 37.6 % (ref 36.0–46.0)
Hemoglobin: 12.2 g/dL (ref 12.0–15.0)
Lymphocytes Relative: 32.7 % (ref 12.0–46.0)
Lymphs Abs: 1.4 10*3/uL (ref 0.7–4.0)
MCHC: 32.4 g/dL (ref 30.0–36.0)
MCV: 85.1 fl (ref 78.0–100.0)
Monocytes Absolute: 0.3 10*3/uL (ref 0.1–1.0)
Monocytes Relative: 5.8 % (ref 3.0–12.0)
Neutro Abs: 2.6 10*3/uL (ref 1.4–7.7)
Neutrophils Relative %: 59.5 % (ref 43.0–77.0)
Platelets: 272 10*3/uL (ref 150.0–400.0)
RBC: 4.42 Mil/uL (ref 3.87–5.11)
RDW: 14.3 % (ref 11.5–15.5)
WBC: 4.4 10*3/uL (ref 4.0–10.5)

## 2021-07-03 LAB — LIPID PANEL
Cholesterol: 198 mg/dL (ref 0–200)
HDL: 63.9 mg/dL (ref >39.00)
LDL Cholesterol: 123 mg/dL — ABNORMAL HIGH (ref 0–99)
NonHDL: 134.25
Total CHOL/HDL Ratio: 3
Triglycerides: 57 mg/dL (ref 0.0–149.0)
VLDL: 11.4 mg/dL (ref 0.0–40.0)

## 2021-07-03 LAB — COMPREHENSIVE METABOLIC PANEL
ALT: 11 U/L (ref 0–35)
AST: 15 U/L (ref 0–37)
Albumin: 3.9 g/dL (ref 3.5–5.2)
Alkaline Phosphatase: 85 U/L (ref 39–117)
BUN: 10 mg/dL (ref 6–23)
CO2: 32 mEq/L (ref 19–32)
Calcium: 9.1 mg/dL (ref 8.4–10.5)
Chloride: 102 mEq/L (ref 96–112)
Creatinine, Ser: 0.83 mg/dL (ref 0.40–1.20)
GFR: 85.3 mL/min (ref >60.00)
Glucose, Bld: 83 mg/dL (ref 70–99)
Potassium: 3.4 mEq/L — ABNORMAL LOW (ref 3.5–5.1)
Sodium: 141 mEq/L (ref 135–145)
Total Bilirubin: 0.7 mg/dL (ref 0.2–1.2)
Total Protein: 6.9 g/dL (ref 6.0–8.3)

## 2021-07-03 LAB — IBC + FERRITIN
Ferritin: 22 ng/mL (ref 10.0–291.0)
Iron: 95 ug/dL (ref 42–145)
Saturation Ratios: 30.8 % (ref 20.0–50.0)
TIBC: 308 ug/dL (ref 250.0–450.0)
Transferrin: 220 mg/dL (ref 212.0–360.0)

## 2021-07-03 NOTE — Assessment & Plan Note (Signed)
Pt advised of the following:  °Continue medication as prescribed. Monitor blood pressure periodically and/or when you feel symptomatic. Goal is <130/90 on average. Ensure that you have rested for 30 minutes prior to checking your blood pressure. Record your readings and bring them to your next visit if necessary.work on a low sodium diet. ° °

## 2021-07-03 NOTE — Assessment & Plan Note (Signed)
Stable last lab draw.  ?

## 2021-07-03 NOTE — Assessment & Plan Note (Signed)
Continue iron supplement  ?Will order iron levels cbc pending results ?

## 2021-07-03 NOTE — Assessment & Plan Note (Signed)
Increase lexapro to 10 mg once daily.  ?Work on Biochemist, clinical, handout sent to [employer]. Suggested therapy, pt looking into virtual options.  ?

## 2021-07-03 NOTE — Assessment & Plan Note (Signed)
Work on diet and exercise as tolerated  ?

## 2021-07-03 NOTE — Assessment & Plan Note (Signed)
Ordered lipid panel, pending results. Work on low cholesterol diet and exercise as tolerated ? ?

## 2021-07-03 NOTE — Progress Notes (Addendum)
New Patient video Visit  MyChart Video Visit    Virtual Visit via Video Note   This visit type was conducted due to national recommendations for restrictions regarding the COVID-19 Pandemic (e.g. social distancing) in an effort to limit this patient's exposure and mitigate transmission in our community. This patient is at least at moderate risk for complications without adequate follow up. This format is felt to be most appropriate for this patient at this time. Physical exam was limited by quality of the video and audio technology used for the visit. CMA was able to get the patient set up on a video visit.  Patient location: Home. Patient and provider in visit Provider location: Office  I discussed the limitations of evaluation and management by telemedicine and the availability of in person appointments. The patient expressed understanding and agreed to proceed.  Visit Date: 07/03/2021  Today's healthcare provider: Eugenia Pancoast, FNP   Subjective:  Patient ID: Michelle Wilson, female    DOB: Apr 11, 1976  Age: 46 y.o. MRN: [id number]  CC:  Chief Complaint  Patient presents with   Establish Care    HPI Michelle Wilson is here to establish care as a new patient.  Prior provider was: Noemi Chapel, NP Pt is without acute concerns.   Colonoscopy, she is about to schedule an appt with GI.   chronic concerns:  IDA: last ferritin was 13. Since then has started taking daily iron. Menses have started to recently become irregular every 1-2 months. Does have heavy periods which likely is causative of her low iron. In perimenopausal stage.   GAD: lexapro 5 mg once daily, is currently still using xanax 1-2 tablets once daily. Not currently seeing a therapist.   Pap smear: 12/19/2018 Mammo: 01/22/2021  S/p gastric sleeve - takes bariatric advantage which has a lot of supplements to include b12   Past Medical History:  Diagnosis Date   Anemia    Anxiety    GERD  (gastroesophageal reflux disease)    pregnancy   Hypertension    Aldomet 250 mg Q day   IBS (irritable bowel syndrome)    no issues currently   Infection    Yeast inf; no frequent    Past Surgical History:  Procedure Laterality Date   BILATERAL SALPINGECTOMY  05/31/2012   Procedure: BILATERAL SALPINGECTOMY;  Surgeon: Eldred Manges, MD;  Location: [facility];  Service: Obstetrics;  Laterality: Bilateral;   BREAST REDUCTION SURGERY  1997   CESAREAN SECTION  2011   CESAREAN SECTION  05/31/2012   Procedure: CESAREAN SECTION;  Surgeon: Eldred Manges, MD;  Location: [facility];  Service: Obstetrics;  Laterality: N/A;   LAPAROSCOPIC GASTRIC SLEEVE RESECTION N/A 04/12/2016   Procedure: LAPAROSCOPIC GASTRIC SLEEVE RESECTION, UPPER ENDO;  Surgeon: Johnathan Hausen, MD;  Location: [facility];  Service: General;  Laterality: N/A;   REDUCTION MAMMAPLASTY Bilateral     Family History  Problem Relation Age of Onset   Hypertension Mother    Heart disease Mother 20       Heart attack   Hyperlipidemia Mother    Hypertension Father    Hypertension Maternal Grandmother    Hypertension Maternal Grandfather    Stroke Maternal Grandfather    Hypertension Paternal Grandmother    Hypertension Paternal Grandfather    Hyperthyroidism Maternal Aunt        x 3   Schizophrenia Maternal Aunt    Cancer Maternal Uncle        Lung deceased  Cancer Maternal Uncle        Lung Cancer     Social History   Socioeconomic History   Marital status: Married    Spouse name: Ryanna Teschner   Number of children: 1   Years of education: Not on file   Highest education level: Not on file  Occupational History   Occupation: Glass blower/designer  Tobacco Use   Smoking status: Never    Passive exposure: Past   Smokeless tobacco: Never  Substance and Sexual Activity   Alcohol use: No    Comment: Occasionally, not while pregnant   Drug use: No   Sexual activity: Not Currently    Partners: Male    Birth  control/protection: Surgical    Comment: Tubes Removed   Other Topics Concern   Not on file  Social History Narrative   Not on file   Social Determinants of Health   Financial Resource Strain: Not on file  Food Insecurity: Not on file  Transportation Needs: Not on file  Physical Activity: Not on file  Stress: Not on file  Social Connections: Not on file  Intimate Partner Violence: Not on file    Outpatient Medications Prior to Visit  Medication Sig Dispense Refill   ALPRAZolam (XANAX) 1 MG tablet Take 1 tablet (1 mg total) by mouth 3 (three) times daily as needed for anxiety. 90 tablet 3   escitalopram (LEXAPRO) 10 MG tablet Take 10 mg by mouth daily.     hydrochlorothiazide (HYDRODIURIL) 12.5 MG tablet Take 1 tablet (12.5 mg total) by mouth daily. 90 tablet 1   Multiple Vitamins-Minerals (BARIATRIC MULTIVITAMINS/IRON PO) Take by mouth.     escitalopram (LEXAPRO) 5 MG tablet Take 1 tablet (5 mg total) by mouth at bedtime. 90 tablet 1   No facility-administered medications prior to visit.    No Known Allergies  ROS Review of Systems  Review of Systems  Respiratory:  Negative for shortness of breath.   Cardiovascular:  Negative for chest pain and palpitations.  Gastrointestinal:  Negative for constipation and diarrhea.  Genitourinary:  Negative for dysuria, frequency and urgency.  Musculoskeletal:  Negative for myalgias.  Psychiatric/Behavioral:  Negative for depression and suicidal ideas.   All other systems reviewed and are negative.    Objective:    Physical Exam  Gen: NAD, resting comfortably Psych: Normal affect and thought content  BP 110/76    Pulse 80    Wt 214 lb (97.1 kg)    BMI 35.61 kg/m  Wt Readings from Last 3 Encounters:  07/03/21 214 lb (97.1 kg)  04/23/21 217 lb (98.4 kg)  12/19/20 216 lb 9.6 oz (98.2 kg)     Health Maintenance Due  Topic Date Due   COVID-19 Vaccine (3 - Booster for Moderna series) 06/05/2020   COLONOSCOPY (Pts 45-66yr  Insurance coverage will need to be confirmed)  Never done    There are no preventive care reminders to display for this patient.  Lab Results  Component Value Date   TSH 0.84 04/23/2021   Lab Results  Component Value Date   WBC 5.6 04/23/2021   HGB 12.3 04/23/2021   HCT 37.6 04/23/2021   MCV 84.9 04/23/2021   PLT 266 04/23/2021   Lab Results  Component Value Date   NA 140 12/19/2020   K 3.5 12/19/2020   CO2 29 12/19/2020   GLUCOSE 80 12/19/2020   BUN 12 12/19/2020   CREATININE 0.85 12/19/2020   BILITOT 0.4 12/19/2020   ALKPHOS 78 08/31/2016  AST 14 12/19/2020   ALT 10 12/19/2020   PROT 7.1 12/19/2020   ALBUMIN 3.8 08/31/2016   CALCIUM 9.2 12/19/2020   ANIONGAP 10 04/08/2016   EGFR 87 12/19/2020   Lab Results  Component Value Date   CHOL 185 12/19/2020   Lab Results  Component Value Date   HDL 60 12/19/2020   Lab Results  Component Value Date   LDLCALC 111 (H) 12/19/2020   Lab Results  Component Value Date   TRIG 47 12/19/2020   Lab Results  Component Value Date   CHOLHDL 3.1 12/19/2020   Lab Results  Component Value Date   HGBA1C 5.2 12/19/2020      Assessment & Plan:   Problem List Items Addressed This Visit       Cardiovascular and Mediastinum   Essential hypertension, benign    Pt advised of the following:  Continue medication as prescribed. Monitor blood pressure periodically and/or when you feel symptomatic. Goal is <130/90 on average. Ensure that you have rested for 30 minutes prior to checking your blood pressure. Record your readings and bring them to your next visit if necessary.work on a low sodium diet.       Relevant Orders   Comprehensive metabolic panel     Other   BMI 35.0-35.9,adult    Work on diet and exercise as tolerated      Mild hyperlipidemia    Ordered lipid panel, pending results. Work on low cholesterol diet and exercise as tolerated       Relevant Orders   Lipid panel   Vitamin D deficiency    Stable  last lab draw.       GAD (generalized anxiety disorder)    Increase lexapro to 10 mg once daily.  Work on Engineer, site, handout sent to [employer]. Suggested therapy, pt looking into virtual options.       Relevant Medications   escitalopram (LEXAPRO) 10 MG tablet   Iron deficiency anemia secondary to inadequate dietary iron intake    Continue iron supplement  Will order iron levels cbc pending results      RESOLVED: Anemia - Primary   Relevant Orders   CBC with Differential   IBC + Ferritin    No orders of the defined types were placed in this encounter.   Follow-up: Return in about 6 months (around 01/03/2022) for schedule pap/cpe visit.  I discussed the assessment and treatment plan with the patient. The patient was provided an opportunity to ask questions and all were answered. The patient agreed with the plan and demonstrated an understanding of the instructions.   The patient was advised to call back or seek an in-person evaluation if the symptoms worsen or if the condition fails to improve as anticipated.  I provided 38 minutes of face-to-face time during this encounter.    Eugenia Pancoast, FNP

## 2021-07-06 ENCOUNTER — Encounter: Payer: Self-pay | Admitting: Family

## 2021-07-06 ENCOUNTER — Other Ambulatory Visit (HOSPITAL_COMMUNITY): Payer: Self-pay

## 2021-07-06 DIAGNOSIS — F411 Generalized anxiety disorder: Secondary | ICD-10-CM

## 2021-07-06 MED ORDER — ESCITALOPRAM OXALATE 10 MG PO TABS
10.0000 mg | ORAL_TABLET | Freq: Every day | ORAL | 0 refills | Status: DC
  Filled 2021-07-06: qty 90, 90d supply, fill #0

## 2021-08-06 ENCOUNTER — Other Ambulatory Visit (HOSPITAL_COMMUNITY): Payer: Self-pay

## 2021-08-31 ENCOUNTER — Other Ambulatory Visit: Payer: Self-pay | Admitting: Family

## 2021-08-31 ENCOUNTER — Other Ambulatory Visit: Payer: Self-pay | Admitting: Nurse Practitioner

## 2021-08-31 ENCOUNTER — Other Ambulatory Visit (HOSPITAL_COMMUNITY): Payer: Self-pay

## 2021-08-31 DIAGNOSIS — F419 Anxiety disorder, unspecified: Secondary | ICD-10-CM

## 2021-09-01 ENCOUNTER — Other Ambulatory Visit (HOSPITAL_COMMUNITY): Payer: Self-pay

## 2021-09-01 ENCOUNTER — Other Ambulatory Visit: Payer: Self-pay | Admitting: Family

## 2021-09-01 ENCOUNTER — Encounter: Payer: Self-pay | Admitting: Family

## 2021-09-01 DIAGNOSIS — F419 Anxiety disorder, unspecified: Secondary | ICD-10-CM

## 2021-09-02 ENCOUNTER — Other Ambulatory Visit (HOSPITAL_COMMUNITY): Payer: Self-pay

## 2021-09-02 ENCOUNTER — Other Ambulatory Visit: Payer: Self-pay | Admitting: Family

## 2021-09-02 DIAGNOSIS — F419 Anxiety disorder, unspecified: Secondary | ICD-10-CM

## 2021-09-04 ENCOUNTER — Other Ambulatory Visit (HOSPITAL_COMMUNITY): Payer: Self-pay

## 2021-09-07 ENCOUNTER — Other Ambulatory Visit (HOSPITAL_COMMUNITY): Payer: Self-pay

## 2021-09-07 ENCOUNTER — Encounter: Payer: Self-pay | Admitting: Family

## 2021-09-07 ENCOUNTER — Other Ambulatory Visit: Payer: Self-pay | Admitting: Family

## 2021-09-07 DIAGNOSIS — F419 Anxiety disorder, unspecified: Secondary | ICD-10-CM

## 2021-09-07 MED ORDER — ALPRAZOLAM 1 MG PO TABS
1.0000 mg | ORAL_TABLET | Freq: Two times a day (BID) | ORAL | 0 refills | Status: DC | PRN
  Filled 2021-09-07: qty 60, 30d supply, fill #0

## 2021-09-21 ENCOUNTER — Encounter: Payer: Self-pay | Admitting: Family

## 2021-09-21 DIAGNOSIS — F411 Generalized anxiety disorder: Secondary | ICD-10-CM

## 2021-09-22 ENCOUNTER — Other Ambulatory Visit (HOSPITAL_COMMUNITY): Payer: Self-pay

## 2021-09-22 MED ORDER — ESCITALOPRAM OXALATE 20 MG PO TABS
20.0000 mg | ORAL_TABLET | Freq: Every day | ORAL | 1 refills | Status: DC
  Filled 2021-09-22: qty 90, 90d supply, fill #0
  Filled 2021-12-16: qty 90, 90d supply, fill #1

## 2021-10-12 ENCOUNTER — Other Ambulatory Visit (HOSPITAL_COMMUNITY): Payer: Self-pay

## 2021-10-12 ENCOUNTER — Other Ambulatory Visit: Payer: Self-pay | Admitting: Family

## 2021-10-12 DIAGNOSIS — F419 Anxiety disorder, unspecified: Secondary | ICD-10-CM

## 2021-10-13 ENCOUNTER — Other Ambulatory Visit (HOSPITAL_COMMUNITY): Payer: Self-pay

## 2021-10-14 NOTE — Telephone Encounter (Signed)
Called patient and scheduled her on 6.26.2023. Please advise when possible, thanks.  Callback Number: [phone]

## 2021-10-16 ENCOUNTER — Other Ambulatory Visit (HOSPITAL_COMMUNITY): Payer: Self-pay

## 2021-10-19 ENCOUNTER — Ambulatory Visit: Payer: 59 | Admitting: Family

## 2021-10-19 ENCOUNTER — Other Ambulatory Visit (HOSPITAL_COMMUNITY): Payer: Self-pay

## 2021-10-19 ENCOUNTER — Encounter: Payer: Self-pay | Admitting: Family

## 2021-10-19 VITALS — BP 130/78 | HR 74 | Temp 99.3°F | Resp 16 | Ht 65.0 in | Wt 212.2 lb

## 2021-10-19 DIAGNOSIS — J309 Allergic rhinitis, unspecified: Secondary | ICD-10-CM | POA: Insufficient documentation

## 2021-10-19 DIAGNOSIS — F419 Anxiety disorder, unspecified: Secondary | ICD-10-CM

## 2021-10-19 DIAGNOSIS — F411 Generalized anxiety disorder: Secondary | ICD-10-CM

## 2021-10-19 MED ORDER — HYDROXYZINE PAMOATE 50 MG PO CAPS
50.0000 mg | ORAL_CAPSULE | Freq: Every evening | ORAL | 0 refills | Status: DC | PRN
  Filled 2021-10-19: qty 30, 30d supply, fill #0

## 2021-10-19 MED ORDER — ALPRAZOLAM 1 MG PO TABS
0.5000 mg | ORAL_TABLET | Freq: Every day | ORAL | 0 refills | Status: DC
  Filled 2021-10-19: qty 30, 30d supply, fill #0

## 2021-11-12 ENCOUNTER — Encounter (HOSPITAL_COMMUNITY): Payer: Self-pay | Admitting: *Deleted

## 2021-11-18 ENCOUNTER — Other Ambulatory Visit: Payer: Self-pay | Admitting: Family

## 2021-11-18 ENCOUNTER — Other Ambulatory Visit (HOSPITAL_COMMUNITY): Payer: Self-pay

## 2021-11-18 DIAGNOSIS — F419 Anxiety disorder, unspecified: Secondary | ICD-10-CM

## 2021-11-19 ENCOUNTER — Other Ambulatory Visit (HOSPITAL_COMMUNITY): Payer: Self-pay

## 2021-11-19 ENCOUNTER — Other Ambulatory Visit: Payer: Self-pay | Admitting: Family

## 2021-11-19 DIAGNOSIS — F419 Anxiety disorder, unspecified: Secondary | ICD-10-CM

## 2021-11-19 MED ORDER — ALPRAZOLAM 1 MG PO TABS
0.5000 mg | ORAL_TABLET | Freq: Every day | ORAL | 0 refills | Status: DC
  Filled 2021-11-19: qty 30, 30d supply, fill #0

## 2021-12-03 ENCOUNTER — Ambulatory Visit: Payer: 59 | Admitting: Behavioral Health

## 2021-12-09 ENCOUNTER — Encounter: Payer: Self-pay | Admitting: Family

## 2021-12-09 ENCOUNTER — Other Ambulatory Visit (HOSPITAL_COMMUNITY): Payer: Self-pay | Admitting: Family

## 2021-12-09 DIAGNOSIS — Z1231 Encounter for screening mammogram for malignant neoplasm of breast: Secondary | ICD-10-CM

## 2021-12-09 NOTE — Telephone Encounter (Signed)
Err

## 2021-12-16 ENCOUNTER — Other Ambulatory Visit (HOSPITAL_COMMUNITY): Payer: Self-pay

## 2021-12-18 ENCOUNTER — Other Ambulatory Visit: Payer: Self-pay | Admitting: Nurse Practitioner

## 2021-12-18 DIAGNOSIS — I10 Essential (primary) hypertension: Secondary | ICD-10-CM

## 2021-12-21 ENCOUNTER — Other Ambulatory Visit (HOSPITAL_COMMUNITY): Payer: Self-pay

## 2021-12-21 ENCOUNTER — Encounter: Payer: 59 | Admitting: Family

## 2021-12-21 ENCOUNTER — Telehealth: Payer: Self-pay | Admitting: Family

## 2021-12-21 ENCOUNTER — Other Ambulatory Visit: Payer: Self-pay | Admitting: Family

## 2021-12-21 DIAGNOSIS — F419 Anxiety disorder, unspecified: Secondary | ICD-10-CM

## 2021-12-21 MED ORDER — ALPRAZOLAM 1 MG PO TABS
0.5000 mg | ORAL_TABLET | Freq: Every day | ORAL | 0 refills | Status: DC
  Filled 2021-12-21: qty 30, 30d supply, fill #0

## 2021-12-21 MED ORDER — HYDROCHLOROTHIAZIDE 12.5 MG PO TABS
12.5000 mg | ORAL_TABLET | Freq: Every day | ORAL | 1 refills | Status: DC
  Filled 2021-12-21: qty 90, 90d supply, fill #0

## 2021-12-21 NOTE — Telephone Encounter (Signed)
  Encourage patient to contact the pharmacy for refills or they can request refills through [facility]  LAST APPOINTMENT DATE:  Please schedule appointment if longer than 1 year  NEXT APPOINTMENT DATE:  MEDICATION:ALPRAZolam (XANAX) 1 MG tablet hydrochlorothiazide (HYDRODIURIL) 12.5 MG tablet Is the patient out of medication?   PHARMACY:[facility]  Let patient know to contact pharmacy at the end of the day to make sure medication is ready.  Please notify patient to allow 48-72 hours to process  CLINICAL FILLS OUT ALL BELOW:   LAST REFILL:  QTY:   REFILL DATE:    OTHER COMMENTS:    Okay for refill?  Please advise

## 2021-12-22 ENCOUNTER — Other Ambulatory Visit (HOSPITAL_COMMUNITY): Payer: Self-pay

## 2021-12-22 NOTE — Telephone Encounter (Signed)
Refill sent.

## 2021-12-24 ENCOUNTER — Encounter: Payer: Self-pay | Admitting: Family

## 2021-12-24 ENCOUNTER — Ambulatory Visit (INDEPENDENT_AMBULATORY_CARE_PROVIDER_SITE_OTHER): Payer: 59 | Admitting: Family

## 2021-12-24 ENCOUNTER — Ambulatory Visit (INDEPENDENT_AMBULATORY_CARE_PROVIDER_SITE_OTHER): Payer: 59 | Admitting: Behavioral Health

## 2021-12-24 VITALS — BP 132/84 | HR 64 | Temp 98.2°F | Resp 16 | Ht 65.0 in | Wt 216.1 lb

## 2021-12-24 DIAGNOSIS — E559 Vitamin D deficiency, unspecified: Secondary | ICD-10-CM

## 2021-12-24 DIAGNOSIS — E785 Hyperlipidemia, unspecified: Secondary | ICD-10-CM

## 2021-12-24 DIAGNOSIS — I10 Essential (primary) hypertension: Secondary | ICD-10-CM | POA: Diagnosis not present

## 2021-12-24 DIAGNOSIS — E876 Hypokalemia: Secondary | ICD-10-CM | POA: Diagnosis not present

## 2021-12-24 DIAGNOSIS — Z6835 Body mass index (BMI) 35.0-35.9, adult: Secondary | ICD-10-CM

## 2021-12-24 DIAGNOSIS — D509 Iron deficiency anemia, unspecified: Secondary | ICD-10-CM | POA: Diagnosis not present

## 2021-12-24 DIAGNOSIS — Z0000 Encounter for general adult medical examination without abnormal findings: Secondary | ICD-10-CM | POA: Insufficient documentation

## 2021-12-24 DIAGNOSIS — F4323 Adjustment disorder with mixed anxiety and depressed mood: Secondary | ICD-10-CM

## 2021-12-24 DIAGNOSIS — F411 Generalized anxiety disorder: Secondary | ICD-10-CM | POA: Diagnosis not present

## 2021-12-24 LAB — COMPREHENSIVE METABOLIC PANEL
ALT: 12 U/L (ref 0–35)
AST: 18 U/L (ref 0–37)
Albumin: 3.7 g/dL (ref 3.5–5.2)
Alkaline Phosphatase: 80 U/L (ref 39–117)
BUN: 12 mg/dL (ref 6–23)
CO2: 29 mEq/L (ref 19–32)
Calcium: 8.8 mg/dL (ref 8.4–10.5)
Chloride: 101 mEq/L (ref 96–112)
Creatinine, Ser: 0.73 mg/dL (ref 0.40–1.20)
GFR: 99.18 mL/min (ref >60.00)
Glucose, Bld: 77 mg/dL (ref 70–99)
Potassium: 3.1 mEq/L — ABNORMAL LOW (ref 3.5–5.1)
Sodium: 137 mEq/L (ref 135–145)
Total Bilirubin: 0.5 mg/dL (ref 0.2–1.2)
Total Protein: 7.3 g/dL (ref 6.0–8.3)

## 2021-12-24 LAB — CBC
HCT: 34.1 % — ABNORMAL LOW (ref 36.0–46.0)
Hemoglobin: 11.4 g/dL — ABNORMAL LOW (ref 12.0–15.0)
MCHC: 33.5 g/dL (ref 30.0–36.0)
MCV: 84.9 fl (ref 78.0–100.0)
Platelets: 243 10*3/uL (ref 150.0–400.0)
RBC: 4.01 Mil/uL (ref 3.87–5.11)
RDW: 13.7 % (ref 11.5–15.5)
WBC: 4.6 10*3/uL (ref 4.0–10.5)

## 2021-12-24 LAB — VITAMIN D 25 HYDROXY (VIT D DEFICIENCY, FRACTURES): VITD: 36.48 ng/mL (ref 30.00–100.00)

## 2021-12-24 LAB — LIPID PANEL
Cholesterol: 187 mg/dL (ref 0–200)
HDL: 62.4 mg/dL (ref >39.00)
LDL Cholesterol: 114 mg/dL — ABNORMAL HIGH (ref 0–99)
NonHDL: 124.58
Total CHOL/HDL Ratio: 3
Triglycerides: 51 mg/dL (ref 0.0–149.0)
VLDL: 10.2 mg/dL (ref 0.0–40.0)

## 2021-12-24 NOTE — Assessment & Plan Note (Signed)
Patient Counseling(The following topics were reviewed): ? Preventative care handout given to pt  ?Health maintenance and immunizations reviewed. Please refer to Health maintenance section. ?Pt advised on safe sex, wearing seatbelts in car, and proper nutrition ?labwork ordered today for annual ?Dental health: Discussed importance of regular tooth brushing, flossing, and dental visits. ? ? ?

## 2021-12-24 NOTE — Progress Notes (Signed)
Established Patient Office Visit  Subjective:  Patient ID: Michelle Wilson, female    DOB: [birth year]  Age: 46 y.o. MRN: [id number]  CC:  Chief Complaint  Patient presents with  . Annual Exam    HPI Michelle Wilson is here for annual physical exam.   Pap 04/15/20, negative Mammogram, scheduled for October  Colonoscopy, plans to schedule already ordered  Flu vaccine: 10/22, wants to wait to have flu clinic at her job.   Periods, irregular. Currently with her period at this time. This is since starting perimenopause in the last one year or so.   chronic concerns:  Anxiety: doing ok on lexapro 20 mg once daily , doing much better with this however with a lot of stressors of recent. Xanax 0.5 mg twice daily, pretty consistent with this. New appt today with therapy.     10/19/2021    4:27 PM 07/03/2021    7:44 AM 04/23/2021    9:14 AM  PHQ9 SCORE ONLY  PHQ-9 Total Score 3 4 5       10/19/2021    4:27 PM 04/23/2021    9:14 AM 09/24/2020    8:23 AM 06/23/2020    8:23 AM  GAD 7 : Generalized Anxiety Score  Nervous, Anxious, on Edge 0 2 3 3   Control/stop worrying 0 3 2 2   Worry too much - different things 0 2 2 3   Trouble relaxing 0 1 0 1  Restless 0 1 2 2   Easily annoyed or irritable 0 1 3 3   Afraid - awful might happen 0 0 1 2  Total GAD 7 Score 0 10 13 16   Anxiety Difficulty Not difficult at all Somewhat difficult Somewhat difficult Somewhat difficult      Past Medical History:  Diagnosis Date  . Anemia   . Anxiety   . GERD (gastroesophageal reflux disease)    pregnancy  . Hypertension    Aldomet 250 mg Q day  . IBS (irritable bowel syndrome)    no issues currently  . Infection    Yeast inf; no frequent    Past Surgical History:  Procedure Laterality Date  . BILATERAL SALPINGECTOMY  05/31/2012   Procedure: BILATERAL SALPINGECTOMY;  Surgeon: Eldred Manges, MD;  Location: [facility];  Service: Obstetrics;  Laterality: Bilateral;  . BREAST REDUCTION SURGERY   1997  . CESAREAN SECTION  2011  . CESAREAN SECTION  05/31/2012   Procedure: CESAREAN SECTION;  Surgeon: Eldred Manges, MD;  Location: [facility];  Service: Obstetrics;  Laterality: N/A;  . LAPAROSCOPIC GASTRIC SLEEVE RESECTION N/A 04/12/2016   Procedure: LAPAROSCOPIC GASTRIC SLEEVE RESECTION, UPPER ENDO;  Surgeon: Johnathan Hausen, MD;  Location: [facility];  Service: General;  Laterality: N/A;  . REDUCTION MAMMAPLASTY Bilateral     Family History  Problem Relation Age of Onset  . Hypertension Mother   . Heart disease Mother 87       Heart attack  . Hyperlipidemia Mother   . Hypertension Father   . Pancreatitis Father   . Hypertension Maternal Grandmother   . Hypertension Maternal Grandfather   . Stroke Maternal Grandfather   . Hypertension Paternal Grandmother   . Hypertension Paternal Grandfather   . Hyperthyroidism Maternal Aunt        x 3  . Schizophrenia Maternal Aunt   . Cancer Maternal Uncle        Lung deceased  . Cancer Maternal Uncle        Lung Cancer  Social History   Socioeconomic History  . Marital status: Married    Spouse name: Dallis Darden  . Number of children: 1  . Years of education: Not on file  . Highest education level: Not on file  Occupational History  . 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 Smoking status: Never    Passive exposure: Past  . Smokeless tobacco: Never  Vaping Use  . Vaping Use: Never used  Substance and Sexual Activity  . Alcohol use: No  . Drug use: No  . Sexual activity: Yes    Partners: Male    Birth control/protection: Surgical    Comment: Tubes Removed   Other Topics Concern  . Not on file  Social History Narrative  . Not on file   Social Determinants of Health   Financial Resource Strain: Not on file  Food Insecurity: Not on file  Transportation Needs: Not on file  Physical Activity: Not on file  Stress: Not on file  Social Connections:  Not on file  Intimate Partner Violence: Not on file    Outpatient Medications Prior to Visit  Medication Sig Dispense Refill  . ALPRAZolam (XANAX) 1 MG tablet Take 0.5-1 tablets (0.5-1 mg total) by mouth daily. 30 tablet 0  . escitalopram (LEXAPRO) 20 MG tablet Take 1 tablet (20 mg total) by mouth daily. 90 tablet 1  . hydrochlorothiazide (HYDRODIURIL) 12.5 MG tablet Take 1 tablet (12.5 mg total) by mouth daily. 90 tablet 1  . hydrOXYzine (VISTARIL) 50 MG capsule Take 1 capsule (50 mg total) by mouth at bedtime as needed for anxiety 30 capsule 0  . Multiple Vitamins-Minerals (BARIATRIC MULTIVITAMINS/IRON PO) Take by mouth.     No facility-administered medications prior to visit.    No Known Allergies  ROS Review of Systems  Review of Systems  Respiratory:  Negative for shortness of breath.   Cardiovascular:  Negative for chest pain and palpitations.  Gastrointestinal:  Negative for constipation and diarrhea.  Genitourinary:  Negative for dysuria, frequency and urgency.  Musculoskeletal:  Negative for myalgias.  Psychiatric/Behavioral:  Negative for depression and suicidal ideas.   All other systems reviewed and are negative.    Objective:    Physical Exam Vitals reviewed.  Constitutional:      General: She is not in acute distress.    Appearance: Normal appearance. She is not ill-appearing or toxic-appearing.  HENT:     Right Ear: Tympanic membrane normal.     Left Ear: Tympanic membrane normal.     Mouth/Throat:     Mouth: Mucous membranes are moist.     Pharynx: No pharyngeal swelling.     Tonsils: No tonsillar exudate.  Eyes:     Extraocular Movements: Extraocular movements intact.     Conjunctiva/sclera: Conjunctivae normal.     Pupils: Pupils are equal, round, and reactive to light.  Neck:     Thyroid: No thyroid mass.  Cardiovascular:     Rate and Rhythm: Normal rate and regular rhythm.  Pulmonary:     Effort: Pulmonary effort is normal.     Breath sounds:  Normal breath sounds.  Abdominal:     General: Abdomen is flat. Bowel sounds are normal.     Palpations: Abdomen is soft.  Musculoskeletal:        General: Normal range of motion.  Lymphadenopathy:     Cervical:     Right cervical: No superficial cervical adenopathy.  Left cervical: No superficial cervical adenopathy.  Skin:    General: Skin is warm.     Capillary Refill: Capillary refill takes less than 2 seconds.  Neurological:     General: No focal deficit present.     Mental Status: She is alert and oriented to person, place, and time.  Psychiatric:        Mood and Affect: Mood normal.        Behavior: Behavior normal.        Thought Content: Thought content normal.        Judgment: Judgment normal.     BP 132/84   Pulse 64   Temp 98.2 F (36.8 C)   Resp 16   Ht 5' 5"  (1.651 m)   Wt 216 lb 2 oz (98 kg)   LMP 12/19/2021   SpO2 98%   BMI 35.97 kg/m  Wt Readings from Last 3 Encounters:  12/24/21 216 lb 2 oz (98 kg)  10/19/21 212 lb 4 oz (96.3 kg)  07/03/21 214 lb (97.1 kg)     Health Maintenance Due  Topic Date Due  . COLONOSCOPY (Pts 45-60yr Insurance coverage will need to be confirmed)  Never done  . INFLUENZA VACCINE  11/24/2021    There are no preventive care reminders to display for this patient.  Lab Results  Component Value Date   TSH 0.84 04/23/2021   Lab Results  Component Value Date   WBC 4.4 07/03/2021   HGB 12.2 07/03/2021   HCT 37.6 07/03/2021   MCV 85.1 07/03/2021   PLT 272.0 07/03/2021   Lab Results  Component Value Date   NA 141 07/03/2021   K 3.4 (L) 07/03/2021   CO2 32 07/03/2021   GLUCOSE 83 07/03/2021   BUN 10 07/03/2021   CREATININE 0.83 07/03/2021   BILITOT 0.7 07/03/2021   ALKPHOS 85 07/03/2021   AST 15 07/03/2021   ALT 11 07/03/2021   PROT 6.9 07/03/2021   ALBUMIN 3.9 07/03/2021   CALCIUM 9.1 07/03/2021   ANIONGAP 10 04/08/2016   EGFR 87 12/19/2020   GFR 85.30 07/03/2021   Lab Results  Component Value Date    CHOL 198 07/03/2021   Lab Results  Component Value Date   HDL 63.90 07/03/2021   Lab Results  Component Value Date   LDLCALC 123 (H) 07/03/2021   Lab Results  Component Value Date   TRIG 57.0 07/03/2021   Lab Results  Component Value Date   CHOLHDL 3 07/03/2021   Lab Results  Component Value Date   HGBA1C 5.2 12/19/2020      Assessment & Plan:   Problem List Items Addressed This Visit       Cardiovascular and Mediastinum   Essential hypertension, benign    Continue hctz 12.5 mg once daily.  Pt advised of the following:  Continue medication as prescribed. Monitor blood pressure periodically and/or when you feel symptomatic. Goal is <130/90 on average. Ensure that you have rested for 30 minutes prior to checking your blood pressure. Record your readings and bring them to your next visit if necessary.work on a low sodium diet.         Other   BMI 35.0-35.9,adult    Work on diet exercise as tolerated      Mild hyperlipidemia    Ordered lipid panel, pending results. Work on low cholesterol diet and exercise as tolerated       Relevant Orders   Lipid panel   Comprehensive metabolic panel  Vitamin D deficiency    Ordered vitamin d pending results.        Relevant Orders   VITAMIN D 25 Hydroxy (Vit-D Deficiency, Fractures)   GAD (generalized anxiety disorder)    Continue lexapro 20 mg once daily.  Advised pt to continue with anxiety reducing techniques. See therapist as scheduled.       Anxiety state    pdmp reviewed Pt to continue with xanax 0.5 mg bid prn anxiety. Still will work on decreasing as able.  Keep appt with therapist as scheduled for today.  Continue lexapro 20 mg.       Encounter for general adult medical examination without abnormal findings - Primary    Patient Counseling(The following topics were reviewed):  Preventative care handout given to pt  Health maintenance and immunizations reviewed. Please refer to Health maintenance  section. Pt advised on safe sex, wearing seatbelts in car, and proper nutrition labwork ordered today for annual Dental health: Discussed importance of regular tooth brushing, flossing, and dental visits.        Relevant Orders   Lipid panel   Comprehensive metabolic panel   Hypokalemia    repeat potassium today, pending results.       Iron deficiency anemia    Continue mvi with iron supplement. Order cbc today pending results      Relevant Orders   CBC    No orders of the defined types were placed in this encounter.   Follow-up: Return in about 6 months (around 06/24/2022) for regular follow up appt.    Eugenia Pancoast, FNP

## 2021-12-24 NOTE — Assessment & Plan Note (Signed)
Work on diet exercise as tolerated

## 2021-12-24 NOTE — Progress Notes (Signed)
                Michelle Wilson L Jonica Bickhart, LMFT 

## 2021-12-24 NOTE — Assessment & Plan Note (Signed)
Continue lexapro 20 mg once daily.  Advised pt to continue with anxiety reducing techniques. See therapist as scheduled.

## 2021-12-24 NOTE — Assessment & Plan Note (Signed)
Continue hctz 12.5 mg once daily.  Pt advised of the following:  Continue medication as prescribed. Monitor blood pressure periodically and/or when you feel symptomatic. Goal is <130/90 on average. Ensure that you have rested for 30 minutes prior to checking your blood pressure. Record your readings and bring them to your next visit if necessary.work on a low sodium diet.

## 2021-12-24 NOTE — Assessment & Plan Note (Signed)
Ordered vitamin d pending results.   

## 2021-12-24 NOTE — Assessment & Plan Note (Signed)
repeat potassium today, pending results.

## 2021-12-24 NOTE — Patient Instructions (Signed)
  Stop by the lab prior to leaving today. I will notify you of your results once received.   Recommendations on keeping yourself healthy:  - Exercise at least 30-45 minutes a day, 3-4 days a week.  - Eat a low-fat diet with lots of fruits and vegetables, up to 7-9 servings per day.  - Seatbelts can save your life. Wear them always.  - Smoke detectors on every level of your home, check batteries every year.  - Eye Doctor - have an eye exam every 1-2 years  - Safe sex - if you may be exposed to STDs, use a condom.  - Alcohol -  If you drink, do it moderately, less than 2 drinks per day.  - Health Care Power of Attorney. Choose someone to speak for you if you are not able.  - Depression is common in our stressful world.If you're feeling down or losing interest in things you normally enjoy, please come in for a visit.  - Violence - If anyone is threatening or hurting you, please call immediately.  Due to recent changes in healthcare laws, you may see results of your imaging and/or laboratory studies on MyChart before I have had a chance to review them.  I understand that in some cases there may be results that are confusing or concerning to you. Please understand that not all results are received at the same time and often I may need to interpret multiple results in order to provide you with the best plan of care or course of treatment. Therefore, I ask that you please give me 2 business days to thoroughly review all your results before contacting my office for clarification. Should we see a critical lab result, you will be contacted sooner.   I will see you again in one year for your annual comprehensive exam unless otherwise stated and or with acute concerns.  It was a pleasure seeing you today! Please do not hesitate to reach out with any questions and or concerns.  Regards,   Tamberly Pomplun    

## 2021-12-24 NOTE — Assessment & Plan Note (Signed)
Continue mvi with iron supplement. Order cbc today pending results

## 2021-12-24 NOTE — Progress Notes (Addendum)
[facility] Behavioral Health Counselor Initial Adult Exam  Name: Michelle Wilson Date: 12/24/2021 MRN: [id number] DOB: 29-Sep-1975 PCP: Mort Sawyers, FNP  Time spent: 60 min Caregility video; Pt is home in private/Provider is located @ [facility] Office on Caregility video  Guardian/Payee:  Self    Paperwork requested: No   Reason for Visit /Presenting Problem: Pt feels reduction in anx, but notices some Sx of dep. Pt medication (Lexapro 20mg ) is, "really working"  Mental Status Exam: Appearance:   NA     Behavior:  Appropriate and Sharing  Motor:  NA  Speech/Language:   Normal Rate  Affect:  Appropriate  Mood:  normal  Thought process:  normal  Thought content:    WNL  Sensory/Perceptual disturbances:    WNL  Orientation:  oriented to person, place, and time/date  Attention:  Good  Concentration:  Good  Memory:  WNL  Fund of knowledge:   Good  Insight:    Good  Judgment:   Good  Impulse Control:  Good    Reported Symptoms:  Pt has noticed some mood swings, poor sleep, & her medication has started to work well in the past few wks  Risk Assessment: Danger to Self:  No Self-injurious Behavior: No Danger to Others: No Duty to Warn:no Physical Aggression / Violence:No  Access to Firearms a concern: No  Gang Involvement:No  Patient / guardian was educated about steps to take if suicide or homicide risk level increases between visits: no While future psychiatric events cannot be accurately predicted, the patient does not currently require acute inpatient psychiatric care and does not currently meet [facility] involuntary commitment criteria.  Substance Abuse History: Current substance abuse: No     Past Psychiatric History:   No previous psychological problems have been observed Outpatient Providers:Tabitha Dugal, FNP History of Psych Hospitalization: No  Psychological Testing:  NA    Abuse History:  Victim of: No.,  NA    Report needed: No. Victim of  Neglect:No. Perpetrator of  NA   Witness / Exposure to Domestic Violence: No   Protective Services Involvement: No  Witness to [facility] Violence:  No   Family History:  Family History  Problem Relation Age of Onset   Hypertension Mother    Heart disease Mother 9       Heart attack   Hyperlipidemia Mother    Hypertension Father    Pancreatitis Father    Hypertension Maternal Grandmother    Hypertension Maternal Grandfather    Stroke Maternal Grandfather    Hypertension Paternal Grandmother    Hypertension Paternal Grandfather    Hyperthyroidism Maternal Aunt        x 3   Schizophrenia Maternal Aunt    Cancer Maternal Uncle        Lung deceased   Cancer Maternal Uncle        Lung Cancer     Living situation: the patient lives with their family  Sexual Orientation: Straight  Relationship Status: married  Name of spouse 41 If a parent, number of children / ages:12yo Michelle Wilson & Doctor, general practice  Support Systems: spouse, Family, friends, Co-Workers  Sports coach Stress:  No   Income/Employment/Disability: Employment as Surveyor, quantity for Higher education careers adviser Service: No   Educational History: Education: college graduate  Religion/Sprituality/World View: Unk  Any cultural differences that may affect / interfere with treatment:  None noted today  Recreation/Hobbies: Exercise, massage & attn to self-care needs  Stressors: Other: acclimating to peri-menopause & the  changes it brings    Strengths: Supportive Relationships, Family, Friends, Journalist, newspaper, and Able to Communicate Effectively  Barriers:  None noted today   Legal History: Pending legal issue / charges: The patient has no significant history of legal issues. History of legal issue / charges:  No  Medical History/Surgical History: reviewed Past Medical History:  Diagnosis Date   Anemia    Anxiety    GERD (gastroesophageal reflux disease)    pregnancy   Hypertension    Aldomet 250 mg Q day   IBS  (irritable bowel syndrome)    no issues currently   Infection    Yeast inf; no frequent    Past Surgical History:  Procedure Laterality Date   BILATERAL SALPINGECTOMY  05/31/2012   Procedure: BILATERAL SALPINGECTOMY;  Surgeon: Hal Morales, MD;  Location: [facility];  Service: Obstetrics;  Laterality: Bilateral;   BREAST REDUCTION SURGERY  1997   CESAREAN SECTION  2011   CESAREAN SECTION  05/31/2012   Procedure: CESAREAN SECTION;  Surgeon: Hal Morales, MD;  Location: [facility];  Service: Obstetrics;  Laterality: N/A;   LAPAROSCOPIC GASTRIC SLEEVE RESECTION N/A 04/12/2016   Procedure: LAPAROSCOPIC GASTRIC SLEEVE RESECTION, UPPER ENDO;  Surgeon: Luretha Murphy, MD;  Location: [facility];  Service: General;  Laterality: N/A;   REDUCTION MAMMAPLASTY Bilateral     Medications: Current Outpatient Medications  Medication Sig Dispense Refill   ALPRAZolam (XANAX) 1 MG tablet Take 0.5-1 tablets (0.5-1 mg total) by mouth daily. 30 tablet 0   escitalopram (LEXAPRO) 20 MG tablet Take 1 tablet (20 mg total) by mouth daily. 90 tablet 1   hydrochlorothiazide (HYDRODIURIL) 12.5 MG tablet Take 1 tablet (12.5 mg total) by mouth daily. 90 tablet 1   hydrOXYzine (VISTARIL) 50 MG capsule Take 1 capsule (50 mg total) by mouth at bedtime as needed for anxiety 30 capsule 0   Multiple Vitamins-Minerals (BARIATRIC MULTIVITAMINS/IRON PO) Take by mouth.     No current facility-administered medications for this visit.    No Known Allergies  Diagnoses:  Adjustment reaction with anxiety and depression  Plan of Care: ST: Provide Pt with any requested information & establish w/Pt for her future psychotherapy needs prn  LT: Pt will contact for appt prn   Deneise Lever, LMFT

## 2021-12-24 NOTE — Assessment & Plan Note (Signed)
Ordered lipid panel, pending results. Work on low cholesterol diet and exercise as tolerated ? ?

## 2021-12-24 NOTE — Assessment & Plan Note (Signed)
pdmp reviewed Pt to continue with xanax 0.5 mg bid prn anxiety. Still will work on decreasing as able.  Keep appt with therapist as scheduled for today.  Continue lexapro 20 mg.

## 2021-12-25 NOTE — Progress Notes (Signed)
Continue to work on low-cholesterol diet LDL is 114 our desire is to be less than 70... Any chance that patient would be willing to start on Lipitor such as a cholesterol medication?  If and that she has high blood pressure she is already an increased risk for heart disease this would lessen the risk  Potassium did decrease from 5 months ago, this is likely from daily use of hydrochlorothiazide.  I do think that we should consider another medication for blood pressure such as losartan .  This would not decrease the potassium is patient willing?  Also with slight worsening anemia if she currently taking iron supplement if not I would restart or take daily if tolerated  Vitamin D okay

## 2022-01-04 ENCOUNTER — Encounter: Payer: Self-pay | Admitting: Family

## 2022-01-04 ENCOUNTER — Other Ambulatory Visit (HOSPITAL_COMMUNITY): Payer: Self-pay

## 2022-01-04 ENCOUNTER — Other Ambulatory Visit: Payer: Self-pay | Admitting: Family

## 2022-01-04 DIAGNOSIS — E876 Hypokalemia: Secondary | ICD-10-CM

## 2022-01-04 DIAGNOSIS — E785 Hyperlipidemia, unspecified: Secondary | ICD-10-CM

## 2022-01-04 MED ORDER — POTASSIUM CHLORIDE CRYS ER 20 MEQ PO TBCR
EXTENDED_RELEASE_TABLET | ORAL | 0 refills | Status: DC
  Filled 2022-01-04: qty 45, 37d supply, fill #0

## 2022-01-04 MED ORDER — ATORVASTATIN CALCIUM 10 MG PO TABS
10.0000 mg | ORAL_TABLET | Freq: Every day | ORAL | 3 refills | Status: DC
  Filled 2022-01-04: qty 39, 39d supply, fill #0
  Filled 2022-01-04: qty 90, 90d supply, fill #0
  Filled 2022-04-15 – 2022-04-16 (×2): qty 90, 90d supply, fill #1
  Filled 2022-07-16: qty 90, 90d supply, fill #2
  Filled 2022-10-14: qty 90, 90d supply, fill #3

## 2022-01-04 NOTE — Progress Notes (Signed)
Michelle Wilson you can disregard my follow-up question as patient reached out to me on MyChart message.  No need to call her at this time I just spoke with her on 9/11 at 1153.

## 2022-01-04 NOTE — Progress Notes (Signed)
Informed pt and lab appointment made.

## 2022-01-04 NOTE — Telephone Encounter (Signed)
Called pt and set up lab appointment.

## 2022-01-04 NOTE — Progress Notes (Unsigned)
I am sending in potassium supplementation for 20 mEq that she will take twice daily for 7 days, and then decrease to once daily thereafter.  Have her make a lab only appointment in 2 weeks to repeat her potassium level.

## 2022-01-15 ENCOUNTER — Ambulatory Visit: Payer: 59 | Admitting: Family

## 2022-01-19 ENCOUNTER — Other Ambulatory Visit (HOSPITAL_COMMUNITY): Payer: Self-pay

## 2022-01-19 ENCOUNTER — Other Ambulatory Visit: Payer: Self-pay | Admitting: Family

## 2022-01-19 DIAGNOSIS — F419 Anxiety disorder, unspecified: Secondary | ICD-10-CM

## 2022-01-19 MED ORDER — ALPRAZOLAM 1 MG PO TABS
0.5000 mg | ORAL_TABLET | Freq: Every day | ORAL | 0 refills | Status: DC
  Filled 2022-01-19: qty 30, 30d supply, fill #0

## 2022-01-22 ENCOUNTER — Other Ambulatory Visit (INDEPENDENT_AMBULATORY_CARE_PROVIDER_SITE_OTHER): Payer: 59

## 2022-01-22 DIAGNOSIS — E876 Hypokalemia: Secondary | ICD-10-CM

## 2022-01-22 NOTE — Addendum Note (Signed)
Addended by: Carter Kitten on: 01/22/2022 04:10 PM   Modules accepted: Orders

## 2022-01-22 NOTE — Addendum Note (Signed)
Addended by: Ellamae Sia on: 01/22/2022 04:20 PM   Modules accepted: Orders

## 2022-01-23 LAB — POTASSIUM: Potassium: 4 mmol/L (ref 3.5–5.3)

## 2022-01-25 ENCOUNTER — Ambulatory Visit (HOSPITAL_COMMUNITY)
Admission: RE | Admit: 2022-01-25 | Discharge: 2022-01-25 | Disposition: A | Discharge status: Home / Self Care | Payer: 59 | Source: Ambulatory Visit | Attending: Family | Admitting: Family

## 2022-01-25 DIAGNOSIS — Z1231 Encounter for screening mammogram for malignant neoplasm of breast: Secondary | ICD-10-CM | POA: Insufficient documentation

## 2022-01-30 IMAGING — MG MM DIGITAL SCREENING BILAT W/ TOMO AND CAD
8 series · 8 of 24 positions shown · non-contrast
Comparison: Previous exam(s).

CLINICAL DATA: Screening.

EXAM:
DIGITAL SCREENING BILATERAL MAMMOGRAM WITH TOMOSYNTHESIS AND CAD
TECHNIQUE: Bilateral screening digital craniocaudal and mediolateral oblique
mammograms were obtained. Bilateral screening digital breast
tomosynthesis was performed. The images were evaluated with
computer-aided detection.

[L MLO synth-2D]
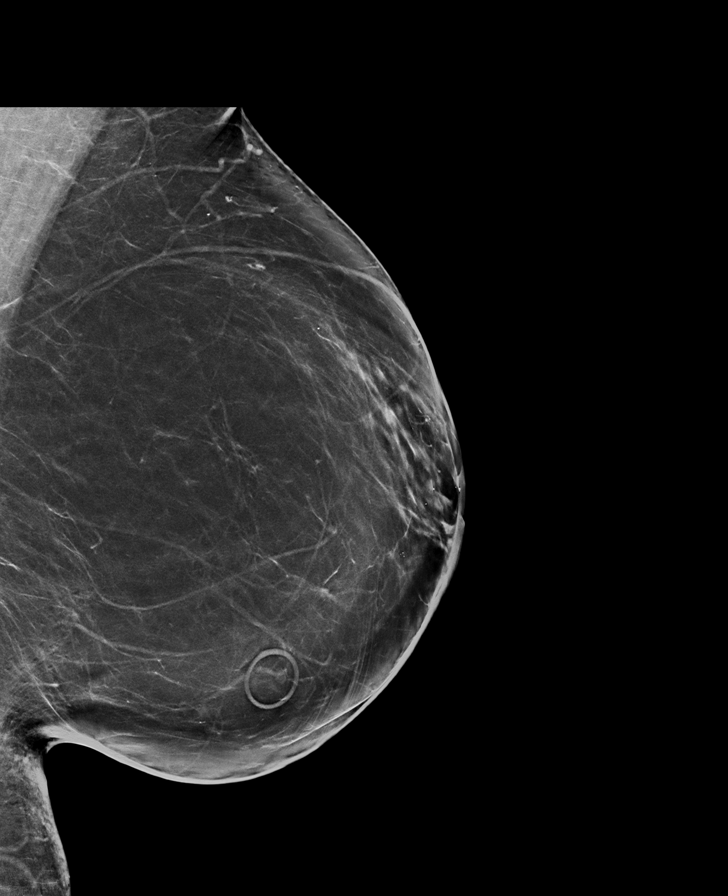

[R MLO synth-2D]
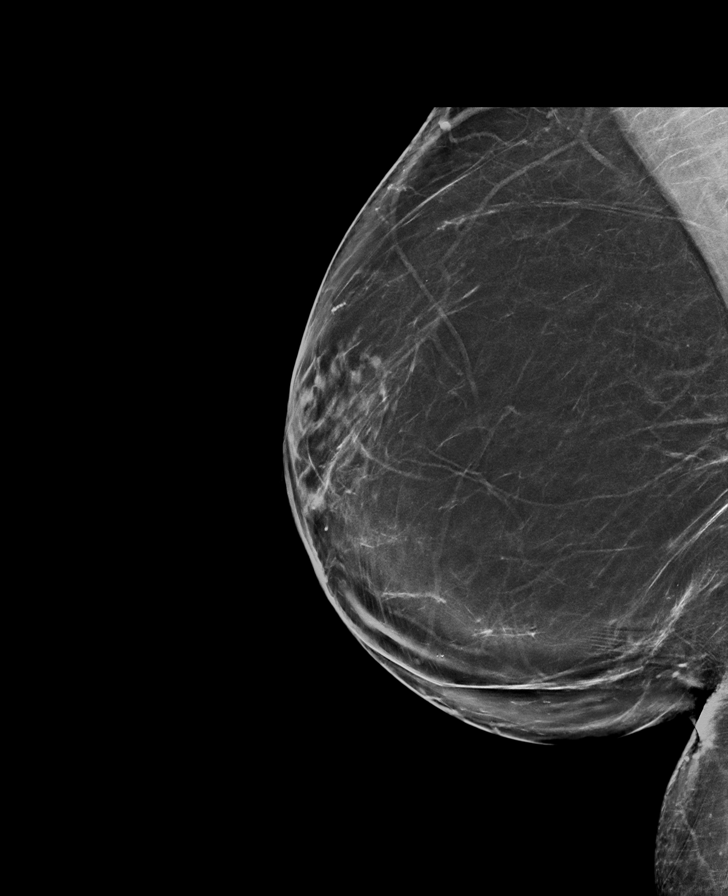

[L CC synth-2D]
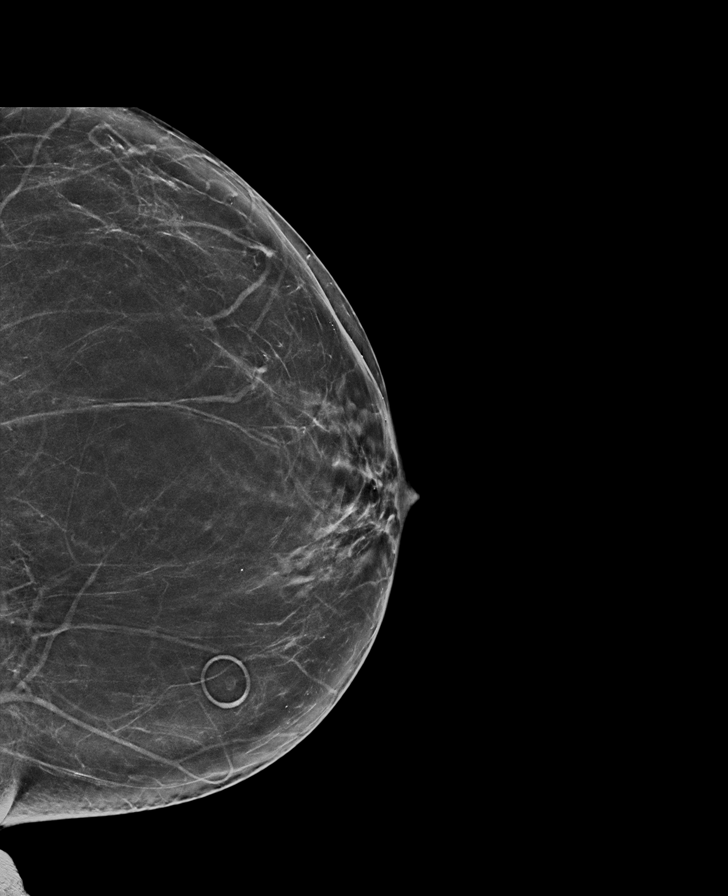

[R CC synth-2D]
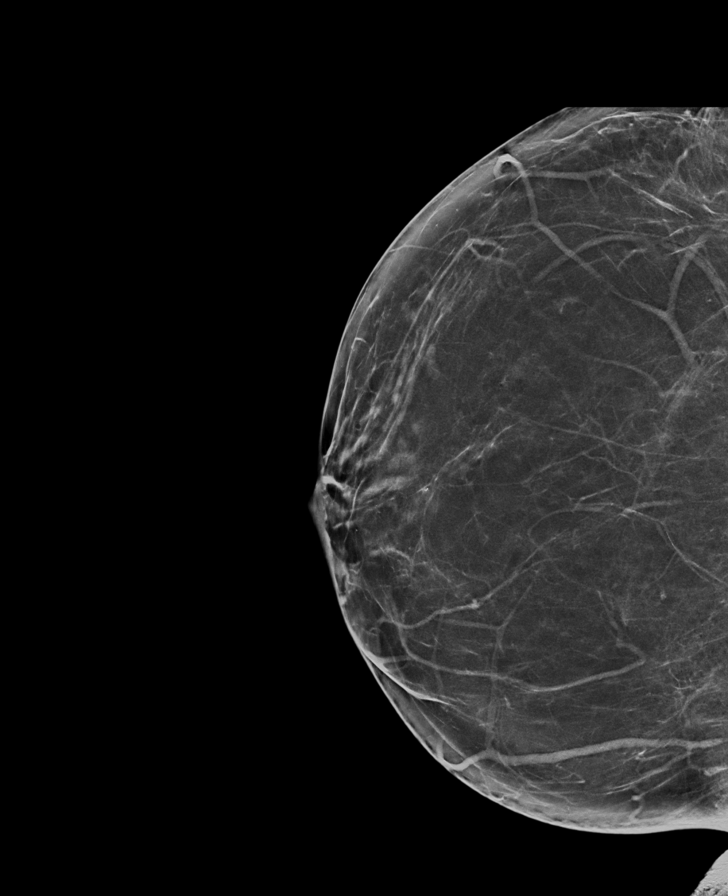

[R CC tomo · tomo slice 35/69.0]
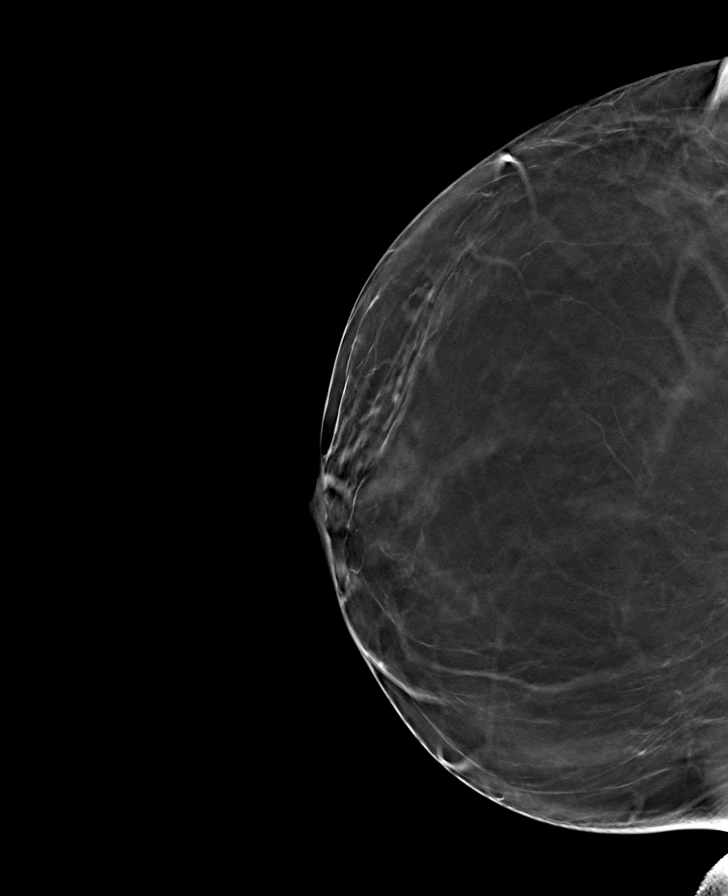

[L CC tomo · tomo slice 35/68.0]
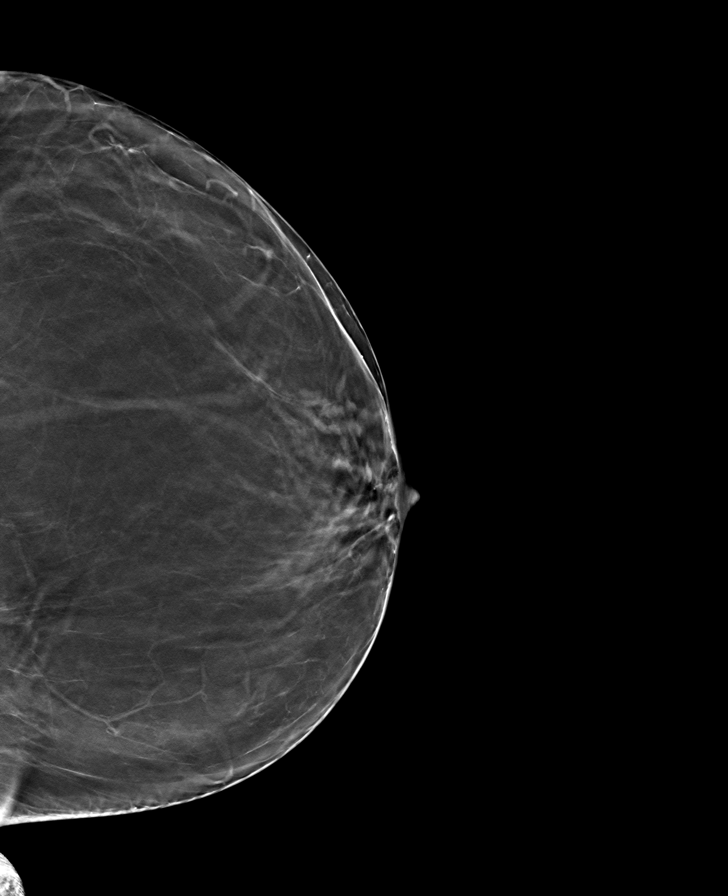

[R MLO tomo · tomo slice 41/82.0]
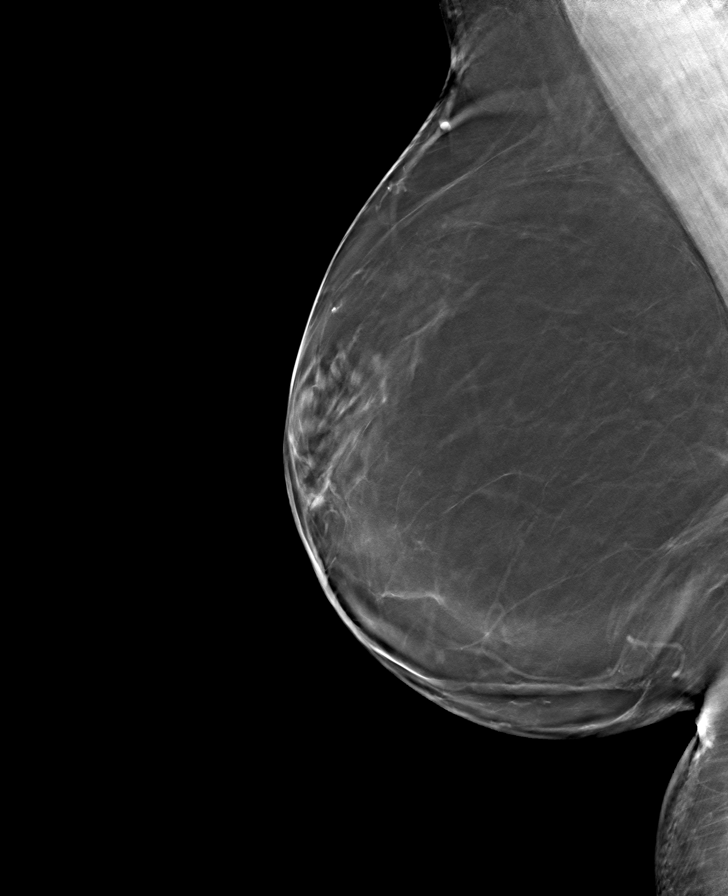

[L MLO tomo · tomo slice 41/82.0]
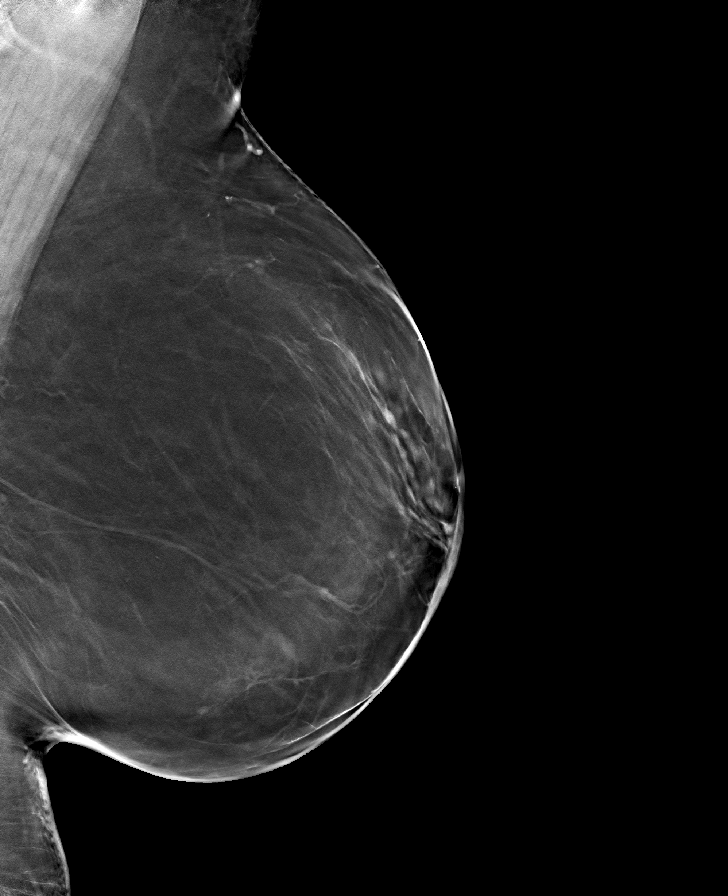

[8 of 24 positions shown; findings below may reference images not displayed]

ACR Breast Density Category b: There are scattered areas of
fibroglandular density.
FINDINGS: There are no findings suspicious for malignancy.
IMPRESSION: No mammographic evidence of malignancy. A result letter of this
screening mammogram will be mailed directly to the patient.

RECOMMENDATION:
Screening mammogram in one year. (Code:51-O-LD2)

BI-RADS CATEGORY  1: Negative.

## 2022-02-03 ENCOUNTER — Other Ambulatory Visit: Payer: Self-pay | Admitting: Family

## 2022-02-03 ENCOUNTER — Other Ambulatory Visit (HOSPITAL_COMMUNITY): Payer: Self-pay

## 2022-02-03 DIAGNOSIS — E876 Hypokalemia: Secondary | ICD-10-CM

## 2022-02-05 ENCOUNTER — Encounter: Payer: Self-pay | Admitting: Family

## 2022-02-05 NOTE — Telephone Encounter (Signed)
Recheck of labs 9/29 was normal level ok to let patient know ok to d/c potassium? Do you want to remove from med list?

## 2022-02-10 ENCOUNTER — Other Ambulatory Visit: Payer: Self-pay | Admitting: Family

## 2022-02-10 DIAGNOSIS — E876 Hypokalemia: Secondary | ICD-10-CM

## 2022-02-12 ENCOUNTER — Telehealth: Payer: 59 | Admitting: Family Medicine

## 2022-02-12 DIAGNOSIS — N3000 Acute cystitis without hematuria: Secondary | ICD-10-CM

## 2022-02-12 MED ORDER — NITROFURANTOIN MONOHYD MACRO 100 MG PO CAPS: 100.0000 mg | ORAL_CAPSULE | Freq: Two times a day (BID) | ORAL | 0 refills | Status: DC

## 2022-02-12 NOTE — Progress Notes (Signed)
Virtual Visit Consent   Michelle Wilson, you are scheduled for a virtual visit with a [facility] provider today. Just as with appointments in the office, your consent must be obtained to participate. Your consent will be active for this visit and any virtual visit you may have with one of our providers in the next 365 days. If you have a MyChart account, a copy of this consent can be sent to you electronically.  As this is a virtual visit, video technology does not allow for your provider to perform a traditional examination. This may limit your provider's ability to fully assess your condition. If your provider identifies any concerns that need to be evaluated in person or the need to arrange testing (such as labs, EKG, etc.), we will make arrangements to do so. Although advances in technology are sophisticated, we cannot ensure that it will always work on either your end or our end. If the connection with a video visit is poor, the visit may have to be switched to a telephone visit. With either a video or telephone visit, we are not always able to ensure that we have a secure connection.  By engaging in this virtual visit, you consent to the provision of healthcare and authorize for your insurance to be billed (if applicable) for the services provided during this visit. Depending on your insurance coverage, you may receive a charge related to this service.  I need to obtain your verbal consent now. Are you willing to proceed with your visit today? Michelle Wilson has provided verbal consent on 02/12/2022 for a virtual visit (video or telephone). Dellia Nims, FNP  Date: 02/12/2022 8:33 AM  Virtual Visit via Video Note   I, Dellia Nims, connected with  Michelle Wilson  ([id number], 02/01/1976) on 02/12/22 at  8:30 AM EDT by a video-enabled telemedicine application and verified that I am speaking with the correct person using two identifiers.  Location: Patient: Virtual Visit Location  Patient: Home Provider: Virtual Visit Location Provider: Home Office   I discussed the limitations of evaluation and management by telemedicine and the availability of in person appointments. The patient expressed understanding and agreed to proceed.    History of Present Illness: Michelle Wilson is a 46 y.o. who identifies as a female who was assigned female at birth, and is being seen today for urinary frequency, urgency, trickling output, no fever or back pain. No recurrent history. 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   Essential hypertension, benign 08/19/2011   BMI 35.0-35.9,adult 08/19/2011    Allergies: No Known Allergies Medications:  Current Outpatient Medications:    nitrofurantoin, macrocrystal-monohydrate, (MACROBID) 100 MG capsule, Take 1 capsule (100 mg total) by mouth 2 (two) times daily for 5 days., Disp: 10 capsule, Rfl: 0   ALPRAZolam (XANAX) 1 MG tablet, Take 0.5-1 tablets (0.5-1 mg total) by mouth daily., Disp: 30 tablet, Rfl: 0   atorvastatin (LIPITOR) 10 MG tablet, Take 1 tablet (10 mg total) by mouth daily., Disp: 90 tablet, Rfl: 3   escitalopram (LEXAPRO) 20 MG tablet, Take 1 tablet (20 mg  total) by mouth daily., Disp: 90 tablet, Rfl: 1   hydrochlorothiazide (HYDRODIURIL) 12.5 MG tablet, Take 1 tablet (12.5 mg total) by mouth daily., Disp: 90 tablet, Rfl: 1   hydrOXYzine (VISTARIL) 50 MG capsule, Take 1 capsule (50 mg total) by mouth at bedtime as needed for anxiety, Disp: 30 capsule, Rfl: 0   Multiple Vitamins-Minerals (BARIATRIC  MULTIVITAMINS/IRON PO), Take by mouth., Disp: , Rfl:    potassium chloride SA (KLOR-CON M) 20 MEQ tablet, Take 1 tablet (20 mEq total) by mouth 2 (two) times daily for 7 days, THEN decrease to 1 tablet (20 mEq total) daily., Disp: 45 tablet, Rfl: 0  Observations/Objective: Patient is well-developed, well-nourished in no acute distress.  Resting comfortably  at home.  Head is normocephalic, atraumatic.  No labored breathing.  Speech is clear and coherent with logical content.  Patient is alert and oriented at baseline.    Assessment and Plan: 1. Acute cystitis without hematuria  Increase fluids, preventative measures discussed, urgent care if sx persist or worsen.   Follow Up Instructions: I discussed the assessment and treatment plan with the patient. The patient was provided an opportunity to ask questions and all were answered. The patient agreed with the plan and demonstrated an understanding of the instructions.  A copy of instructions were sent to the patient via MyChart unless otherwise noted below.     The patient was advised to call back or seek an in-person evaluation if the symptoms worsen or if the condition fails to improve as anticipated.  Time:  I spent 10 minutes with the patient via telehealth technology discussing the above problems/concerns.    Dellia Nims, FNP

## 2022-02-12 NOTE — Patient Instructions (Signed)
Urinary Tract Infection, Adult  A urinary tract infection (UTI) is an infection of any part of the urinary tract. The urinary tract includes the kidneys, ureters, bladder, and urethra. These organs make, store, and get rid of urine in the body. An upper UTI affects the ureters and kidneys. A lower UTI affects the bladder and urethra. What are the causes? Most urinary tract infections are caused by bacteria in your genital area around your urethra, where urine leaves your body. These bacteria grow and cause inflammation of your urinary tract. What increases the risk? You are more likely to develop this condition if: You have a urinary catheter that stays in place. You are not able to control when you urinate or have a bowel movement (incontinence). You are female and you: Use a spermicide or diaphragm for birth control. Have low estrogen levels. Are pregnant. You have certain genes that increase your risk. You are sexually active. You take antibiotic medicines. You have a condition that causes your flow of urine to slow down, such as: An enlarged prostate, if you are female. Blockage in your urethra. A kidney stone. A nerve condition that affects your bladder control (neurogenic bladder). Not getting enough to drink, or not urinating often. You have certain medical conditions, such as: Diabetes. A weak disease-fighting system (immunesystem). Sickle cell disease. Gout. Spinal cord injury. What are the signs or symptoms? Symptoms of this condition include: Needing to urinate right away (urgency). Frequent urination. This may include small amounts of urine each time you urinate. Pain or burning with urination. Blood in the urine. Urine that smells bad or unusual. Trouble urinating. Cloudy urine. Vaginal discharge, if you are female. Pain in the abdomen or the lower back. You may also have: Vomiting or a decreased appetite. Confusion. Irritability or tiredness. A fever or  chills. Diarrhea. The first symptom in older adults may be confusion. In some cases, they may not have any symptoms until the infection has worsened. How is this diagnosed? This condition is diagnosed based on your medical history and a physical exam. You may also have other tests, including: Urine tests. Blood tests. Tests for STIs (sexually transmitted infections). If you have had more than one UTI, a cystoscopy or imaging studies may be done to determine the cause of the infections. How is this treated? Treatment for this condition includes: Antibiotic medicine. Over-the-counter medicines to treat discomfort. Drinking enough water to stay hydrated. If you have frequent infections or have other conditions such as a kidney stone, you may need to see a health care provider who specializes in the urinary tract (urologist). In rare cases, urinary tract infections can cause sepsis. Sepsis is a life-threatening condition that occurs when the body responds to an infection. Sepsis is treated in the hospital with IV antibiotics, fluids, and other medicines. Follow these instructions at home:  Medicines Take over-the-counter and prescription medicines only as told by your health care provider. If you were prescribed an antibiotic medicine, take it as told by your health care provider. Do not stop using the antibiotic even if you start to feel better. General instructions Make sure you: Empty your bladder often and completely. Do not hold urine for long periods of time. Empty your bladder after sex. Wipe from front to back after urinating or having a bowel movement if you are female. Use each tissue only one time when you wipe. Drink enough fluid to keep your urine pale yellow. Keep all follow-up visits. This is important. Contact a health   care provider if: Your symptoms do not get better after 1-2 days. Your symptoms go away and then return. Get help right away if: You have severe pain in  your back or your lower abdomen. You have a fever or chills. You have nausea or vomiting. Summary A urinary tract infection (UTI) is an infection of any part of the urinary tract, which includes the kidneys, ureters, bladder, and urethra. Most urinary tract infections are caused by bacteria in your genital area. Treatment for this condition often includes antibiotic medicines. If you were prescribed an antibiotic medicine, take it as told by your health care provider. Do not stop using the antibiotic even if you start to feel better. Keep all follow-up visits. This is important. This information is not intended to replace advice given to you by your health care provider. Make sure you discuss any questions you have with your health care provider. Document Revised: 11/23/2019 Document Reviewed: 11/23/2019 Elsevier Patient Education  2023 Elsevier Inc.  

## 2022-02-17 ENCOUNTER — Other Ambulatory Visit (HOSPITAL_COMMUNITY): Payer: Self-pay

## 2022-02-17 ENCOUNTER — Encounter: Payer: Self-pay | Admitting: Family

## 2022-02-17 ENCOUNTER — Telehealth (INDEPENDENT_AMBULATORY_CARE_PROVIDER_SITE_OTHER): Payer: 59 | Admitting: Family

## 2022-02-17 VITALS — BP 117/78 | Temp 98.6°F | Ht 64.0 in | Wt 215.0 lb

## 2022-02-17 DIAGNOSIS — I10 Essential (primary) hypertension: Secondary | ICD-10-CM | POA: Diagnosis not present

## 2022-02-17 DIAGNOSIS — F419 Anxiety disorder, unspecified: Secondary | ICD-10-CM

## 2022-02-17 DIAGNOSIS — F411 Generalized anxiety disorder: Secondary | ICD-10-CM

## 2022-02-17 MED ORDER — LOSARTAN POTASSIUM 25 MG PO TABS
25.0000 mg | ORAL_TABLET | Freq: Every day | ORAL | 0 refills | Status: DC
  Filled 2022-02-17: qty 90, 90d supply, fill #0

## 2022-02-17 MED ORDER — ALPRAZOLAM 1 MG PO TABS
0.5000 mg | ORAL_TABLET | Freq: Every day | ORAL | 5 refills | Status: DC
  Filled 2022-02-17: qty 30, 30d supply, fill #0
  Filled 2022-03-16 – 2022-03-19 (×2): qty 30, 30d supply, fill #1
  Filled 2022-04-15: qty 30, 30d supply, fill #2
  Filled 2022-05-16: qty 30, 30d supply, fill #3
  Filled 2022-06-14: qty 30, 30d supply, fill #4
  Filled 2022-07-13: qty 30, 30d supply, fill #5

## 2022-02-17 NOTE — Progress Notes (Signed)
MyChart Video Visit    Virtual Visit via Video Note   This visit type was conducted due to national recommendations for restrictions regarding the COVID-19 Pandemic (e.g. social distancing) in an effort to limit this patient's exposure and mitigate transmission in our community. This patient is at least at moderate risk for complications without adequate follow up. This format is felt to be most appropriate for this patient at this time. Physical exam was limited by quality of the video and audio technology used for the visit. CMA was able to get the patient set up on a video visit.  Patient location: Home. Patient and provider in visit Provider location: Office  I discussed the limitations of evaluation and management by telemedicine and the availability of in person appointments. The patient expressed understanding and agreed to proceed.  Visit Date: 02/17/2022  Today's healthcare provider: Eugenia Pancoast, FNP     Subjective:    Patient ID: Michelle Wilson, female    DOB: 1975-08-01, 46 y.o.   MRN: [id number]  Chief Complaint  Patient presents with  . discuss labs    Potassium    HPI  Pt here today via video visit with concerns in regards to blood pressure medication. HTN: has been taking hctz 12.5 mg however with chronically low potassim requiring supplementation. Would like to d/w me stopping this and discussing an alternative medication to control blood pressure.   Average now around 120/80 on regular basis with HCTZ.   Anxiety: has decreased from 3 mg to 1 mg klonipin daily and doing well on lexapro 20 mg once daily.   Past Medical History:  Diagnosis Date  . Anemia   . Anxiety   . GERD (gastroesophageal reflux disease)    pregnancy  . Hypertension    Aldomet 250 mg Q day  . IBS (irritable bowel syndrome)    no issues currently  . Infection    Yeast inf; no frequent    Past Surgical History:  Procedure Laterality Date  . BILATERAL SALPINGECTOMY   05/31/2012   Procedure: BILATERAL SALPINGECTOMY;  Surgeon: Eldred Manges, MD;  Location: [facility];  Service: Obstetrics;  Laterality: Bilateral;  . BREAST REDUCTION SURGERY  1997  . CESAREAN SECTION  2011  . CESAREAN SECTION  05/31/2012   Procedure: CESAREAN SECTION;  Surgeon: Eldred Manges, MD;  Location: [facility];  Service: Obstetrics;  Laterality: N/A;  . LAPAROSCOPIC GASTRIC SLEEVE RESECTION N/A 04/12/2016   Procedure: LAPAROSCOPIC GASTRIC SLEEVE RESECTION, UPPER ENDO;  Surgeon: Johnathan Hausen, MD;  Location: [facility];  Service: General;  Laterality: N/A;  . REDUCTION MAMMAPLASTY Bilateral     Family History  Problem Relation Age of Onset  . Hypertension Mother   . Heart disease Mother 58       Heart attack  . Hyperlipidemia Mother   . Hypertension Father   . Pancreatitis Father   . Hypertension Maternal Grandmother   . Hypertension Maternal Grandfather   . Stroke Maternal Grandfather   . Hypertension Paternal Grandmother   . Hypertension Paternal Grandfather   . Hyperthyroidism Maternal Aunt        x 3  . Schizophrenia Maternal Aunt   . Cancer Maternal Uncle        Lung deceased  . Cancer Maternal Uncle        Lung Cancer     Social History   Socioeconomic History  . Marital status: Married    Spouse name: Tiaunna Buford  . Number of children:  1  . Years of education: Not on file  . Highest education level: Not on file  Occupational History  . Occupation: Surveyor, minerals: [employer]    Comment: Writer  Tobacco Use  . Smoking status: Never    Passive exposure: Past  . Smokeless tobacco: Never  Vaping Use  . Vaping Use: Never used  Substance and Sexual Activity  . Alcohol use: No  . Drug use: No  . Sexual activity: Yes    Partners: Male    Birth control/protection: Surgical    Comment: Tubes Removed   Other Topics Concern  . Not on file  Social History Narrative  . Not on file   Social Determinants of Health    Financial Resource Strain: Not on file  Food Insecurity: Not on file  Transportation Needs: Not on file  Physical Activity: Not on file  Stress: Not on file  Social Connections: Not on file  Intimate Partner Violence: Not on file    Outpatient Medications Prior to Visit  Medication Sig Dispense Refill  . atorvastatin (LIPITOR) 10 MG tablet Take 1 tablet (10 mg total) by mouth daily. 90 tablet 3  . escitalopram (LEXAPRO) 20 MG tablet Take 1 tablet (20 mg total) by mouth daily. 90 tablet 1  . hydrOXYzine (VISTARIL) 50 MG capsule Take 1 capsule (50 mg total) by mouth at bedtime as needed for anxiety 30 capsule 0  . Multiple Vitamins-Minerals (BARIATRIC MULTIVITAMINS/IRON PO) Take by mouth.    . ALPRAZolam (XANAX) 1 MG tablet Take 0.5-1 tablets (0.5-1 mg total) by mouth daily. 30 tablet 0  . hydrochlorothiazide (HYDRODIURIL) 12.5 MG tablet Take 1 tablet (12.5 mg total) by mouth daily. 90 tablet 1  . potassium chloride SA (KLOR-CON M) 20 MEQ tablet Take 1 tablet (20 mEq total) by mouth 2 (two) times daily for 7 days, THEN decrease to 1 tablet (20 mEq total) daily. 45 tablet 0  . nitrofurantoin, macrocrystal-monohydrate, (MACROBID) 100 MG capsule Take 1 capsule (100 mg total) by mouth 2 (two) times daily for 5 days. (Patient not taking: Reported on 02/17/2022) 10 capsule 0   No facility-administered medications prior to visit.    No Known Allergies  Review of Systems     Objective:    Physical Exam Constitutional:      General: She is not in acute distress.    Appearance: Normal appearance. She is not ill-appearing or toxic-appearing.  Pulmonary:     Effort: Pulmonary effort is normal.  Neurological:     General: No focal deficit present.     Mental Status: She is alert and oriented to person, place, and time. Mental status is at baseline.  Psychiatric:        Mood and Affect: Mood normal.        Behavior: Behavior normal.        Thought Content: Thought content normal.         Judgment: Judgment normal.    BP 117/78   Temp 98.6 F (37 C)   Ht 5\' 4"  (1.626 m)   Wt 215 lb (97.5 kg)   LMP 01/30/2022   BMI 36.90 kg/m  Wt Readings from Last 3 Encounters:  02/17/22 215 lb (97.5 kg)  12/24/21 216 lb 2 oz (98 kg)  10/19/21 212 lb 4 oz (96.3 kg)       Assessment & Plan:   Problem List Items Addressed This Visit       Cardiovascular and Mediastinum  Essential hypertension, benign    Stop hctz  Start losartan 25 mg  Repeat potassium lab only appt in one month       Relevant Medications   losartan (COZAAR) 25 MG tablet     Other   GAD (generalized anxiety disorder)    pdmp reviewed Refilled klonopin 1 mg once daily prn  Continue lexapro 20 mg       Relevant Medications   ALPRAZolam (XANAX) 1 MG tablet   Anxiety state - Primary   Relevant Medications   ALPRAZolam (XANAX) 1 MG tablet   Other Visit Diagnoses     Anxiety       Relevant Medications   ALPRAZolam (XANAX) 1 MG tablet       I have discontinued Michelle Wilson's hydrochlorothiazide and nitrofurantoin (macrocrystal-monohydrate). I am also having her start on losartan. Additionally, I am having her maintain her Multiple Vitamins-Minerals (BARIATRIC MULTIVITAMINS/IRON PO), escitalopram, hydrOXYzine, potassium chloride SA, atorvastatin, and ALPRAZolam.  Meds ordered this encounter  Medications  . ALPRAZolam (XANAX) 1 MG tablet    Sig: Take 0.5-1 tablets (0.5-1 mg total) by mouth daily.    Dispense:  30 tablet    Refill:  5  . losartan (COZAAR) 25 MG tablet    Sig: Take 1 tablet (25 mg total) by mouth daily.    Dispense:  90 tablet    Refill:  0    Order Specific Question:   Supervising Provider    Answer:   BEDSOLE, AMY E [[id number]]    I discussed the assessment and treatment plan with the patient. The patient was provided an opportunity to ask questions and all were answered. The patient agreed with the plan and demonstrated an understanding of the instructions.   The  patient was advised to call back or seek an in-person evaluation if the symptoms worsen or if the condition fails to improve as anticipated.  I provided 15 minutes of face-to-face time during this encounter.   Mort Sawyers, FNP  [facility] [phone] (phone) [phone] (fax)  [facility]

## 2022-02-17 NOTE — Assessment & Plan Note (Signed)
pdmp reviewed Refilled klonopin 1 mg once daily prn  Continue lexapro 20 mg

## 2022-02-17 NOTE — Assessment & Plan Note (Signed)
Stop hctz  Start losartan 25 mg  Repeat potassium lab only appt in one month

## 2022-03-14 ENCOUNTER — Other Ambulatory Visit: Payer: Self-pay | Admitting: Family

## 2022-03-14 DIAGNOSIS — F411 Generalized anxiety disorder: Secondary | ICD-10-CM

## 2022-03-15 ENCOUNTER — Other Ambulatory Visit (HOSPITAL_COMMUNITY): Payer: Self-pay

## 2022-03-15 MED ORDER — ESCITALOPRAM OXALATE 20 MG PO TABS
20.0000 mg | ORAL_TABLET | Freq: Every day | ORAL | 1 refills | Status: DC
  Filled 2022-03-15: qty 90, 90d supply, fill #0
  Filled 2022-06-14: qty 90, 90d supply, fill #1

## 2022-03-16 ENCOUNTER — Other Ambulatory Visit (HOSPITAL_COMMUNITY): Payer: Self-pay

## 2022-03-17 ENCOUNTER — Other Ambulatory Visit (HOSPITAL_COMMUNITY): Payer: Self-pay

## 2022-03-19 ENCOUNTER — Other Ambulatory Visit (HOSPITAL_COMMUNITY): Payer: Self-pay

## 2022-04-02 ENCOUNTER — Telehealth: Payer: 59 | Admitting: Physician Assistant

## 2022-04-02 ENCOUNTER — Other Ambulatory Visit (HOSPITAL_COMMUNITY): Payer: Self-pay

## 2022-04-02 DIAGNOSIS — B9689 Other specified bacterial agents as the cause of diseases classified elsewhere: Secondary | ICD-10-CM | POA: Diagnosis not present

## 2022-04-02 DIAGNOSIS — J019 Acute sinusitis, unspecified: Secondary | ICD-10-CM | POA: Diagnosis not present

## 2022-04-02 MED ORDER — AMOXICILLIN-POT CLAVULANATE 875-125 MG PO TABS
1.0000 | ORAL_TABLET | Freq: Two times a day (BID) | ORAL | 0 refills | Status: DC
  Filled 2022-04-02: qty 14, 7d supply, fill #0

## 2022-04-02 NOTE — Progress Notes (Signed)
Virtual Visit Consent   Michelle Wilson, you are scheduled for a virtual visit with a [facility] provider today. Just as with appointments in the office, your consent must be obtained to participate. Your consent will be active for this visit and any virtual visit you may have with one of our providers in the next 365 days. If you have a MyChart account, a copy of this consent can be sent to you electronically.  As this is a virtual visit, video technology does not allow for your provider to perform a traditional examination. This may limit your provider's ability to fully assess your condition. If your provider identifies any concerns that need to be evaluated in person or the need to arrange testing (such as labs, EKG, etc.), we will make arrangements to do so. Although advances in technology are sophisticated, we cannot ensure that it will always work on either your end or our end. If the connection with a video visit is poor, the visit may have to be switched to a telephone visit. With either a video or telephone visit, we are not always able to ensure that we have a secure connection.  By engaging in this virtual visit, you consent to the provision of healthcare and authorize for your insurance to be billed (if applicable) for the services provided during this visit. Depending on your insurance coverage, you may receive a charge related to this service.  I need to obtain your verbal consent now. Are you willing to proceed with your visit today? Michelle Wilson has provided verbal consent on 04/02/2022 for a virtual visit (video or telephone). Michelle Loveless, PA-C  Date: 04/02/2022 9:15 AM  Virtual Visit via Video Note   I, Michelle Wilson, connected with  Michelle Wilson  ([id number], [birth year]) on 04/02/22 at  9:15 AM EST by a video-enabled telemedicine application and verified that I am speaking with the correct person using two identifiers.  Location: Patient: Virtual Visit  Location Patient: Mobile Provider: Virtual Visit Location Provider: Home Office   I discussed the limitations of evaluation and management by telemedicine and the availability of in person appointments. The patient expressed understanding and agreed to proceed.    History of Present Illness: Michelle Wilson is a 47 y.o. who identifies as a female who was assigned female at birth, and is being seen today for possible sinus infection.  HPI: Sinusitis This is a new problem. The current episode started 1 to 4 weeks ago (2 weeks). The problem has been gradually worsening since onset. There has been no fever. Associated symptoms include congestion, coughing, headaches, sinus pressure, a sore throat and swollen glands. Pertinent negatives include no chills, ear pain or hoarse voice. Treatments tried: mucinex, nasonex nasal spray, tylenol. The treatment provided mild relief.     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   Essential hypertension, benign 08/19/2011   BMI 35.0-35.9,adult 08/19/2011    Allergies: No Known Allergies Medications:  Current Outpatient Medications:    amoxicillin-clavulanate (AUGMENTIN) 875-125 MG tablet, Take 1 tablet by mouth 2 (two) times daily., Disp: 14 tablet, Rfl: 0   ALPRAZolam (XANAX) 1 MG tablet, Take 0.5-1 tablets (0.5-1 mg total) by  mouth daily., Disp: 30 tablet, Rfl: 5   atorvastatin (LIPITOR) 10 MG tablet, Take 1 tablet (10 mg total) by mouth daily., Disp: 90 tablet, Rfl: 3   escitalopram (LEXAPRO) 20 MG tablet, Take 1 tablet (20 mg total) by mouth daily., Disp: 90 tablet, Rfl: 1   hydrOXYzine (VISTARIL) 50 MG capsule, Take 1 capsule (50 mg  total) by mouth at bedtime as needed for anxiety, Disp: 30 capsule, Rfl: 0   losartan (COZAAR) 25 MG tablet, Take 1 tablet (25 mg total) by mouth daily., Disp: 90 tablet, Rfl: 0   Multiple Vitamins-Minerals (BARIATRIC MULTIVITAMINS/IRON PO), Take by mouth., Disp: , Rfl:    potassium chloride SA (KLOR-CON M) 20 MEQ tablet, Take 1 tablet (20 mEq total) by mouth 2 (two) times daily for 7 days, THEN decrease to 1 tablet (20 mEq total) daily., Disp: 45 tablet, Rfl: 0  Observations/Objective: Patient is well-developed, well-nourished in no acute distress.  Resting comfortably  Head is normocephalic, atraumatic.  No labored breathing.  Speech is clear and coherent with logical content.  Patient is alert and oriented at baseline.    Assessment and Plan: 1. Acute bacterial sinusitis - amoxicillin-clavulanate (AUGMENTIN) 875-125 MG tablet; Take 1 tablet by mouth 2 (two) times daily.  Dispense: 14 tablet; Refill: 0  - Worsening symptoms that have not responded to OTC medications.  - Will give Augmentin - Continue allergy medications.  - Steam and humidifier can help - Stay well hydrated and get plenty of rest.  - Seek in person evaluation if no symptom improvement or if symptoms worsen  Follow Up Instructions: I discussed the assessment and treatment plan with the patient. The patient was provided an opportunity to ask questions and all were answered. The patient agreed with the plan and demonstrated an understanding of the instructions.  A copy of instructions were sent to the patient via MyChart unless otherwise noted below.    The patient was advised to call back or seek an in-person evaluation if the symptoms worsen or if the condition fails to improve as anticipated.   Time:  I spent 8 minutes with the patient via telehealth technology discussing the above problems/concerns.    Michelle Loveless, PA-C

## 2022-04-02 NOTE — Patient Instructions (Signed)
[name], thank you for joining Margaretann Loveless, PA-C for today's virtual visit.  While this provider is not your primary care provider (PCP), if your PCP is located in our provider database this encounter information will be shared with them immediately following your visit.   A [facility] MyChart account gives you access to today's visit and all your visits, tests, and labs performed at [facility] " click here if you don't have a [facility] MyChart account or go to [email or web address]  Consent: (Patient) Michelle Wilson provided verbal consent for this virtual visit at the beginning of the encounter.  Current Medications:  Current Outpatient Medications:    amoxicillin-clavulanate (AUGMENTIN) 875-125 MG tablet, Take 1 tablet by mouth 2 (two) times daily., Disp: 14 tablet, Rfl: 0   ALPRAZolam (XANAX) 1 MG tablet, Take 0.5-1 tablets (0.5-1 mg total) by mouth daily., Disp: 30 tablet, Rfl: 5   atorvastatin (LIPITOR) 10 MG tablet, Take 1 tablet (10 mg total) by mouth daily., Disp: 90 tablet, Rfl: 3   escitalopram (LEXAPRO) 20 MG tablet, Take 1 tablet (20 mg total) by mouth daily., Disp: 90 tablet, Rfl: 1   hydrOXYzine (VISTARIL) 50 MG capsule, Take 1 capsule (50 mg total) by mouth at bedtime as needed for anxiety, Disp: 30 capsule, Rfl: 0   losartan (COZAAR) 25 MG tablet, Take 1 tablet (25 mg total) by mouth daily., Disp: 90 tablet, Rfl: 0   Multiple Vitamins-Minerals (BARIATRIC MULTIVITAMINS/IRON PO), Take by mouth., Disp: , Rfl:    potassium chloride SA (KLOR-CON M) 20 MEQ tablet, Take 1 tablet (20 mEq total) by mouth 2 (two) times daily for 7 days, THEN decrease to 1 tablet (20 mEq total) daily., Disp: 45 tablet, Rfl: 0   Medications ordered in this encounter:  Meds ordered this encounter  Medications   amoxicillin-clavulanate (AUGMENTIN) 875-125 MG tablet    Sig: Take 1 tablet by mouth 2 (two) times daily.    Dispense:  14 tablet    Refill:  0     Order Specific Question:   Supervising Provider    Answer:   Merrilee Jansky [id number]     *If you need refills on other medications prior to your next appointment, please contact your pharmacy*  Follow-Up: Call back or seek an in-person evaluation if the symptoms worsen or if the condition fails to improve as anticipated.  [facility] [phone]  Other Instructions  Sinus Infection, Adult A sinus infection, also called sinusitis, is inflammation of your sinuses. Sinuses are hollow spaces in the bones around your face. Your sinuses are located: Around your eyes. In the middle of your forehead. Behind your nose. In your cheekbones. Mucus normally drains out of your sinuses. When your nasal tissues become inflamed or swollen, mucus can become trapped or blocked. This allows bacteria, viruses, and fungi to grow, which leads to infection. Most infections of the sinuses are caused by a virus. A sinus infection can develop quickly. It can last for up to 4 weeks (acute) or for more than 12 weeks (chronic). A sinus infection often develops after a cold. What are the causes? This condition is caused by anything that creates swelling in the sinuses or stops mucus from draining. This includes: Allergies. Asthma. Infection from bacteria or viruses. Deformities or blockages in your nose or sinuses. Abnormal growths in the nose (nasal polyps). Pollutants, such as chemicals or irritants in the air. Infection from fungi. This is rare. What increases the risk? You  are more likely to develop this condition if you: Have a weak body defense system (immune system). Do a lot of swimming or diving. Overuse nasal sprays. Smoke. What are the signs or symptoms? The main symptoms of this condition are pain and a feeling of pressure around the affected sinuses. Other symptoms include: Stuffy nose or congestion that makes it difficult to breathe through your nose. Thick yellow or  greenish drainage from your nose. Tenderness, swelling, and warmth over the affected sinuses. A cough that may get worse at night. Decreased sense of smell and taste. Extra mucus that collects in the throat or the back of the nose (postnasal drip) causing a sore throat or bad breath. Tiredness (fatigue). Fever. How is this diagnosed? This condition is diagnosed based on: Your symptoms. Your medical history. A physical exam. Tests to find out if your condition is acute or chronic. This may include: Checking your nose for nasal polyps. Viewing your sinuses using a device that has a light (endoscope). Testing for allergies or bacteria. Imaging tests, such as an MRI or CT scan. In rare cases, a bone biopsy may be done to rule out more serious types of fungal sinus disease. How is this treated? Treatment for a sinus infection depends on the cause and whether your condition is chronic or acute. If caused by a virus, your symptoms should go away on their own within 10 days. You may be given medicines to relieve symptoms. They include: Medicines that shrink swollen nasal passages (decongestants). A spray that eases inflammation of the nostrils (topical intranasal corticosteroids). Rinses that help get rid of thick mucus in your nose (nasal saline washes). Medicines that treat allergies (antihistamines). Over-the-counter pain relievers. If caused by bacteria, your health care provider may recommend waiting to see if your symptoms improve. Most bacterial infections will get better without antibiotic medicine. You may be given antibiotics if you have: A severe infection. A weak immune system. If caused by narrow nasal passages or nasal polyps, surgery may be needed. Follow these instructions at home: Medicines Take, use, or apply over-the-counter and prescription medicines only as told by your health care provider. These may include nasal sprays. If you were prescribed an antibiotic medicine,  take it as told by your health care provider. Do not stop taking the antibiotic even if you start to feel better. Hydrate and humidify  Drink enough fluid to keep your urine pale yellow. Staying hydrated will help to thin your mucus. Use a cool mist humidifier to keep the humidity level in your home above 50%. Inhale steam for 10-15 minutes, 3-4 times a day, or as told by your health care provider. You can do this in the bathroom while a hot shower is running. Limit your exposure to cool or dry air. Rest Rest as much as possible. Sleep with your head raised (elevated). Make sure you get enough sleep each night. General instructions  Apply a warm, moist washcloth to your face 3-4 times a day or as told by your health care provider. This will help with discomfort. Use nasal saline washes as often as told by your health care provider. Wash your hands often with soap and water to reduce your exposure to germs. If soap and water are not available, use hand sanitizer. Do not smoke. Avoid being around people who are smoking (secondhand smoke). Keep all follow-up visits. This is important. Contact a health care provider if: You have a fever. Your symptoms get worse. Your symptoms do not improve  within 10 days. Get help right away if: You have a severe headache. You have persistent vomiting. You have severe pain or swelling around your face or eyes. You have vision problems. You develop confusion. Your neck is stiff. You have trouble breathing. These symptoms may be an emergency. Get help right away. Call 911. Do not wait to see if the symptoms will go away. Do not drive yourself to the hospital. Summary A sinus infection is soreness and inflammation of your sinuses. Sinuses are hollow spaces in the bones around your face. This condition is caused by nasal tissues that become inflamed or swollen. The swelling traps or blocks the flow of mucus. This allows bacteria, viruses, and fungi to  grow, which leads to infection. If you were prescribed an antibiotic medicine, take it as told by your health care provider. Do not stop taking the antibiotic even if you start to feel better. Keep all follow-up visits. This is important. This information is not intended to replace advice given to you by your health care provider. Make sure you discuss any questions you have with your health care provider. Document Revised: 03/17/2021 Document Reviewed: 03/17/2021 Elsevier Patient Education  2023 Elsevier Inc.    If you have been instructed to have an in-person evaluation today at a local Urgent Care facility, please use the link below. It will take you to a list of all of our available [facility], including address, phone number and hours of operation. Please do not delay care.  [facility]  If you or a family member do not have a primary care provider, use the link below to schedule a visit and establish care. When you choose a [facility] primary care physician or advanced practice provider, you gain a long-term partner in health. Find a Primary Care Provider  Learn more about [facility] in-office and virtual care options:  - Get Care Now

## 2022-04-16 ENCOUNTER — Other Ambulatory Visit: Payer: Self-pay

## 2022-04-16 ENCOUNTER — Other Ambulatory Visit (HOSPITAL_COMMUNITY): Payer: Self-pay

## 2022-04-20 ENCOUNTER — Other Ambulatory Visit (HOSPITAL_COMMUNITY): Payer: Self-pay

## 2022-04-28 ENCOUNTER — Other Ambulatory Visit: Payer: Self-pay | Admitting: Family

## 2022-04-28 DIAGNOSIS — F411 Generalized anxiety disorder: Secondary | ICD-10-CM

## 2022-04-29 ENCOUNTER — Other Ambulatory Visit (HOSPITAL_COMMUNITY): Payer: Self-pay

## 2022-04-29 MED ORDER — HYDROXYZINE PAMOATE 50 MG PO CAPS
50.0000 mg | ORAL_CAPSULE | Freq: Every evening | ORAL | 0 refills | Status: DC | PRN
  Filled 2022-04-29: qty 30, 30d supply, fill #0

## 2022-05-11 ENCOUNTER — Other Ambulatory Visit: Payer: Self-pay | Admitting: Family

## 2022-05-11 ENCOUNTER — Other Ambulatory Visit (HOSPITAL_COMMUNITY): Payer: Self-pay

## 2022-05-11 DIAGNOSIS — I10 Essential (primary) hypertension: Secondary | ICD-10-CM

## 2022-05-11 MED ORDER — LOSARTAN POTASSIUM 25 MG PO TABS
25.0000 mg | ORAL_TABLET | Freq: Every day | ORAL | 0 refills | Status: DC
  Filled 2022-05-11: qty 90, 90d supply, fill #0

## 2022-06-14 ENCOUNTER — Other Ambulatory Visit: Payer: Self-pay

## 2022-06-18 ENCOUNTER — Ambulatory Visit: Payer: 59 | Admitting: Family

## 2022-06-22 ENCOUNTER — Ambulatory Visit: Payer: Commercial Managed Care - PPO | Admitting: Family

## 2022-06-22 ENCOUNTER — Encounter: Payer: Self-pay | Admitting: Family

## 2022-06-22 ENCOUNTER — Other Ambulatory Visit (HOSPITAL_COMMUNITY): Payer: Self-pay

## 2022-06-22 VITALS — BP 132/84 | HR 81 | Temp 97.9°F | Ht 64.0 in | Wt 224.4 lb

## 2022-06-22 DIAGNOSIS — Z79899 Other long term (current) drug therapy: Secondary | ICD-10-CM

## 2022-06-22 DIAGNOSIS — E876 Hypokalemia: Secondary | ICD-10-CM

## 2022-06-22 DIAGNOSIS — Z6838 Body mass index (BMI) 38.0-38.9, adult: Secondary | ICD-10-CM

## 2022-06-22 DIAGNOSIS — R635 Abnormal weight gain: Secondary | ICD-10-CM

## 2022-06-22 DIAGNOSIS — D509 Iron deficiency anemia, unspecified: Secondary | ICD-10-CM | POA: Diagnosis not present

## 2022-06-22 DIAGNOSIS — I10 Essential (primary) hypertension: Secondary | ICD-10-CM | POA: Diagnosis not present

## 2022-06-22 DIAGNOSIS — F411 Generalized anxiety disorder: Secondary | ICD-10-CM

## 2022-06-22 DIAGNOSIS — N951 Menopausal and female climacteric states: Secondary | ICD-10-CM

## 2022-06-22 DIAGNOSIS — E7800 Pure hypercholesterolemia, unspecified: Secondary | ICD-10-CM

## 2022-06-22 LAB — LIPID PANEL
Cholesterol: 222 mg/dL — ABNORMAL HIGH (ref 0–200)
HDL: 66.6 mg/dL (ref >39.00)
LDL Cholesterol: 142 mg/dL — ABNORMAL HIGH (ref 0–99)
NonHDL: 155.16
Total CHOL/HDL Ratio: 3
Triglycerides: 67 mg/dL (ref 0.0–149.0)
VLDL: 13.4 mg/dL (ref 0.0–40.0)

## 2022-06-22 LAB — CBC WITH DIFFERENTIAL/PLATELET
Basophils Absolute: 0 10*3/uL (ref 0.0–0.1)
Basophils Relative: 0.6 % (ref 0.0–3.0)
Eosinophils Absolute: 0.1 10*3/uL (ref 0.0–0.7)
Eosinophils Relative: 2.5 % (ref 0.0–5.0)
HCT: 35.5 % — ABNORMAL LOW (ref 36.0–46.0)
Hemoglobin: 11.9 g/dL — ABNORMAL LOW (ref 12.0–15.0)
Lymphocytes Relative: 30.1 % (ref 12.0–46.0)
Lymphs Abs: 1.4 10*3/uL (ref 0.7–4.0)
MCHC: 33.4 g/dL (ref 30.0–36.0)
MCV: 83.4 fl (ref 78.0–100.0)
Monocytes Absolute: 0.3 10*3/uL (ref 0.1–1.0)
Monocytes Relative: 7.3 % (ref 3.0–12.0)
Neutro Abs: 2.8 10*3/uL (ref 1.4–7.7)
Neutrophils Relative %: 59.5 % (ref 43.0–77.0)
Platelets: 276 10*3/uL (ref 150.0–400.0)
RBC: 4.26 Mil/uL (ref 3.87–5.11)
RDW: 14.4 % (ref 11.5–15.5)
WBC: 4.6 10*3/uL (ref 4.0–10.5)

## 2022-06-22 LAB — FOLLICLE STIMULATING HORMONE: FSH: 55.7 m[IU]/mL

## 2022-06-22 LAB — TSH: TSH: 0.81 u[IU]/mL (ref 0.35–5.50)

## 2022-06-22 LAB — COMPREHENSIVE METABOLIC PANEL
ALT: 14 U/L (ref 0–35)
AST: 17 U/L (ref 0–37)
Albumin: 3.6 g/dL (ref 3.5–5.2)
Alkaline Phosphatase: 101 U/L (ref 39–117)
BUN: 10 mg/dL (ref 6–23)
CO2: 33 mEq/L — ABNORMAL HIGH (ref 19–32)
Calcium: 9.3 mg/dL (ref 8.4–10.5)
Chloride: 103 mEq/L (ref 96–112)
Creatinine, Ser: 0.77 mg/dL (ref 0.40–1.20)
GFR: 92.71 mL/min (ref >60.00)
Glucose, Bld: 79 mg/dL (ref 70–99)
Potassium: 4 mEq/L (ref 3.5–5.1)
Sodium: 140 mEq/L (ref 135–145)
Total Bilirubin: 0.4 mg/dL (ref 0.2–1.2)
Total Protein: 7 g/dL (ref 6.0–8.3)

## 2022-06-22 LAB — MICROALBUMIN / CREATININE URINE RATIO
Creatinine,U: 141.9 mg/dL
Microalb Creat Ratio: 0.5 mg/g (ref 0.0–30.0)
Microalb, Ur: <0.7 mg/dL (ref 0.0–1.9)

## 2022-06-22 MED ORDER — LOSARTAN POTASSIUM 25 MG PO TABS
25.0000 mg | ORAL_TABLET | Freq: Every day | ORAL | 3 refills | Status: DC
  Filled 2022-06-22 – 2022-08-09 (×2): qty 90, 90d supply, fill #0
  Filled 2022-11-01: qty 90, 90d supply, fill #1
  Filled 2023-02-06: qty 90, 90d supply, fill #2
  Filled 2023-05-04: qty 90, 90d supply, fill #3

## 2022-06-22 MED ORDER — HYDROXYZINE PAMOATE 50 MG PO CAPS
50.0000 mg | ORAL_CAPSULE | Freq: Every evening | ORAL | 2 refills | Status: DC | PRN
  Filled 2022-06-22: qty 30, 30d supply, fill #0
  Filled 2022-08-09: qty 30, 30d supply, fill #1
  Filled 2022-09-17: qty 30, 30d supply, fill #2

## 2022-06-22 MED ORDER — ESCITALOPRAM OXALATE 20 MG PO TABS
20.0000 mg | ORAL_TABLET | Freq: Every day | ORAL | 3 refills | Status: DC
  Filled 2022-06-22 – 2022-09-09 (×2): qty 90, 90d supply, fill #0
  Filled 2022-12-06: qty 90, 90d supply, fill #1
  Filled 2023-03-07: qty 90, 90d supply, fill #2

## 2022-06-22 NOTE — Assessment & Plan Note (Addendum)
Pt advised to work on diet and exercise as tolerated For abn weight gain will check tsh  Could be exercise/diet related and or perimenopause

## 2022-06-22 NOTE — Progress Notes (Signed)
Was pt fasting?  If yes cholesterol has increased significantly  Anemia is stable. Tyroid stable not concerning.  You are hitting the post menopausal/perimenopausal stage with your [facility] levels. Pending one more lab

## 2022-06-22 NOTE — Assessment & Plan Note (Signed)
Continue lexapro 20 mg once daily  Did d/w her the goal of decreasing xanax due to long term s/e and start with hydroxyxine Pdmp reviewed Refil sent for xanax and hydroxyxine

## 2022-06-22 NOTE — Patient Instructions (Addendum)
  Try to replace one daily xanax with hydroxyxine.  And then f/u in about six weeks can be video visit so we can touch base on how this is going.  Eventual goal is to only take xanax a few times a week.  We could also consider trazodone for a sleep aid instead of xanax.   Call to get colonoscopy scheduled.   Can try some black cohash over the counter for menopausal and or two cups soy milk daily.

## 2022-06-22 NOTE — Assessment & Plan Note (Signed)
Continue losartan 25 mg once daily.  Pt advised of the following:  Continue medication as prescribed. Monitor blood pressure periodically and/or when you feel symptomatic. Goal is <130/90 on average. Ensure that you have rested for 30 minutes prior to checking your blood pressure. Record your readings and bring them to your next visit if necessary.work on a low sodium diet.

## 2022-06-22 NOTE — Progress Notes (Signed)
Established Patient Office Visit  Subjective:      CC:  Chief Complaint  Patient presents with   Medical Management of Chronic Issues    HPI: Michelle Wilson is a 47 y.o. female presenting on 06/22/2022 for Medical Management of Chronic Issues . HTN: today 132/84, started last visit on losartan 25 mg once daily. Typically at home <120/80, we also stopped the HCTZ.   GAD: doing really well on lexapro states feeling much better since taking this.  Also taking xanax 0.5 mg bid. Has tried hydroxyxine in the past but only helps slightly. Has also tried [place] in the past which was too fatiguing.      06/22/2022    7:39 AM 10/19/2021    4:27 PM 04/23/2021    9:14 AM 09/24/2020    8:23 AM  GAD 7 : Generalized Anxiety Score  Nervous, Anxious, on Edge 1 0 2 3  Control/stop worrying 0 0 3 2  Worry too much - different things 0 0 2 2  Trouble relaxing 1 0 1 0  Restless 1 0 1 2  Easily annoyed or irritable 1 0 1 3  Afraid - awful might happen 1 0 0 1  Total GAD 7 Score 5 0 10 13  Anxiety Difficulty Somewhat difficult Not difficult at all Somewhat difficult Somewhat difficult       06/22/2022    7:39 AM 10/19/2021    4:27 PM 07/03/2021    7:44 AM  PHQ9 SCORE ONLY  PHQ-9 Total Score '7 3 4   '$ Wt Readings from Last 3 Encounters:  06/22/22 224 lb 6.4 oz (101.8 kg)  02/17/22 215 lb (97.5 kg)  12/24/21 216 lb 2 oz (98 kg)   Abn weight gain, however she does state poor diet over the holidays and has been lacking exercise. She states has started a new exercise routine this week as well.    New complaints: Hot flashes out of wack, and very uncomfortable. She feels as though she is burning from the inside out.   Irregularity of menses: over the last two years starting to change, every few months or so, heavier than normal when period comes on.   Social history:  Relevant past medical, surgical, family and social history reviewed and updated as indicated. Interim medical history  since our last visit reviewed.  Allergies and medications reviewed and updated.  DATA REVIEWED: CHART IN EPIC     ROS: Negative unless specifically indicated above in HPI.    Current Outpatient Medications:    ALPRAZolam (XANAX) 1 MG tablet, Take 0.5-1 tablets (0.5-1 mg total) by mouth daily., Disp: 30 tablet, Rfl: 5   atorvastatin (LIPITOR) 10 MG tablet, Take 1 tablet (10 mg total) by mouth daily., Disp: 90 tablet, Rfl: 3   Multiple Vitamins-Minerals (BARIATRIC MULTIVITAMINS/IRON PO), Take by mouth., Disp: , Rfl:    escitalopram (LEXAPRO) 20 MG tablet, Take 1 tablet (20 mg total) by mouth daily., Disp: 90 tablet, Rfl: 3   hydrOXYzine (VISTARIL) 50 MG capsule, Take 1 capsule (50 mg total) by mouth at bedtime as needed for anxiety, Disp: 30 capsule, Rfl: 2   losartan (COZAAR) 25 MG tablet, Take 1 tablet (25 mg total) by mouth daily., Disp: 90 tablet, Rfl: 3      Objective:    BP 132/84   Pulse 81   Temp 97.9 F (36.6 C) (Temporal)   Ht '5\' 4"'$  (1.626 m)   Wt 224 lb 6.4 oz (101.8 kg)   SpO2  99%   BMI 38.52 kg/m   Wt Readings from Last 3 Encounters:  06/22/22 224 lb 6.4 oz (101.8 kg)  02/17/22 215 lb (97.5 kg)  12/24/21 216 lb 2 oz (98 kg)    Physical Exam Constitutional:      General: She is not in acute distress.    Appearance: Normal appearance. She is obese. She is not ill-appearing, toxic-appearing or diaphoretic.  HENT:     Head: Normocephalic.  Cardiovascular:     Rate and Rhythm: Normal rate and regular rhythm.  Pulmonary:     Effort: Pulmonary effort is normal.  Musculoskeletal:        General: Normal range of motion.  Neurological:     General: No focal deficit present.     Mental Status: She is alert and oriented to person, place, and time. Mental status is at baseline.  Psychiatric:        Mood and Affect: Mood normal.        Behavior: Behavior normal.        Thought Content: Thought content normal.        Judgment: Judgment normal.            Assessment & Plan:  Elevated LDL cholesterol level Assessment & Plan: Ordered lipid panel, pending results. Work on low cholesterol diet and exercise as tolerated Continue lipitor   Orders: -     Lipid panel  GAD (generalized anxiety disorder) Assessment & Plan: Continue lexapro 20 mg once daily  Did d/w her the goal of decreasing xanax due to long term s/e and start with hydroxyxine Pdmp reviewed Refil sent for xanax and hydroxyxine  Orders: -     hydrOXYzine Pamoate; Take 1 capsule (50 mg total) by mouth at bedtime as needed for anxiety  Dispense: 30 capsule; Refill: 2 -     Escitalopram Oxalate; Take 1 tablet (20 mg total) by mouth daily.  Dispense: 90 tablet; Refill: 3  Hypokalemia -     Comprehensive metabolic panel  On statin therapy -     Comprehensive metabolic panel  Iron deficiency anemia, unspecified iron deficiency anemia type -     CBC with Differential/Platelet  Abnormal weight gain -     TSH  Menopausal hot flushes -     TSH -     Follicle stimulating hormone -     Prolactin  Essential hypertension, benign Assessment & Plan: Continue losartan 25 mg once daily.  Pt advised of the following:  Continue medication as prescribed. Monitor blood pressure periodically and/or when you feel symptomatic. Goal is <130/90 on average. Ensure that you have rested for 30 minutes prior to checking your blood pressure. Record your readings and bring them to your next visit if necessary.work on a low sodium diet.   Orders: -     Losartan Potassium; Take 1 tablet (25 mg total) by mouth daily.  Dispense: 90 tablet; Refill: 3 -     Microalbumin / creatinine urine ratio  Class 2 severe obesity due to excess calories with serious comorbidity and body mass index (BMI) of 38.0 to 38.9 in adult [facility]) Assessment & Plan: Pt advised to work on diet and exercise as tolerated For abn weight gain will check tsh  Could be exercise/diet related and or perimenopause         Return in about 4 weeks (around 07/20/2022) for f/u anxiety medication .  Eugenia Pancoast, MSN, APRN, FNP-C [facility]

## 2022-06-22 NOTE — Assessment & Plan Note (Signed)
Ordered lipid panel, pending results. Work on low cholesterol diet and exercise as tolerated Continue lipitor

## 2022-06-23 LAB — PROLACTIN: Prolactin: 8.6 ng/mL

## 2022-07-13 ENCOUNTER — Other Ambulatory Visit: Payer: Self-pay

## 2022-07-20 ENCOUNTER — Encounter: Payer: Self-pay | Admitting: Family

## 2022-07-20 ENCOUNTER — Telehealth (INDEPENDENT_AMBULATORY_CARE_PROVIDER_SITE_OTHER): Payer: Commercial Managed Care - PPO | Admitting: Family

## 2022-07-20 ENCOUNTER — Other Ambulatory Visit (HOSPITAL_COMMUNITY): Payer: Self-pay

## 2022-07-20 VITALS — BP 117/78 | Ht 64.0 in | Wt 219.0 lb

## 2022-07-20 DIAGNOSIS — F411 Generalized anxiety disorder: Secondary | ICD-10-CM

## 2022-07-20 DIAGNOSIS — G479 Sleep disorder, unspecified: Secondary | ICD-10-CM | POA: Diagnosis not present

## 2022-07-20 DIAGNOSIS — E785 Hyperlipidemia, unspecified: Secondary | ICD-10-CM

## 2022-07-20 MED ORDER — TRAZODONE HCL 50 MG PO TABS
50.0000 mg | ORAL_TABLET | Freq: Every evening | ORAL | 0 refills | Status: DC | PRN
  Filled 2022-07-20: qty 90, 90d supply, fill #0

## 2022-07-20 NOTE — Assessment & Plan Note (Addendum)
Goal d/w pt is to decrease xanax to 1/2 tablet in am, hydroxyxine at night in place of 1/2 tablet xanax pm.  Rx trazodone 50 mg prn insomnia if needed.  Continue lexapro 20 mg once daily

## 2022-07-20 NOTE — Assessment & Plan Note (Signed)
Pt tolerating statin Will repeat next visit

## 2022-07-20 NOTE — Progress Notes (Signed)
Virtual Visit via Video note  I connected with BAELEY ORLIKOWSKI on 07/20/22 at work by video and verified that I am speaking with the correct person using two identifiers. KEJA GRESSLEY is currently located at work.The provider, Eugenia Pancoast, FNP is located in their office at time of visit.  I discussed the limitations, risks, security and privacy concerns of performing an evaluation and management service by video and the availability of in person appointments. I also discussed with the patient that there may be a patient responsible charge related to this service. The patient expressed understanding and agreed to proceed.  Subjective: PCP: Eugenia Pancoast, FNP  Chief Complaint  Patient presents with   Medical Management of Chronic Issues    HPI  Anxiety, last visit d/w pt goal of decreasing xanax to once daily with hydroxyxine. She is also taking lexapro 20 mg one daily. We also discussed we could start trazodone if needed for a sleep aid rather than xanax. However, pt does state still taking 1/2 in am and 1/2 at night time. She did try to swap it out with hydroxyxine   She did also try taking black cohash and this is helping her some with the night sweats and mood swings with menopause. It's been two months without a period, last one was in January.         ROS: Per HPI  Current Outpatient Medications:    ALPRAZolam (XANAX) 1 MG tablet, Take 0.5-1 tablets (0.5-1 mg total) by mouth daily., Disp: 30 tablet, Rfl: 5   atorvastatin (LIPITOR) 10 MG tablet, Take 1 tablet (10 mg total) by mouth daily., Disp: 90 tablet, Rfl: 3   escitalopram (LEXAPRO) 20 MG tablet, Take 1 tablet (20 mg total) by mouth daily., Disp: 90 tablet, Rfl: 3   hydrOXYzine (VISTARIL) 50 MG capsule, Take 1 capsule (50 mg total) by mouth at bedtime as needed for anxiety, Disp: 30 capsule, Rfl: 2   losartan (COZAAR) 25 MG tablet, Take 1 tablet (25 mg total) by mouth daily., Disp: 90 tablet, Rfl: 3    Multiple Vitamins-Minerals (BARIATRIC MULTIVITAMINS/IRON PO), Take by mouth., Disp: , Rfl:    traZODone (DESYREL) 50 MG tablet, Take 1 tablet (50 mg total) by mouth at bedtime as needed for sleep., Disp: 90 tablet, Rfl: 0  Observations/Objective: Physical Exam Constitutional:      General: She is not in acute distress.    Appearance: Normal appearance. She is obese. She is not ill-appearing.  Cardiovascular:     Rate and Rhythm: Normal rate.  Pulmonary:     Effort: Pulmonary effort is normal.  Neurological:     General: No focal deficit present.     Mental Status: She is alert and oriented to person, place, and time.  Psychiatric:        Mood and Affect: Mood normal.        Behavior: Behavior normal.        Thought Content: Thought content normal.     Assessment and Plan: Sleep disorder -     traZODone HCl; Take 1 tablet (50 mg total) by mouth at bedtime as needed for sleep.  Dispense: 90 tablet; Refill: 0  GAD (generalized anxiety disorder) Assessment & Plan: Goal d/w pt is to decrease xanax to 1/2 tablet in am, hydroxyxine at night in place of 1/2 tablet xanax pm.  Rx trazodone 50 mg prn insomnia if needed.  Continue lexapro 20 mg once daily    Mild hyperlipidemia Assessment & Plan:  Pt tolerating statin Will repeat next visit      Follow Up Instructions: Return in about 3 months (around 10/20/2022) for f/u anxiety and f/u cholesterol .   I discussed the assessment and treatment plan with the patient. The patient was provided an opportunity to ask questions and all were answered. The patient agreed with the plan and demonstrated an understanding of the instructions.   The patient was advised to call back or seek an in-person evaluation if the symptoms worsen or if the condition fails to improve as anticipated.  The above assessment and management plan was discussed with the patient. The patient verbalized understanding of and has agreed to the management plan. Patient  is aware to call the clinic if symptoms persist or worsen. Patient is aware when to return to the clinic for a follow-up visit. Patient educated on when it is appropriate to go to the emergency department.   Time call ended: 0935  I provided 15 minutes of face-to-face time during this encounter.   Eugenia Pancoast, MSN, APRN, FNP-C [place]

## 2022-07-20 NOTE — Patient Instructions (Signed)
Recommend we start with 1/2 tablet xanax in the am, and replace the half in the pm with hydroxyxine.  Be consistent with this, and if needed can take trazodone for sleep as needed.    Regards,   Eugenia Pancoast FNP-C

## 2022-08-09 ENCOUNTER — Other Ambulatory Visit (HOSPITAL_COMMUNITY): Payer: Self-pay

## 2022-08-24 ENCOUNTER — Other Ambulatory Visit: Payer: Self-pay | Admitting: Family

## 2022-08-24 DIAGNOSIS — F419 Anxiety disorder, unspecified: Secondary | ICD-10-CM

## 2022-08-25 ENCOUNTER — Other Ambulatory Visit (HOSPITAL_COMMUNITY): Payer: Self-pay

## 2022-08-25 MED ORDER — ALPRAZOLAM 1 MG PO TABS
0.5000 mg | ORAL_TABLET | Freq: Every day | ORAL | 1 refills | Status: DC
  Filled 2022-08-25: qty 30, 30d supply, fill #0
  Filled 2022-09-17 – 2022-09-21 (×2): qty 30, 30d supply, fill #1

## 2022-09-09 ENCOUNTER — Other Ambulatory Visit: Payer: Self-pay

## 2022-09-10 ENCOUNTER — Ambulatory Visit: Payer: Commercial Managed Care - PPO | Admitting: Family

## 2022-09-10 ENCOUNTER — Other Ambulatory Visit: Payer: Self-pay | Admitting: Family

## 2022-09-10 ENCOUNTER — Encounter: Payer: Self-pay | Admitting: Family

## 2022-09-10 VITALS — BP 130/88 | HR 78 | Temp 97.9°F | Ht 64.0 in | Wt 227.8 lb

## 2022-09-10 DIAGNOSIS — E785 Hyperlipidemia, unspecified: Secondary | ICD-10-CM

## 2022-09-10 DIAGNOSIS — E559 Vitamin D deficiency, unspecified: Secondary | ICD-10-CM

## 2022-09-10 DIAGNOSIS — E669 Obesity, unspecified: Secondary | ICD-10-CM

## 2022-09-10 DIAGNOSIS — I10 Essential (primary) hypertension: Secondary | ICD-10-CM | POA: Diagnosis not present

## 2022-09-10 DIAGNOSIS — D509 Iron deficiency anemia, unspecified: Secondary | ICD-10-CM

## 2022-09-10 LAB — LIPID PANEL
Cholesterol: 156 mg/dL (ref 0–200)
HDL: 63.8 mg/dL (ref >39.00)
LDL Cholesterol: 81 mg/dL (ref 0–99)
NonHDL: 91.96
Total CHOL/HDL Ratio: 2
Triglycerides: 53 mg/dL (ref 0.0–149.0)
VLDL: 10.6 mg/dL (ref 0.0–40.0)

## 2022-09-10 LAB — HEMOGLOBIN A1C: Hgb A1c MFr Bld: 5.7 % (ref 4.6–6.5)

## 2022-09-10 LAB — VITAMIN D 25 HYDROXY (VIT D DEFICIENCY, FRACTURES): VITD: 29.04 ng/mL — ABNORMAL LOW (ref 30.00–100.00)

## 2022-09-10 MED ORDER — VITAMIN D (ERGOCALCIFEROL) 1.25 MG (50000 UNIT) PO CAPS
50000.0000 [IU] | ORAL_CAPSULE | ORAL | 0 refills | Status: DC
  Filled 2022-09-10: qty 8, 56d supply, fill #0

## 2022-09-10 NOTE — Assessment & Plan Note (Signed)
stable °

## 2022-09-10 NOTE — Progress Notes (Signed)
Established Patient Office Visit  Subjective:      CC:  Chief Complaint  Patient presents with   Medical Management of Chronic Issues    HPI: Michelle Wilson is a 47 y.o. female presenting on 09/10/2022 for Medical Management of Chronic Issues . GAD: last visit in march we discussed decreasing xanax to 1/2 tablet in am and hydroxyxine (which is helping slightly) at night time. Given RX trazodone for prn use. Continued on lexapro 20 mg once daily. She does state she is taking 1/2 tablet alprazolam one to two times daily as is having trouble bringing it down).   HLD: wasn't taking lipitor, had restarted. She is tolerating well.  Lab Results  Component Value Date   CHOL 222 (H) 06/22/2022   HDL 66.60 06/22/2022   LDLCALC 142 (H) 06/22/2022   TRIG 67.0 06/22/2022   CHOLHDL 3 06/22/2022   Menopause: last period was January 2024. She tried black cohash which helped some with hot flashes but she still gets the irritation. She also reports angry easily agitated sensation.       Social history:  Relevant past medical, surgical, family and social history reviewed and updated as indicated. Interim medical history since our last visit reviewed.  Allergies and medications reviewed and updated.  DATA REVIEWED: CHART IN EPIC     ROS: Negative unless specifically indicated above in HPI.    Current Outpatient Medications:    ALPRAZolam (XANAX) 1 MG tablet, Take 1/2-1 tablet (0.5-1 mg total) by mouth daily., Disp: 30 tablet, Rfl: 1   atorvastatin (LIPITOR) 10 MG tablet, Take 1 tablet (10 mg total) by mouth daily., Disp: 90 tablet, Rfl: 3   escitalopram (LEXAPRO) 20 MG tablet, Take 1 tablet (20 mg total) by mouth daily., Disp: 90 tablet, Rfl: 3   hydrOXYzine (VISTARIL) 50 MG capsule, Take 1 capsule (50 mg total) by mouth at bedtime as needed for anxiety, Disp: 30 capsule, Rfl: 2   losartan (COZAAR) 25 MG tablet, Take 1 tablet (25 mg total) by mouth daily., Disp: 90 tablet, Rfl:  3   Multiple Vitamins-Minerals (BARIATRIC MULTIVITAMINS/IRON PO), Take by mouth., Disp: , Rfl:    traZODone (DESYREL) 50 MG tablet, Take 1 tablet (50 mg total) by mouth at bedtime as needed for sleep., Disp: 90 tablet, Rfl: 0      Objective:    BP 130/88   Pulse 78   Temp 97.9 F (36.6 C) (Temporal)   Ht 5\' 4"  (1.626 m)   Wt 227 lb 12.8 oz (103.3 kg)   SpO2 99%   BMI 39.10 kg/m   Wt Readings from Last 3 Encounters:  09/10/22 227 lb 12.8 oz (103.3 kg)  07/20/22 219 lb (99.3 kg)  06/22/22 224 lb 6.4 oz (101.8 kg)    Physical Exam  Physical Exam Constitutional:      General: not in acute distress.    Appearance: Normal appearance. normal weight. is not ill-appearing, toxic-appearing or diaphoretic.  Cardiovascular:     Rate and Rhythm: Normal rate.  Pulmonary:     Effort: Pulmonary effort is normal.  Musculoskeletal:        General: Normal range of motion.  Neurological:     General: No focal deficit present.     Mental Status: alert and oriented to person, place, and time. Mental status is at baseline.  Psychiatric:        Mood and Affect: Mood normal.        Behavior: Behavior normal.  Thought Content: Thought content normal.        Judgment: Judgment normal.        Assessment & Plan:  Obesity (BMI 30-39.9) -     Hemoglobin A1c  Essential hypertension, benign Assessment & Plan: Stable.    Mild hyperlipidemia Assessment & Plan: Continue atorvastatin 10 mg nightly  Ordered lipid panel, pending results. Work on low cholesterol diet and exercise as tolerated   Orders: -     Lipid panel  Vitamin D deficiency Assessment & Plan: Ordered vitamin d pending results.    Orders: -     VITAMIN D 25 Hydroxy (Vit-D Deficiency, Fractures)  Iron deficiency anemia, unspecified iron deficiency anemia type Assessment & Plan: stable      Return in about 6 months (around 03/13/2023) for f/u PAP, f/u CPE, schedule after 12/21.  Mort Sawyers, MSN, APRN,  FNP-C [facility]

## 2022-09-10 NOTE — Assessment & Plan Note (Signed)
Ordered vitamin d pending results.   

## 2022-09-10 NOTE — Assessment & Plan Note (Signed)
Stable

## 2022-09-10 NOTE — Assessment & Plan Note (Signed)
Continue atorvastatin 10 mg nightly  Ordered lipid panel, pending results. Work on low cholesterol diet and exercise as tolerated

## 2022-09-11 ENCOUNTER — Other Ambulatory Visit (HOSPITAL_COMMUNITY): Payer: Self-pay

## 2022-09-17 ENCOUNTER — Ambulatory Visit: Payer: Commercial Managed Care - PPO | Admitting: Family

## 2022-09-17 ENCOUNTER — Other Ambulatory Visit: Payer: Self-pay

## 2022-09-21 ENCOUNTER — Other Ambulatory Visit (HOSPITAL_COMMUNITY): Payer: Self-pay

## 2022-10-30 ENCOUNTER — Other Ambulatory Visit: Payer: Self-pay | Admitting: Family

## 2022-10-30 DIAGNOSIS — F419 Anxiety disorder, unspecified: Secondary | ICD-10-CM

## 2022-11-01 ENCOUNTER — Other Ambulatory Visit: Payer: Self-pay

## 2022-11-01 ENCOUNTER — Other Ambulatory Visit (HOSPITAL_COMMUNITY): Payer: Self-pay

## 2022-11-01 MED ORDER — ALPRAZOLAM 1 MG PO TABS
0.5000 mg | ORAL_TABLET | Freq: Every day | ORAL | 1 refills | Status: DC
  Filled 2022-11-01: qty 30, 30d supply, fill #0
  Filled 2022-12-06: qty 30, 30d supply, fill #1

## 2022-11-18 ENCOUNTER — Encounter (HOSPITAL_COMMUNITY): Payer: Self-pay | Admitting: *Deleted

## 2022-12-01 ENCOUNTER — Encounter: Payer: Self-pay | Admitting: Internal Medicine

## 2022-12-06 ENCOUNTER — Other Ambulatory Visit: Payer: Self-pay | Admitting: Family

## 2022-12-06 ENCOUNTER — Other Ambulatory Visit: Payer: Self-pay

## 2022-12-06 ENCOUNTER — Other Ambulatory Visit (HOSPITAL_COMMUNITY): Payer: Self-pay

## 2022-12-06 DIAGNOSIS — F411 Generalized anxiety disorder: Secondary | ICD-10-CM

## 2022-12-06 MED ORDER — HYDROXYZINE PAMOATE 50 MG PO CAPS
50.0000 mg | ORAL_CAPSULE | Freq: Every evening | ORAL | 2 refills | Status: DC | PRN
  Filled 2022-12-06: qty 30, 30d supply, fill #0
  Filled 2023-01-31: qty 30, 30d supply, fill #1
  Filled 2023-06-01: qty 30, 30d supply, fill #2

## 2022-12-14 ENCOUNTER — Other Ambulatory Visit (HOSPITAL_COMMUNITY): Payer: Self-pay | Admitting: Family

## 2022-12-14 DIAGNOSIS — Z1231 Encounter for screening mammogram for malignant neoplasm of breast: Secondary | ICD-10-CM

## 2023-01-03 ENCOUNTER — Other Ambulatory Visit: Payer: Self-pay | Admitting: Family

## 2023-01-03 ENCOUNTER — Other Ambulatory Visit (HOSPITAL_COMMUNITY): Payer: Self-pay

## 2023-01-03 DIAGNOSIS — F419 Anxiety disorder, unspecified: Secondary | ICD-10-CM

## 2023-01-03 MED ORDER — ALPRAZOLAM 1 MG PO TABS
0.5000 mg | ORAL_TABLET | Freq: Every day | ORAL | 1 refills | Status: DC
  Filled 2023-01-03 – 2023-01-04 (×2): qty 30, 30d supply, fill #0
  Filled 2023-02-02: qty 30, 30d supply, fill #1

## 2023-01-04 ENCOUNTER — Other Ambulatory Visit (HOSPITAL_COMMUNITY): Payer: Self-pay

## 2023-01-27 ENCOUNTER — Other Ambulatory Visit (HOSPITAL_COMMUNITY): Payer: Self-pay

## 2023-01-27 ENCOUNTER — Other Ambulatory Visit: Payer: Self-pay | Admitting: Family

## 2023-01-27 DIAGNOSIS — E785 Hyperlipidemia, unspecified: Secondary | ICD-10-CM

## 2023-01-27 MED ORDER — ATORVASTATIN CALCIUM 10 MG PO TABS
10.0000 mg | ORAL_TABLET | Freq: Every day | ORAL | 3 refills | Status: DC
  Filled 2023-01-27: qty 90, 90d supply, fill #0
  Filled 2023-04-28: qty 90, 90d supply, fill #1
  Filled 2023-07-27: qty 90, 90d supply, fill #2
  Filled 2023-10-25: qty 90, 90d supply, fill #3

## 2023-01-28 ENCOUNTER — Ambulatory Visit (HOSPITAL_COMMUNITY): Payer: Commercial Managed Care - PPO

## 2023-02-02 ENCOUNTER — Other Ambulatory Visit (HOSPITAL_COMMUNITY): Payer: Self-pay

## 2023-02-03 ENCOUNTER — Encounter: Payer: Self-pay | Admitting: Physician Assistant

## 2023-02-03 ENCOUNTER — Telehealth: Payer: Self-pay

## 2023-02-03 NOTE — Telephone Encounter (Signed)
Spoke with patient and per patient request, cancelled PV.  Patient wants to have an office visit due to issues she is having.  Patient will call to see if she can get into OV earlier than my schedule shows.  First available is January.

## 2023-02-10 NOTE — Progress Notes (Signed)
02/11/2023 Michelle Wilson [id number] 1975-11-03  Referring provider: Mort Sawyers, FNP Primary GI doctor: Dr. Meridee Score  ASSESSMENT AND PLAN:   Iron Deficiency Anemia History of heavy menstrual periods and prior low ferritin levels, history of gastric sleeve Currently taking over-the-counter multivitamins, but not a specific iron supplement. Consider starting an iron supplement Most likely related to menstrual periods or possibly gastric sleeve however with IDA and patient's age we will plan on endoscopy and colonoscopy to evaluate at [facility] Will do 1 week linzess prior to colon due to history of constipation I recommend upper gastrointestinal and colorectal evaluation with an EGD and colonoscopy.  Risk of bowel prep, conscious sedation, and EGD and colonoscopy were discussed.  Risks include but are not limited to dehydration, pain, bleeding, cardiopulmonary process, bowel perforation, or other possible adverse outcomes..  Treatment plan was discussed with patient, and agreed upon.  Irritable Bowel Syndrome (IBS) with constipation Long-standing diagnosis with recent increase in bloating and gas pain. Symptoms appear to be stress-related. -Continue current management strategies, add miralax, increase exercise/water/fiber -Consider Linzess for constipation and bloating worsen, will give 1 week prior to colonoscopy.  History of H. Pylori Infection per patient with GERD Treated in the past before her gastric sleeve, but no eradication study performed. -will schedule for EGD to evaluate    Patient Care Team: Mort Sawyers, FNP as PCP - General (Family Medicine) Bella Kennedy, PT as Physical Therapist (Physical Therapy) Fredricka Bonine, PTA as Physical Therapy Assistant (Physical Therapy) Corbin Ade, MD as Consulting Physician (Gastroenterology)  HISTORY OF PRESENT ILLNESS: 47 y.o. female with a past medical history of hypertension, hyperlipidemia, anxiety,  obesity status post gastric sleeve 2017, IDA and others listed below presents for evaluation of colonoscopy.   02/16/2016 UGI unremarkable for prebariatric surgery with Dr. Luretha Murphy  06/22/2022 CBC shows Hgb 11.9 MCV 83.4 normal platelets no leukocytosis, kidney liver function normal.  07/03/2021 ferritin 22, iron 95, normal B12.  Ferritin was 13 03/2021  Discussed the use of AI scribe software for clinical note transcription with the patient, who gave verbal consent to proceed.  The patient, with a past medical history of gastric sleeve surgery in 2017, iron deficiency, and heavy menstrual periods, presents with bloating and gas pain. She reports that these symptoms have been present for the last couple of weeks. The patient does not describe the pain as severe, but more like typical gas pain. She also mentions a past diagnosis of IBS from the early 1990s, which she believes is exacerbated by stress.  The patient also reports a long history of constipation, with bowel movements occurring every two to three days. She has tried various over-the-counter remedies, including magnesium citrate and Miralax, with some success. She denies any unintentional weight loss, blood in the stool, rectal pain, or trouble swallowing.   The patient also mentions a past treatment for H Pylori prior to her gastric sleeve surgery, but no follow-up eradication study was performed. She does not report any symptoms of heartburn or reflux, but does experience belching and bloating, sometimes after eating but more often related to stress levels.  She  reports that she has never smoked. She has been exposed to tobacco smoke. She has never used smokeless tobacco. She reports that she does not drink alcohol and does not use drugs.  RELEVANT LABS AND IMAGING:  Results   LABS Ferritin: 13 ng/mL (2022) Ferritin: 22 ng/mL (2023)      CBC    Component  Value Date/Time   WBC 4.6 06/22/2022 0817   RBC 4.26 06/22/2022 0817    HGB 11.9 (L) 06/22/2022 0817   HCT 35.5 (L) 06/22/2022 0817   PLT 276.0 06/22/2022 0817   MCV 83.4 06/22/2022 0817   MCH 27.8 04/23/2021 0855   MCHC 33.4 06/22/2022 0817   RDW 14.4 06/22/2022 0817   LYMPHSABS 1.4 06/22/2022 0817   MONOABS 0.3 06/22/2022 0817   EOSABS 0.1 06/22/2022 0817   BASOSABS 0.0 06/22/2022 0817   Recent Labs    06/22/22 0817  HGB 11.9*    CMP     Component Value Date/Time   NA 140 06/22/2022 0817   K 4.0 06/22/2022 0817   CL 103 06/22/2022 0817   CO2 33 (H) 06/22/2022 0817   GLUCOSE 79 06/22/2022 0817   BUN 10 06/22/2022 0817   CREATININE 0.77 06/22/2022 0817   CREATININE 0.85 12/19/2020 1044   CALCIUM 9.3 06/22/2022 0817   PROT 7.0 06/22/2022 0817   ALBUMIN 3.6 06/22/2022 0817   AST 17 06/22/2022 0817   ALT 14 06/22/2022 0817   ALKPHOS 101 06/22/2022 0817   BILITOT 0.4 06/22/2022 0817   GFRNONAA 87 06/23/2020 1358   GFRAA 101 06/23/2020 1358      Latest Ref Rng & Units 06/22/2022    8:17 AM 12/24/2021   10:30 AM 07/03/2021    9:16 AM  Hepatic Function  Total Protein 6.0 - 8.3 g/dL 7.0  7.3  6.9   Albumin 3.5 - 5.2 g/dL 3.6  3.7  3.9   AST 0 - 37 U/L 17  18  15    ALT 0 - 35 U/L 14  12  11    Alk Phosphatase 39 - 117 U/L 101  80  85   Total Bilirubin 0.2 - 1.2 mg/dL 0.4  0.5  0.7       Current Medications:    Current Outpatient Medications (Cardiovascular):    atorvastatin (LIPITOR) 10 MG tablet, Take 1 tablet (10 mg total) by mouth daily.   losartan (COZAAR) 25 MG tablet, Take 1 tablet (25 mg total) by mouth daily.     Current Outpatient Medications (Other):    ALPRAZolam (XANAX) 1 MG tablet, Take 1/2-1 tablet (0.5-1 mg total) by mouth daily.   escitalopram (LEXAPRO) 20 MG tablet, Take 1 tablet (20 mg total) by mouth daily.   hydrOXYzine (VISTARIL) 50 MG capsule, Take 1 capsule (50 mg total) by mouth at bedtime as needed for anxiety   linaclotide (LINZESS) 145 MCG CAPS capsule, Take 1 capsule (145 mcg total) by mouth daily  before breakfast.   Multiple Vitamins-Minerals (BARIATRIC MULTIVITAMINS/IRON PO), Take by mouth.   Na Sulfate-K Sulfate-Mg Sulf 17.5-3.13-1.6 GM/177ML SOLN, Take 1 kit by mouth once for 1 dose.   traZODone (DESYREL) 50 MG tablet, Take 1 tablet (50 mg total) by mouth at bedtime as needed for sleep.  Medical History:  Past Medical History:  Diagnosis Date   Anemia    Anxiety    GERD (gastroesophageal reflux disease)    pregnancy   Hypertension    Aldomet 250 mg Q day   IBS (irritable bowel syndrome)    no issues currently   Infection    Yeast inf; no frequent   Allergies:  Allergies  Allergen Reactions   Ambien [Zolpidem] Other (See Comments)    Sleep walking      Surgical History:  She  has a past surgical history that includes Breast reduction surgery (1997); Cesarean section (2011); Bilateral salpingectomy (05/31/2012);  Cesarean section (05/31/2012); Laparoscopic gastric sleeve resection (N/A, 04/12/2016); and Reduction mammaplasty (Bilateral). Family History:  Her family history includes Cancer - Lung in her maternal uncle; Heart disease (age of onset: 40) in her mother; Hyperlipidemia in her mother; Hypertension in her father, maternal grandfather, maternal grandmother, mother, paternal grandfather, and paternal grandmother; Hyperthyroidism in her maternal aunt; Lung cancer in her maternal uncle; Pancreatitis in her father; Schizophrenia in her maternal aunt; Stroke in her maternal grandfather.  REVIEW OF SYSTEMS  : All other systems reviewed and negative except where noted in the History of Present Illness.  PHYSICAL EXAM: BP 120/82   Pulse 75   Ht 5\' 4"  (1.626 m)   Wt 226 lb 2 oz (102.6 kg)   BMI 38.81 kg/m  General Appearance: Well nourished, in no apparent distress. Head:   Normocephalic and atraumatic. Eyes:  sclerae anicteric,conjunctive pink  Respiratory: Respiratory effort normal, BS equal bilaterally without rales, rhonchi, wheezing. Cardio: RRR with no MRGs.  Peripheral pulses intact.  Abdomen: Soft,  Obese ,hypoactive bowel sounds. No tenderness. Without guarding and Without rebound. No masses. Rectal: Not evaluated Musculoskeletal: Full ROM, Normal gait. Without edema. Skin:  Dry and intact without significant lesions or rashes Neuro: Alert and  oriented x4;  No focal deficits. Psych:  Cooperative. Normal mood and affect.    Doree Albee, PA-C 12:57 PM

## 2023-02-11 ENCOUNTER — Ambulatory Visit: Payer: Commercial Managed Care - PPO | Admitting: Physician Assistant

## 2023-02-11 ENCOUNTER — Other Ambulatory Visit (HOSPITAL_COMMUNITY): Payer: Self-pay

## 2023-02-11 ENCOUNTER — Ambulatory Visit (HOSPITAL_COMMUNITY)
Admission: RE | Admit: 2023-02-11 | Discharge: 2023-02-11 | Disposition: A | Discharge status: Home / Self Care | Payer: Commercial Managed Care - PPO | Source: Ambulatory Visit | Attending: Family | Admitting: Family

## 2023-02-11 ENCOUNTER — Encounter: Payer: Self-pay | Admitting: Physician Assistant

## 2023-02-11 VITALS — BP 120/82 | HR 75 | Ht 64.0 in | Wt 226.1 lb

## 2023-02-11 DIAGNOSIS — D509 Iron deficiency anemia, unspecified: Secondary | ICD-10-CM | POA: Diagnosis not present

## 2023-02-11 DIAGNOSIS — Z1231 Encounter for screening mammogram for malignant neoplasm of breast: Secondary | ICD-10-CM | POA: Insufficient documentation

## 2023-02-11 MED ORDER — NA SULFATE-K SULFATE-MG SULF 17.5-3.13-1.6 GM/177ML PO SOLN
1.0000 | Freq: Once | ORAL | 0 refills | Status: AC
  Filled 2023-02-11: qty 354, 2d supply, fill #0

## 2023-02-11 MED ORDER — LINACLOTIDE 145 MCG PO CAPS: 145.0000 ug | ORAL_CAPSULE | Freq: Every day | ORAL | 0 refills | Status: DC

## 2023-02-11 NOTE — Patient Instructions (Addendum)
First do a trial off milk/lactose products if you use them.  Add fiber like benefiber or citracel once a day Increase activity Can do trial of IBGard which is over the counter for AB pain- Take 1-2 capsules once a day for maintence or twice a day during a flare Please try to decrease stress. consider talking with PCP about anti anxiety medication or try head space app for meditation. if any worsening symptoms like blood in stool, weight loss, please call the office   Miralax is an osmotic laxative.  It only brings more water into the stool.  This is safe to take daily.  Can take up to 17 gram of miralax twice a day.  Mix with juice or coffee.  Start 1 capful at night for 3-4 days and reassess your response in 3-4 days.  You can increase and decrease the dose based on your response.  Remember, it can take up to 3-4 days to take effect OR for the effects to wear off.   I often pair this with benefiber in the morning to help assure the stool is not too loose.   For the colon will do linzess the week prior, if you do well with this can consider long term.   You have been scheduled for an endoscopy and colonoscopy. Please follow the written instructions given to you at your visit today.  Please pick up your prep supplies at the pharmacy within the next 1-3 days.  If you use inhalers (even only as needed), please bring them with you on the day of your procedure.  DO NOT TAKE 7 DAYS PRIOR TO TEST- Trulicity (dulaglutide) Ozempic, Wegovy (semaglutide) Mounjaro (tirzepatide) Bydureon Bcise (exanatide extended release)  DO NOT TAKE 1 DAY PRIOR TO YOUR TEST Rybelsus (semaglutide) Adlyxin (lixisenatide) Victoza (liraglutide) Byetta (exanatide) ___________________________________________________________________________    FODMAP stands for fermentable oligo-, di-, mono-saccharides and polyols (1). These are the scientific terms used to classify groups of carbs that are difficult for our  body to digest and that are notorious for triggering digestive symptoms like bloating, gas, loose stools and stomach pain.   You can try low FODMAP diet  - start with eliminating just one column at a time that you feel may be a trigger for you. - the table at the very bottom contains foods that are low in FODMAPs   Sometimes trying to eliminate the FODMAP's from your diet is difficult or tricky, if you are stuggling with trying to do the elimination diet you can try an enzyme.  There is a food enzymes that you sprinkle in or on your food that helps break down the FODMAP. You can read more about the enzyme by going to this site: https://fodzyme.com/  Here some information about pelvic floor dysfunction. This may be contributing to some of your symptoms. We will continue with our evaluation but I do want you to consider adding on fiber supplement with low-dose MiraLAX daily. We could also refer to pelvic floor physical therapy.   Pelvic Floor Dysfunction, Female Pelvic floor dysfunction (PFD) is a condition that results when the group of muscles and connective tissues that support the organs in the pelvis (pelvic floor muscles) do not work well. These muscles and their connections form a sling that supports the colon and bladder. In women, they also support the uterus. PFD causes pelvic floor muscles to be too weak, too tight, or both. In PFD, muscle movements are not coordinated. This may cause bowel or bladder problems. It  may also cause pain. What are the causes? This condition may be caused by an injury to the pelvic area or by a weakening of pelvic muscles. This often results from pregnancy and childbirth or other types of strain. In many cases, the exact cause is not known. What increases the risk? The following factors may make you more likely to develop this condition: Having chronic bladder tissue inflammation (interstitial cystitis). Being an older person. Being  overweight. History of radiation treatment for cancer in the pelvic region. Previous pelvic surgery, such as removal of the uterus (hysterectomy). What are the signs or symptoms? Symptoms of this condition vary and may include: Bladder symptoms, such as: Trouble starting urination and emptying the bladder. Frequent urinary tract infections. Leaking urine when coughing, laughing, or exercising (stress incontinence). Having to pass urine urgently or frequently. Pain when passing urine. Bowel symptoms, such as: Constipation. Urgent or frequent bowel movements. Incomplete bowel movements. Painful bowel movements. Leaking stool or gas. Unexplained genital or rectal pain. Genital or rectal muscle spasms. Low back pain. Other symptoms may include: A heavy, full, or aching feeling in the vagina. A bulge that protrudes into the vagina. Pain during or after sex. How is this diagnosed? This condition may be diagnosed based on: Your symptoms and medical history. A physical exam. During the exam, your health care provider may check your pelvic muscles for tightness, spasm, pain, or weakness. This may include a rectal exam and a pelvic exam. In some cases, you may have diagnostic tests, such as: Electrical muscle function tests. Urine flow testing. X-ray tests of bowel function. Ultrasound of the pelvic organs. How is this treated? Treatment for this condition depends on the symptoms. Treatment options include: Physical therapy. This may include Kegel exercises to help relax or strengthen the pelvic floor muscles. Biofeedback. This type of therapy provides feedback on how tight your pelvic floor muscles are so that you can learn to control them. Internal or external massage therapy. A treatment that involves electrical stimulation of the pelvic floor muscles to help control pain (transcutaneous electrical nerve stimulation, or TENS). Sound wave therapy (ultrasound) to reduce muscle  spasms. Medicines, such as: Muscle relaxants. Bladder control medicines. Surgery to reconstruct or support pelvic floor muscles may be an option if other treatments do not help. Follow these instructions at home: Activity Do your usual activities as told by your health care provider. Ask your health care provider if you should modify any activities. Do pelvic floor strengthening or relaxing exercises at home as told by your physical therapist. Lifestyle Maintain a healthy weight. Eat foods that are high in fiber, such as beans, whole grains, and fresh fruits and vegetables. Limit foods that are high in fat and processed sugars, such as fried or sweet foods. Manage stress with relaxation techniques such as yoga or meditation. General instructions If you have problems with leakage: Use absorbable pads or wear padded underwear. Wash frequently with mild soap. Keep your genital and anal area as clean and dry as possible. Ask your health care provider if you should try a barrier cream to prevent skin irritation. Take warm baths to relieve pelvic muscle tension or spasms. Take over-the-counter and prescription medicines only as told by your health care provider. Keep all follow-up visits. How is this prevented? The cause of PFD is not always known, but there are a few things you can do to reduce the risk of developing this condition, including: Staying at a healthy weight. Getting regular exercise. Managing stress.  Contact a health care provider if: Your symptoms are not improving with home care. You have signs or symptoms of PFD that get worse at home. You develop new signs or symptoms. You have signs of a urinary tract infection, such as: Fever. Chills. Increased urinary frequency. A burning feeling when urinating. You have not had a bowel movement in 3 days (constipation). Summary Pelvic floor dysfunction results when the muscles and connective tissues in your pelvic floor do not  work well. These muscles and their connections form a sling that supports your colon and bladder. In women, they also support the uterus. PFD may be caused by an injury to the pelvic area or by a weakening of pelvic muscles. PFD causes pelvic floor muscles to be too weak, too tight, or a combination of both. Symptoms may vary from person to person. In most cases, PFD can be treated with physical therapies and medicines. Surgery may be an option if other treatments do not help. This information is not intended to replace advice given to you by your health care provider. Make sure you discuss any questions you have with your health care provider. Document Revised: 08/20/2020 Document Reviewed: 08/20/2020 Elsevier Patient Education  2022 [employer].

## 2023-02-14 NOTE — Progress Notes (Signed)
Attending Physician's Attestation   I have reviewed the chart.   I agree with the Advanced Practitioner's note, impression, and recommendations with any updates as below.    Emersyn Wyss Mansouraty, MD [facility] Office # [phone]  

## 2023-03-03 ENCOUNTER — Encounter: Payer: Commercial Managed Care - PPO | Admitting: Internal Medicine

## 2023-03-04 ENCOUNTER — Other Ambulatory Visit (HOSPITAL_COMMUNITY): Payer: Self-pay

## 2023-03-04 ENCOUNTER — Other Ambulatory Visit: Payer: Self-pay | Admitting: Family

## 2023-03-04 DIAGNOSIS — F419 Anxiety disorder, unspecified: Secondary | ICD-10-CM

## 2023-03-04 MED ORDER — ALPRAZOLAM 1 MG PO TABS
0.5000 mg | ORAL_TABLET | Freq: Every day | ORAL | 1 refills | Status: DC
  Filled 2023-03-04: qty 30, 30d supply, fill #0
  Filled 2023-04-06: qty 30, 30d supply, fill #1

## 2023-03-21 ENCOUNTER — Ambulatory Visit (INDEPENDENT_AMBULATORY_CARE_PROVIDER_SITE_OTHER): Payer: Commercial Managed Care - PPO | Admitting: Family

## 2023-03-21 ENCOUNTER — Other Ambulatory Visit (HOSPITAL_COMMUNITY)
Admission: RE | Admit: 2023-03-21 | Discharge: 2023-03-21 | Disposition: A | Discharge status: Home / Self Care | Payer: Commercial Managed Care - PPO | Source: Ambulatory Visit | Attending: Family | Admitting: Family

## 2023-03-21 ENCOUNTER — Encounter: Payer: Self-pay | Admitting: Family

## 2023-03-21 VITALS — BP 132/84 | HR 76 | Temp 97.7°F | Ht 65.0 in | Wt 226.4 lb

## 2023-03-21 DIAGNOSIS — I10 Essential (primary) hypertension: Secondary | ICD-10-CM | POA: Diagnosis not present

## 2023-03-21 DIAGNOSIS — E785 Hyperlipidemia, unspecified: Secondary | ICD-10-CM

## 2023-03-21 DIAGNOSIS — E66812 Obesity, class 2: Secondary | ICD-10-CM | POA: Diagnosis not present

## 2023-03-21 DIAGNOSIS — E7800 Pure hypercholesterolemia, unspecified: Secondary | ICD-10-CM | POA: Diagnosis not present

## 2023-03-21 DIAGNOSIS — Z01419 Encounter for gynecological examination (general) (routine) without abnormal findings: Secondary | ICD-10-CM | POA: Diagnosis not present

## 2023-03-21 DIAGNOSIS — Z1272 Encounter for screening for malignant neoplasm of vagina: Secondary | ICD-10-CM

## 2023-03-21 DIAGNOSIS — F411 Generalized anxiety disorder: Secondary | ICD-10-CM | POA: Diagnosis not present

## 2023-03-21 DIAGNOSIS — Z6838 Body mass index (BMI) 38.0-38.9, adult: Secondary | ICD-10-CM

## 2023-03-21 DIAGNOSIS — R7303 Prediabetes: Secondary | ICD-10-CM | POA: Diagnosis not present

## 2023-03-21 DIAGNOSIS — E559 Vitamin D deficiency, unspecified: Secondary | ICD-10-CM

## 2023-03-21 DIAGNOSIS — D500 Iron deficiency anemia secondary to blood loss (chronic): Secondary | ICD-10-CM

## 2023-03-21 LAB — LIPID PANEL
Cholesterol: 171 mg/dL (ref 0–200)
HDL: 62.4 mg/dL (ref >39.00)
LDL Cholesterol: 93 mg/dL (ref 0–99)
NonHDL: 108.84
Total CHOL/HDL Ratio: 3
Triglycerides: 78 mg/dL (ref 0.0–149.0)
VLDL: 15.6 mg/dL (ref 0.0–40.0)

## 2023-03-21 LAB — CBC
HCT: 34.7 % — ABNORMAL LOW (ref 36.0–46.0)
Hemoglobin: 11 g/dL — ABNORMAL LOW (ref 12.0–15.0)
MCHC: 31.7 g/dL (ref 30.0–36.0)
MCV: 80.5 fL (ref 78.0–100.0)
Platelets: 350 10*3/uL (ref 150.0–400.0)
RBC: 4.32 Mil/uL (ref 3.87–5.11)
RDW: 16.6 % — ABNORMAL HIGH (ref 11.5–15.5)
WBC: 6.1 10*3/uL (ref 4.0–10.5)

## 2023-03-21 LAB — HEMOGLOBIN A1C: Hgb A1c MFr Bld: 6 % (ref 4.6–6.5)

## 2023-03-21 LAB — VITAMIN D 25 HYDROXY (VIT D DEFICIENCY, FRACTURES): VITD: 32.05 ng/mL (ref 30.00–100.00)

## 2023-03-21 NOTE — Assessment & Plan Note (Signed)
Stable Recommended wellbutrin in future if needed Get back with therapy more regularly which may help with anxiety reducing techniques Goal alprazolam to get to prn

## 2023-03-21 NOTE — Assessment & Plan Note (Signed)
Pt verbalized consent for pelvic exam Declines std testing hpv thin prep ordered and pending results Pap exam in office completed, pt tolerated well.   Patient Counseling(The following topics were reviewed):  Preventative care handout given to pt  Health maintenance and immunizations reviewed. Please refer to Health maintenance section. Pt advised on safe sex, wearing seatbelts in car, and proper nutrition labwork ordered today for annual Dental health: Discussed importance of regular tooth brushing, flossing, and dental visits.

## 2023-03-21 NOTE — Assessment & Plan Note (Signed)
Pt advised to work on diet and exercise as tolerated

## 2023-03-21 NOTE — Assessment & Plan Note (Signed)
Ordering cbc ibc ferritin pending results

## 2023-03-21 NOTE — Progress Notes (Signed)
Subjective:  Patient ID: Michelle Wilson, female    DOB: 1975-04-27  Age: 47 y.o. MRN: [id number]  Patient Care Team: Mort Sawyers, FNP as PCP - General (Family Medicine) Bella Kennedy, PT as Physical Therapist (Physical Therapy) Fredricka Bonine, PTA as Physical Therapy Assistant (Physical Therapy) Corbin Ade, MD as Consulting Physician (Gastroenterology)   CC:  Chief Complaint  Patient presents with   Annual Exam    HPI Michelle Wilson is a 47 y.o. female who presents today for a well woman exam. She reports consuming a general diet. The patient does not participate in regular exercise at present. She generally feels well. She reports sleeping fairly well. She does not have additional problems to discuss today.   Vision:Within last year Dental:Receives regular dental care   Wt Readings from Last 3 Encounters:  03/21/23 226 lb 6.4 oz (102.7 kg)  02/11/23 226 lb 2 oz (102.6 kg)  09/10/22 227 lb 12.8 oz (103.3 kg)     Mammogram: 02/11/23  Last pap: 04/15/20 negative Colonoscopy: scheduled for 04/2023, going to have EGD as well for reflux  Pt is without acute concerns.   Vitamin d def: not taking otc supplementation   Menses: only has fallopian tubes out. Periods fluctuate, on and off. Longest amenorrhea was 4 months.   Insomnia: has RX for trazodone 50 mg at bedtime but pt has been hesitant to try it but she plans to. She is still having issues falling asleep and turning off her thoughts at night.   Anxiety: 1/2 tablet bid , this is a 'good balance for her'. She was previously on 1 mg tid so has decreased really well. On lexapro 20 mg once daily. She does see a therapist but has been a while since her last appt. No seizure history   Advanced Directives Patient does not have advanced directives     DEPRESSION SCREENING    03/21/2023    8:20 AM 09/10/2022    7:25 AM 07/20/2022    7:17 AM 06/22/2022    7:39 AM 10/19/2021    4:27 PM 07/03/2021    7:44  AM 04/23/2021    9:14 AM  PHQ 2/9 Scores  PHQ - 2 Score 1 2 2 2  0 2 3  PHQ- 9 Score 1 8  7 3 4 5      ROS: Negative unless specifically indicated above in HPI.    Current Outpatient Medications:    ALPRAZolam (XANAX) 1 MG tablet, Take 1/2-1 tablet (0.5-1 mg total) by mouth daily., Disp: 30 tablet, Rfl: 1   atorvastatin (LIPITOR) 10 MG tablet, Take 1 tablet (10 mg total) by mouth daily., Disp: 90 tablet, Rfl: 3   hydrOXYzine (VISTARIL) 50 MG capsule, Take 1 capsule (50 mg total) by mouth at bedtime as needed for anxiety, Disp: 30 capsule, Rfl: 2   losartan (COZAAR) 25 MG tablet, Take 1 tablet (25 mg total) by mouth daily., Disp: 90 tablet, Rfl: 3   Multiple Vitamins-Minerals (BARIATRIC MULTIVITAMINS/IRON PO), Take by mouth., Disp: , Rfl:    traZODone (DESYREL) 50 MG tablet, Take 1 tablet (50 mg total) by mouth at bedtime as needed for sleep., Disp: 90 tablet, Rfl: 0   escitalopram (LEXAPRO) 20 MG tablet, Take 1 tablet (20 mg total) by mouth daily., Disp: 90 tablet, Rfl: 3    Objective:    BP 132/84 (BP Location: Left Arm, Patient Position: Sitting, Cuff Size: Normal)   Pulse 76   Temp 97.7 F (36.5 C) (Temporal)  Ht 5\' 5"  (1.651 m)   Wt 226 lb 6.4 oz (102.7 kg)   LMP 03/16/2023   SpO2 96%   BMI 37.67 kg/m   BP Readings from Last 3 Encounters:  03/21/23 132/84  02/11/23 120/82  09/10/22 130/88      @PHYSEXAMBYAGE @  Gen: NAD, resting comfortably Breasts: breasts appear normal, no suspicious masses, no skin or nipple changes or axillary nodes Physical Exam Genitourinary:    General: Normal vulva.     Pubic Area: No rash.      Labia:        Right: No rash, tenderness, lesion or injury.        Left: No rash, tenderness, lesion or injury.      Urethra: No prolapse or urethral pain.     Vagina: Normal. No vaginal discharge, tenderness or lesions.     Cervix: No discharge.     Rectum: Normal.  Psych: Normal affect and thought content      Assessment & Plan:   Essential hypertension, benign  GAD (generalized anxiety disorder) Assessment & Plan: Stable Recommended wellbutrin in future if needed Get back with therapy more regularly which may help with anxiety reducing techniques Goal alprazolam to get to prn    Mild hyperlipidemia  Vitamin D deficiency Assessment & Plan: Ordered vitamin d pending results.  Discussed daily vitamin D supplementation, D3 2000 I/U  Orders: -     VITAMIN D 25 Hydroxy (Vit-D Deficiency, Fractures)  Iron deficiency anemia due to chronic blood loss Assessment & Plan: Ordering cbc ibc ferritin pending results  Orders: -     CBC -     IBC + Ferritin; Future  Elevated LDL cholesterol level -     Lipid panel  Prediabetes -     Hemoglobin A1c  Encounter for Papanicolaou smear of vagina as part of routine gynecological examination Assessment & Plan: Pt verbalized consent for pelvic exam Declines std testing hpv thin prep ordered and pending results Pap exam in office completed, pt tolerated well.   Patient Counseling(The following topics were reviewed):  Preventative care handout given to pt  Health maintenance and immunizations reviewed. Please refer to Health maintenance section. Pt advised on safe sex, wearing seatbelts in car, and proper nutrition labwork ordered today for annual Dental health: Discussed importance of regular tooth brushing, flossing, and dental visits.   Orders: -     Cytology - PAP  Class 2 severe obesity due to excess calories with serious comorbidity and body mass index (BMI) of 38.0 to 38.9 in adult [facility]) Assessment & Plan: Pt advised to work on diet and exercise as tolerated          Follow-up: Return in about 6 months (around 09/18/2023).   Mort Sawyers, FNP

## 2023-03-21 NOTE — Assessment & Plan Note (Signed)
Ordered vitamin d pending results.  Discussed daily vitamin D supplementation, D3 2000 I/U

## 2023-03-23 LAB — CYTOLOGY - PAP
Adequacy: ABSENT
Chlamydia: NEGATIVE
Comment: NEGATIVE
Comment: NEGATIVE
Comment: NORMAL
Diagnosis: NEGATIVE
High risk HPV: NEGATIVE
Neisseria Gonorrhea: NEGATIVE

## 2023-04-01 DIAGNOSIS — Z0100 Encounter for examination of eyes and vision without abnormal findings: Secondary | ICD-10-CM | POA: Diagnosis not present

## 2023-04-06 ENCOUNTER — Other Ambulatory Visit: Payer: Self-pay

## 2023-04-28 ENCOUNTER — Other Ambulatory Visit: Payer: Self-pay

## 2023-04-28 ENCOUNTER — Other Ambulatory Visit: Payer: Self-pay | Admitting: Family

## 2023-04-28 DIAGNOSIS — F419 Anxiety disorder, unspecified: Secondary | ICD-10-CM

## 2023-04-29 ENCOUNTER — Encounter: Payer: Self-pay | Admitting: Internal Medicine

## 2023-04-29 ENCOUNTER — Other Ambulatory Visit: Payer: Self-pay | Admitting: Family

## 2023-04-29 ENCOUNTER — Other Ambulatory Visit (HOSPITAL_COMMUNITY): Payer: Self-pay

## 2023-04-29 NOTE — Telephone Encounter (Signed)
 Copied from CRM [id number]. Topic: Clinical - Prescription Issue >> Apr 29, 2023  3:19 PM Corin V wrote: Reason for CRM: Patient calling in regarding her Rx for ALPRAZolam  (XANAX ) 1 MG tablet. Refill request was sent in via MyChart. Agent did not see anything indicating refill had been worked and wanted to verify request was received by clinic.

## 2023-04-29 NOTE — Telephone Encounter (Signed)
 Refill is pending on another encounter. Pt is aware that this is still pending refill.

## 2023-04-29 NOTE — Telephone Encounter (Signed)
 Copied from CRM [id number]. Topic: Clinical - Medication Refill >> Apr 29, 2023  3:18 PM Corin V wrote: Most Recent Primary Care Visit:  Provider: CORWIN ANTU  Department: [facility]  Visit Type: PHYSICAL  Date: 03/21/2023  Medication: ***  Has the patient contacted their pharmacy?  (Agent: If no, request that the patient contact the pharmacy for the refill. If patient does not wish to contact the pharmacy document the reason why and proceed with request.) (Agent: If yes, when and what did the pharmacy advise?)  Is this the correct pharmacy for this prescription?  If no, delete pharmacy and type the correct one.  This is the patient's preferred pharmacy:  [place] [facility] [address] [place] [address] Phone: [phone] Fax: [phone]  [id number] - [place], [place] - [address] AT [facility] OF [address] [place] [address] Phone: [phone] Fax: [phone]   Has the prescription been filled recently?   Is the patient out of the medication?   Has the patient been seen for an appointment in the last year OR does the patient have an upcoming appointment?   Can we respond through MyChart?   Agent: Please be advised that Rx refills may take up to 3 business days. We ask that you follow-up with your pharmacy.

## 2023-05-01 MED ORDER — ALPRAZOLAM 1 MG PO TABS
0.5000 mg | ORAL_TABLET | Freq: Every day | ORAL | 3 refills | Status: DC
  Filled 2023-05-01 – 2023-05-04 (×3): qty 30, 30d supply, fill #0
  Filled 2023-06-05 – 2023-06-06 (×2): qty 30, 30d supply, fill #1
  Filled 2023-07-04: qty 30, 30d supply, fill #2
  Filled 2023-07-31: qty 30, 30d supply, fill #3

## 2023-05-02 ENCOUNTER — Encounter: Payer: Self-pay | Admitting: Certified Registered Nurse Anesthetist

## 2023-05-02 ENCOUNTER — Other Ambulatory Visit: Payer: Self-pay

## 2023-05-02 ENCOUNTER — Other Ambulatory Visit (HOSPITAL_COMMUNITY): Payer: Self-pay

## 2023-05-04 ENCOUNTER — Ambulatory Visit (AMBULATORY_SURGERY_CENTER): Payer: Commercial Managed Care - PPO | Admitting: Internal Medicine

## 2023-05-04 ENCOUNTER — Other Ambulatory Visit (HOSPITAL_COMMUNITY): Payer: Self-pay

## 2023-05-04 ENCOUNTER — Encounter: Payer: Self-pay | Admitting: Internal Medicine

## 2023-05-04 VITALS — BP 158/97 | HR 60 | Temp 97.4°F | Resp 11 | Ht 64.0 in | Wt 226.0 lb

## 2023-05-04 DIAGNOSIS — K6389 Other specified diseases of intestine: Secondary | ICD-10-CM | POA: Diagnosis not present

## 2023-05-04 DIAGNOSIS — F419 Anxiety disorder, unspecified: Secondary | ICD-10-CM | POA: Diagnosis not present

## 2023-05-04 DIAGNOSIS — D123 Benign neoplasm of transverse colon: Secondary | ICD-10-CM | POA: Diagnosis not present

## 2023-05-04 DIAGNOSIS — K2950 Unspecified chronic gastritis without bleeding: Secondary | ICD-10-CM | POA: Diagnosis not present

## 2023-05-04 DIAGNOSIS — Z903 Acquired absence of stomach [part of]: Secondary | ICD-10-CM

## 2023-05-04 DIAGNOSIS — D509 Iron deficiency anemia, unspecified: Secondary | ICD-10-CM | POA: Diagnosis not present

## 2023-05-04 DIAGNOSIS — K648 Other hemorrhoids: Secondary | ICD-10-CM | POA: Diagnosis not present

## 2023-05-04 DIAGNOSIS — Z8619 Personal history of other infectious and parasitic diseases: Secondary | ICD-10-CM

## 2023-05-04 DIAGNOSIS — I10 Essential (primary) hypertension: Secondary | ICD-10-CM | POA: Diagnosis not present

## 2023-05-04 DIAGNOSIS — B9681 Helicobacter pylori [H. pylori] as the cause of diseases classified elsewhere: Secondary | ICD-10-CM

## 2023-05-04 MED ORDER — SODIUM CHLORIDE 0.9 % IV SOLN: 500.0000 mL | Freq: Once | INTRAVENOUS | Status: DC

## 2023-05-04 NOTE — Progress Notes (Signed)
 1331 Robinul 0.1 mg IV given due large amount of secretions upon assessment.  MD made aware. BP 198/112, Labetalol given IV, MD update, vss

## 2023-05-04 NOTE — Progress Notes (Signed)
 Report given to PACU. BP  163/112, Labetalol given IV, MD update, vss

## 2023-05-04 NOTE — Progress Notes (Signed)
 GASTROENTEROLOGY PROCEDURE H&P NOTE   Primary Care Physician: Corwin Antu, FNP    Reason for Procedure:   IDA, hx of H pylori  Plan:    EGD/colon  Patient is appropriate for endoscopic procedure(s) in the ambulatory ([facility]) setting.  The nature of the procedure, as well as the risks, benefits, and alternatives were carefully and thoroughly reviewed with the patient. Ample time for discussion and questions allowed. The patient understood, was satisfied, and agreed to proceed.     HPI: Michelle Wilson is a 48 y.o. female who presents for EGD/colon.  Medical history as below.  Tolerated the prep.  No recent chest pain or shortness of breath.  No abdominal pain today.  Past Medical History:  Diagnosis Date   Anemia    Anxiety    GERD (gastroesophageal reflux disease)    pregnancy   Hypertension    Aldomet  250 mg Q day   IBS (irritable bowel syndrome)    no issues currently   Infection    Yeast inf; no frequent    Past Surgical History:  Procedure Laterality Date   BILATERAL SALPINGECTOMY  05/31/2012   Procedure: BILATERAL SALPINGECTOMY;  Surgeon: Shanda SHAUNNA Muscat, MD;  Location: [facility];  Service: Obstetrics;  Laterality: Bilateral;   BREAST REDUCTION SURGERY  1997   CESAREAN SECTION  2011   CESAREAN SECTION  05/31/2012   Procedure: CESAREAN SECTION;  Surgeon: Shanda SHAUNNA Muscat, MD;  Location: [facility];  Service: Obstetrics;  Laterality: N/A;   LAPAROSCOPIC GASTRIC SLEEVE RESECTION N/A 04/12/2016   Procedure: LAPAROSCOPIC GASTRIC SLEEVE RESECTION, UPPER ENDO;  Surgeon: Donnice Lunger, MD;  Location: [facility];  Service: General;  Laterality: N/A;   REDUCTION MAMMAPLASTY Bilateral     Prior to Admission medications   Medication Sig Start Date End Date Taking? Authorizing Provider  ALPRAZolam  (XANAX ) 1 MG tablet Take 1/2-1 tablet (0.5-1 mg total) by mouth daily. 05/01/23  Yes Dugal, Antu, FNP  atorvastatin  (LIPITOR) 10 MG tablet Take 1 tablet (10 mg total) by mouth daily.  01/27/23  Yes Dugal, Tabitha, FNP  escitalopram  (LEXAPRO ) 20 MG tablet Take 1 tablet (20 mg total) by mouth daily. 06/22/22  Yes Dugal, Antu, FNP  hydrOXYzine  (VISTARIL ) 50 MG capsule Take 1 capsule (50 mg total) by mouth at bedtime as needed for anxiety 12/06/22  Yes Dugal, Tabitha, FNP  losartan  (COZAAR ) 25 MG tablet Take 1 tablet (25 mg total) by mouth daily. 06/22/22  Yes Dugal, Tabitha, FNP  Multiple Vitamins-Minerals (BARIATRIC MULTIVITAMINS/IRON PO) Take by mouth.   Yes [provider]  traZODone  (DESYREL ) 50 MG tablet Take 1 tablet (50 mg total) by mouth at bedtime as needed for sleep. 07/20/22   Corwin Antu, FNP    Current Outpatient Medications  Medication Sig Dispense Refill   ALPRAZolam  (XANAX ) 1 MG tablet Take 1/2-1 tablet (0.5-1 mg total) by mouth daily. 30 tablet 3   atorvastatin  (LIPITOR) 10 MG tablet Take 1 tablet (10 mg total) by mouth daily. 90 tablet 3   escitalopram  (LEXAPRO ) 20 MG tablet Take 1 tablet (20 mg total) by mouth daily. 90 tablet 3   hydrOXYzine  (VISTARIL ) 50 MG capsule Take 1 capsule (50 mg total) by mouth at bedtime as needed for anxiety 30 capsule 2   losartan  (COZAAR ) 25 MG tablet Take 1 tablet (25 mg total) by mouth daily. 90 tablet 3   Multiple Vitamins-Minerals (BARIATRIC MULTIVITAMINS/IRON PO) Take by mouth.     traZODone  (DESYREL ) 50 MG tablet Take 1 tablet (50 mg  total) by mouth at bedtime as needed for sleep. 90 tablet 0   Current Facility-Administered Medications  Medication Dose Route Frequency Provider Last Rate Last Admin   0.9 %  sodium chloride  infusion  500 mL Intravenous Once Nyjah Schwake, Gordy HERO, MD        Allergies as of 05/04/2023 - Review Complete 05/04/2023  Allergen Reaction Noted   Ambien  [zolpidem ] Other (See Comments) 06/22/2022    Family History  Problem Relation Age of Onset   Hypertension Mother    Heart disease Mother 33       Heart attack   Hyperlipidemia Mother    Hypertension Father    Pancreatitis Father     Hypertension Maternal Grandmother    Hypertension Maternal Grandfather    Stroke Maternal Grandfather    Hypertension Paternal Grandmother    Cervical cancer Paternal Grandmother    Hypertension Paternal Grandfather    Hyperthyroidism Maternal Aunt        x 3   Schizophrenia Maternal Aunt    Lung cancer Maternal Uncle    Cancer - Lung Maternal Uncle     Social History   Socioeconomic History   Marital status: Married    Spouse name: Latessa Tillis   Number of children: 2   Years of education: Not on file   Highest education level: Associate degree: academic program  Occupational History   Occupation: Surveyor, [employer]: [employer]    Comment: writer  Tobacco Use   Smoking status: Never    Passive exposure: Past   Smokeless tobacco: Never  Vaping Use   Vaping status: Never Used  Substance and Sexual Activity   Alcohol use: No   Drug use: No   Sexual activity: Yes    Partners: Male    Birth control/protection: Surgical    Comment: Tubes Removed   Other Topics Concern   Not on file  Social History Narrative   61 and 60 year old sons (2024)   Social Drivers of Corporate Investment Banker Strain: Low Risk  (09/09/2022)   Overall Financial Resource Strain (CARDIA)    Difficulty of Paying Living Expenses: Not hard at all  Food Insecurity: No Food Insecurity (09/09/2022)   Hunger Vital Sign    Worried About Running Out of Food in the Last Year: Never true    Ran Out of Food in the Last Year: Never true  Transportation Needs: No Transportation Needs (09/09/2022)   PRAPARE - Administrator, Civil Service (Medical): No    Lack of Transportation (Non-Medical): No  Physical Activity: Insufficiently Active (09/09/2022)   Exercise Vital Sign    Days of Exercise per Week: 3 days    Minutes of Exercise per Session: 30 min  Stress: Stress Concern Present (09/09/2022)   [employer] of Occupational Health - Occupational Stress  Questionnaire    Feeling of Stress : Very much  Social Connections: Socially Integrated (09/09/2022)   Social Connection and Isolation Panel [NHANES]    Frequency of Communication with Friends and Family: Once a week    Frequency of Social Gatherings with Friends and Family: Three times a week    Attends Religious Services: More than 4 times per year    Active Member of Clubs or Organizations: Yes    Attends Banker Meetings: More than 4 times per year    Marital Status: Married  Catering Manager Violence: Not on file    Physical Exam: Vital signs  in last 24 hours: @BP  (!) 183/96 (BP Location: Left Arm, Patient Position: Supine, Cuff Size: Normal)   Pulse 87   Temp (!) 97.4 F (36.3 C)   Ht 5' 4 (1.626 m)   Wt 226 lb (102.5 kg)   SpO2 98%   BMI 38.79 kg/m  GEN: NAD EYE: Sclerae anicteric ENT: MMM CV: Non-tachycardic Pulm: CTA b/l GI: Soft, NT/ND NEURO:  Alert & Oriented x 3   Gordy Starch, MD [facility]  05/04/2023 1:30 PM

## 2023-05-04 NOTE — Patient Instructions (Signed)
 Educational handout provided to patient related to Hemorrhoids and Polyps  Resume previous diet  Continue present medications  Awaiting pathology results  YOU HAD AN ENDOSCOPIC PROCEDURE TODAY AT THE [facility]:   Refer to the procedure report that was given to you for any specific questions about what was found during the examination.  If the procedure report does not answer your questions, please call your gastroenterologist to clarify.  If you requested that your care partner not be given the details of your procedure findings, then the procedure report has been included in a sealed envelope for you to review at your convenience later.  YOU SHOULD EXPECT: Some feelings of bloating in the abdomen. Passage of more gas than usual.  Walking can help get rid of the air that was put into your GI tract during the procedure and reduce the bloating. If you had a lower endoscopy (such as a colonoscopy or flexible sigmoidoscopy) you may notice spotting of blood in your stool or on the toilet paper. If you underwent a bowel prep for your procedure, you may not have a normal bowel movement for a few days.  Please Note:  You might notice some irritation and congestion in your nose or some drainage.  This is from the oxygen used during your procedure.  There is no need for concern and it should clear up in a day or so.  SYMPTOMS TO REPORT IMMEDIATELY:  Following lower endoscopy (colonoscopy or flexible sigmoidoscopy):  Excessive amounts of blood in the stool  Significant tenderness or worsening of abdominal pains  Swelling of the abdomen that is new, acute  Fever of 100F or higher  Following upper endoscopy (EGD)  Vomiting of blood or coffee ground material  New chest pain or pain under the shoulder blades  Painful or persistently difficult swallowing  New shortness of breath  Fever of 100F or higher  Black, tarry-looking stools  For urgent or emergent issues, a gastroenterologist  can be reached at any hour by calling [phone]. Do not use MyChart messaging for urgent concerns.    DIET:  We do recommend a small meal at first, but then you may proceed to your regular diet.  Drink plenty of fluids but you should avoid alcoholic beverages for 24 hours.  ACTIVITY:  You should plan to take it easy for the rest of today and you should NOT DRIVE or use heavy machinery until tomorrow (because of the sedation medicines used during the test).    FOLLOW UP: Our staff will call the number listed on your records the next business day following your procedure.  We will call around 7:15- 8:00 am to check on you and address any questions or concerns that you may have regarding the information given to you following your procedure. If we do not reach you, we will leave a message.     If any biopsies were taken you will be contacted by phone or by letter within the next 1-3 weeks.  Please call us  at [phone] if you have not heard about the biopsies in 3 weeks.    SIGNATURES/CONFIDENTIALITY: You and/or your care partner have signed paperwork which will be entered into your electronic medical record.  These signatures attest to the fact that that the information above on your After Visit Summary has been reviewed and is understood.  Full responsibility of the confidentiality of this discharge information lies with you and/or your care-partner.

## 2023-05-04 NOTE — Op Note (Signed)
 [facility] Patient Name: Michelle Wilson Procedure Date: 05/04/2023 1:26 PM MRN: [id number] Endoscopist: Gordy CHRISTELLA Starch , MD, [id number] Age: 48 Referring MD:  Date of Birth: 04-Sep-1975 Gender: Female Account #: [id number] Procedure:                Colonoscopy Indications:              This is the patient's first colonoscopy, Iron                            deficiency anemia Medicines:                Monitored Anesthesia Care Procedure:                Pre-Anesthesia Assessment:                           - Prior to the procedure, a History and Physical                            was performed, and patient medications and                            allergies were reviewed. The patient's tolerance of                            previous anesthesia was also reviewed. The risks                            and benefits of the procedure and the sedation                            options and risks were discussed with the patient.                            All questions were answered, and informed consent                            was obtained. Prior Anticoagulants: The patient has                            taken no anticoagulant or antiplatelet agents. ASA                            Grade Assessment: II - A patient with mild systemic                            disease. After reviewing the risks and benefits,                            the patient was deemed in satisfactory condition to                            undergo the procedure.  After obtaining informed consent, the colonoscope                            was passed under direct vision. Throughout the                            procedure, the patient's blood pressure, pulse, and                            oxygen saturations were monitored continuously. The                            CF [id number] #[id number] was introduced through the anus                            and advanced to the terminal ileum. The  colonoscopy                            was performed without difficulty. The patient                            tolerated the procedure well. The quality of the                            bowel preparation was excellent. The terminal                            ileum, ileocecal valve, appendiceal orifice, and                            rectum were photographed. Scope In: 1:46:12 PM Scope Out: 1:57:17 PM Scope Withdrawal Time: 0 hours 8 minutes 24 seconds  Total Procedure Duration: 0 hours 11 minutes 5 seconds  Findings:                 The digital rectal exam was normal.                           The terminal ileum appeared normal.                           Two sessile polyps were found in the transverse                            colon. The polyps were 5 to 6 mm in size. These                            polyps were removed with a cold snare. Resection                            and retrieval were complete.                           Internal hemorrhoids were found during  retroflexion. The hemorrhoids were small.                           The exam was otherwise normal throughout the                            examined colon. Complications:            No immediate complications. Estimated Blood Loss:     Estimated blood loss: none. Impression:               - The examined portion of the ileum was normal.                           - Two 5 to 6 mm polyps in the transverse colon,                            removed with a cold snare. Resected and retrieved.                           - Small internal hemorrhoids. Recommendation:           - Patient has a contact number available for                            emergencies. The signs and symptoms of potential                            delayed complications were discussed with the                            patient. Return to normal activities tomorrow.                            Written discharge instructions were  provided to the                            patient.                           - Resume previous diet.                           - Continue present medications.                           - Await pathology results.                           - Replace iron with attention to Hgb and iron                            stores. Source of IDA felt secondary to                            menstruation.                           -  Repeat colonoscopy is recommended. The                            colonoscopy date will be determined after pathology                            results from today's exam become available for                            review. Gordy CHRISTELLA Starch, MD 05/04/2023 2:03:18 PM This report has been signed electronically.

## 2023-05-04 NOTE — Op Note (Signed)
 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 The patient's tolerance of                            previous anesthesia was also reviewed. The risks                            and benefits of the procedure and the sedation                            options and risks were discussed with the patient.                            All questions were answered, and informed consent                            was obtained. Prior Anticoagulants: The patient has                            taken no anticoagulant or antiplatelet agents. ASA                            Grade Assessment: II - A patient with mild systemic                            disease. After reviewing the risks and benefits,                            the patient was deemed in satisfactory condition to                            undergo the procedure.  After obtaining informed consent, the endoscope was                            passed under direct vision. Throughout the                            procedure, the patient's blood pressure, pulse, and                            oxygen saturations were monitored continuously. The                            Olympus scope [id number] was introduced through the                            mouth, and advanced to  the second part of duodenum.                            The upper GI endoscopy was accomplished without                            difficulty. The patient tolerated the procedure                            well. Scope In: Scope Out: Findings:                 The examined esophagus was normal.                           Evidence of a sleeve gastrectomy was found in the                            gastric body. This was characterized by healthy                            appearing mucosa. Biopsies were taken with a cold                            forceps for Helicobacter pylori testing.                           The examined duodenum was normal. Biopsies for                            histology were taken with a cold forceps for                            evaluation of celiac disease. Complications:            No immediate complications. Estimated Blood Loss:     Estimated blood loss was minimal. Impression:               - Normal esophagus.                           -  A sleeve gastrectomy was found, characterized by                            healthy appearing mucosa. Biopsied to exclude H.                            Pylori.                           - Normal examined duodenum. Biopsied. Recommendation:           - Patient has a contact number available for                            emergencies. The signs and symptoms of potential                            delayed complications were discussed with the                            patient. Return to normal activities tomorrow.                            Written discharge instructions were provided to the                            patient.                           - Resume previous diet.                           - Continue present medications.                           - Await pathology results.                           - See the other procedure note for documentation of                            additional recommendations. Gordy CHRISTELLA Starch, MD 05/04/2023 2:00:38 PM This report has been signed electronically.

## 2023-05-04 NOTE — Progress Notes (Signed)
 Pt's states no medical or surgical changes since previsit or office visit.

## 2023-05-04 NOTE — Progress Notes (Signed)
 Called to room to assist during endoscopic procedure.  Patient ID and intended procedure confirmed with present staff. Received instructions for my participation in the procedure from the performing physician.

## 2023-05-05 ENCOUNTER — Telehealth: Payer: Self-pay

## 2023-05-05 NOTE — Telephone Encounter (Signed)
 No answer, left message to call if having any issues or concerns, B.Vale Mousseau RN

## 2023-05-09 ENCOUNTER — Encounter: Payer: Self-pay | Admitting: Internal Medicine

## 2023-05-09 ENCOUNTER — Other Ambulatory Visit (HOSPITAL_COMMUNITY): Payer: Self-pay

## 2023-05-09 ENCOUNTER — Other Ambulatory Visit: Payer: Self-pay | Admitting: *Deleted

## 2023-05-09 LAB — SURGICAL PATHOLOGY

## 2023-05-09 MED ORDER — TALICIA 250-12.5-10 MG PO CPDR
4.0000 | DELAYED_RELEASE_CAPSULE | Freq: Three times a day (TID) | ORAL | 0 refills | Status: AC
  Filled 2023-05-09: qty 168, 14d supply, fill #0

## 2023-05-10 ENCOUNTER — Other Ambulatory Visit (HOSPITAL_COMMUNITY): Payer: Self-pay

## 2023-05-11 ENCOUNTER — Other Ambulatory Visit (HOSPITAL_COMMUNITY): Payer: Self-pay

## 2023-06-05 ENCOUNTER — Other Ambulatory Visit: Payer: Self-pay

## 2023-06-06 ENCOUNTER — Other Ambulatory Visit (HOSPITAL_COMMUNITY): Payer: Self-pay

## 2023-06-06 ENCOUNTER — Other Ambulatory Visit: Payer: Self-pay

## 2023-07-04 ENCOUNTER — Other Ambulatory Visit: Payer: Self-pay

## 2023-07-05 ENCOUNTER — Encounter: Payer: Self-pay | Admitting: Family

## 2023-07-13 ENCOUNTER — Other Ambulatory Visit: Payer: Self-pay | Admitting: *Deleted

## 2023-07-13 DIAGNOSIS — Z8619 Personal history of other infectious and parasitic diseases: Secondary | ICD-10-CM

## 2023-07-31 ENCOUNTER — Other Ambulatory Visit: Payer: Self-pay | Admitting: Family

## 2023-07-31 DIAGNOSIS — I10 Essential (primary) hypertension: Secondary | ICD-10-CM

## 2023-07-31 DIAGNOSIS — F411 Generalized anxiety disorder: Secondary | ICD-10-CM

## 2023-08-01 ENCOUNTER — Other Ambulatory Visit (HOSPITAL_COMMUNITY): Payer: Self-pay

## 2023-08-01 ENCOUNTER — Other Ambulatory Visit: Payer: Self-pay

## 2023-08-01 MED ORDER — HYDROXYZINE PAMOATE 50 MG PO CAPS
50.0000 mg | ORAL_CAPSULE | Freq: Every evening | ORAL | 2 refills | Status: AC | PRN
  Filled 2023-08-01: qty 30, 30d supply, fill #0
  Filled 2023-11-11: qty 30, 30d supply, fill #1
  Filled 2024-04-17: qty 30, 30d supply, fill #2

## 2023-08-01 MED ORDER — LOSARTAN POTASSIUM 25 MG PO TABS
25.0000 mg | ORAL_TABLET | Freq: Every day | ORAL | 3 refills | Status: DC
Start: 2023-08-01 — End: 2023-08-09
  Filled 2023-08-01: qty 90, 90d supply, fill #0

## 2023-08-09 ENCOUNTER — Encounter: Payer: Self-pay | Admitting: Family

## 2023-08-09 ENCOUNTER — Other Ambulatory Visit (HOSPITAL_COMMUNITY): Payer: Self-pay

## 2023-08-09 DIAGNOSIS — I10 Essential (primary) hypertension: Secondary | ICD-10-CM

## 2023-08-09 MED ORDER — LOSARTAN POTASSIUM 50 MG PO TABS
50.0000 mg | ORAL_TABLET | Freq: Every day | ORAL | 3 refills | Status: DC
  Filled 2023-08-09: qty 90, 90d supply, fill #0

## 2023-08-17 ENCOUNTER — Other Ambulatory Visit (HOSPITAL_COMMUNITY): Payer: Self-pay

## 2023-08-17 MED ORDER — LOSARTAN POTASSIUM 100 MG PO TABS
100.0000 mg | ORAL_TABLET | Freq: Every day | ORAL | 3 refills | Status: AC
  Filled 2023-08-17: qty 90, 90d supply, fill #0
  Filled 2023-11-11: qty 90, 90d supply, fill #1
  Filled 2024-02-08: qty 90, 90d supply, fill #2
  Filled 2024-05-11: qty 90, 90d supply, fill #3

## 2023-08-17 NOTE — Addendum Note (Signed)
 Addended by: Felicita Horns on: 08/17/2023 01:02 PM   Modules accepted: Orders

## 2023-08-30 ENCOUNTER — Other Ambulatory Visit (HOSPITAL_COMMUNITY): Payer: Self-pay

## 2023-08-30 ENCOUNTER — Other Ambulatory Visit: Payer: Self-pay | Admitting: Family

## 2023-08-30 DIAGNOSIS — F419 Anxiety disorder, unspecified: Secondary | ICD-10-CM

## 2023-08-30 DIAGNOSIS — F411 Generalized anxiety disorder: Secondary | ICD-10-CM

## 2023-08-30 MED ORDER — ALPRAZOLAM 1 MG PO TABS
0.5000 mg | ORAL_TABLET | Freq: Every day | ORAL | 3 refills | Status: DC
  Filled 2023-08-30: qty 30, 30d supply, fill #0
  Filled 2023-09-26 – 2023-09-28 (×3): qty 30, 30d supply, fill #1
  Filled 2023-10-25 – 2023-10-26 (×3): qty 30, 30d supply, fill #2
  Filled 2023-11-28: qty 30, 30d supply, fill #0
  Filled 2023-11-28: qty 30, 30d supply, fill #3

## 2023-08-30 MED ORDER — ESCITALOPRAM OXALATE 20 MG PO TABS
20.0000 mg | ORAL_TABLET | Freq: Every day | ORAL | 3 refills | Status: AC
  Filled 2023-08-30 – 2023-11-28 (×2): qty 90, 90d supply, fill #0
  Filled 2023-11-28 – 2024-02-26 (×2): qty 90, 90d supply, fill #1
  Filled 2024-05-22: qty 90, 90d supply, fill #2

## 2023-09-23 ENCOUNTER — Other Ambulatory Visit (HOSPITAL_COMMUNITY): Payer: Self-pay

## 2023-09-23 ENCOUNTER — Encounter: Payer: Self-pay | Admitting: Family

## 2023-09-23 ENCOUNTER — Ambulatory Visit: Admitting: Family

## 2023-09-23 VITALS — BP 134/86 | HR 78 | Temp 98.0°F | Ht 65.0 in | Wt 227.2 lb

## 2023-09-23 DIAGNOSIS — E876 Hypokalemia: Secondary | ICD-10-CM

## 2023-09-23 DIAGNOSIS — E559 Vitamin D deficiency, unspecified: Secondary | ICD-10-CM | POA: Diagnosis not present

## 2023-09-23 DIAGNOSIS — E66812 Obesity, class 2: Secondary | ICD-10-CM

## 2023-09-23 DIAGNOSIS — F411 Generalized anxiety disorder: Secondary | ICD-10-CM

## 2023-09-23 DIAGNOSIS — G479 Sleep disorder, unspecified: Secondary | ICD-10-CM | POA: Diagnosis not present

## 2023-09-23 DIAGNOSIS — Z6838 Body mass index (BMI) 38.0-38.9, adult: Secondary | ICD-10-CM | POA: Diagnosis not present

## 2023-09-23 DIAGNOSIS — E7800 Pure hypercholesterolemia, unspecified: Secondary | ICD-10-CM | POA: Diagnosis not present

## 2023-09-23 DIAGNOSIS — I10 Essential (primary) hypertension: Secondary | ICD-10-CM | POA: Diagnosis not present

## 2023-09-23 DIAGNOSIS — R7303 Prediabetes: Secondary | ICD-10-CM | POA: Diagnosis not present

## 2023-09-23 DIAGNOSIS — D500 Iron deficiency anemia secondary to blood loss (chronic): Secondary | ICD-10-CM

## 2023-09-23 LAB — MICROALBUMIN / CREATININE URINE RATIO
Creatinine,U: 186 mg/dL
Microalb Creat Ratio: 6.7 mg/g (ref 0.0–30.0)
Microalb, Ur: 1.2 mg/dL (ref 0.0–1.9)

## 2023-09-23 LAB — COMPREHENSIVE METABOLIC PANEL WITH GFR
ALT: 45 U/L — ABNORMAL HIGH (ref 0–35)
AST: 43 U/L — ABNORMAL HIGH (ref 0–37)
Albumin: 4.1 g/dL (ref 3.5–5.2)
Alkaline Phosphatase: 307 U/L — ABNORMAL HIGH (ref 39–117)
BUN: 9 mg/dL (ref 6–23)
CO2: 29 meq/L (ref 19–32)
Calcium: 9.1 mg/dL (ref 8.4–10.5)
Chloride: 105 meq/L (ref 96–112)
Creatinine, Ser: 0.91 mg/dL (ref 0.40–1.20)
GFR: 75.2 mL/min (ref >60.00)
Glucose, Bld: 80 mg/dL (ref 70–99)
Potassium: 3.7 meq/L (ref 3.5–5.1)
Sodium: 142 meq/L (ref 135–145)
Total Bilirubin: 0.7 mg/dL (ref 0.2–1.2)
Total Protein: 7.3 g/dL (ref 6.0–8.3)

## 2023-09-23 LAB — LIPID PANEL
Cholesterol: 168 mg/dL (ref 0–200)
HDL: 74.4 mg/dL (ref >39.00)
LDL Cholesterol: 84 mg/dL (ref 0–99)
NonHDL: 93.6
Total CHOL/HDL Ratio: 2
Triglycerides: 49 mg/dL (ref 0.0–149.0)
VLDL: 9.8 mg/dL (ref 0.0–40.0)

## 2023-09-23 LAB — CBC
HCT: 34.5 % — ABNORMAL LOW (ref 36.0–46.0)
Hemoglobin: 11.3 g/dL — ABNORMAL LOW (ref 12.0–15.0)
MCHC: 32.8 g/dL (ref 30.0–36.0)
MCV: 81.9 fl (ref 78.0–100.0)
Platelets: 253 10*3/uL (ref 150.0–400.0)
RBC: 4.22 Mil/uL (ref 3.87–5.11)
RDW: 15.8 % — ABNORMAL HIGH (ref 11.5–15.5)
WBC: 4.4 10*3/uL (ref 4.0–10.5)

## 2023-09-23 LAB — HEMOGLOBIN A1C: Hgb A1c MFr Bld: 5.6 % (ref 4.6–6.5)

## 2023-09-23 LAB — TSH: TSH: 0.58 u[IU]/mL (ref 0.35–5.50)

## 2023-09-23 LAB — VITAMIN D 25 HYDROXY (VIT D DEFICIENCY, FRACTURES): VITD: 24.86 ng/mL — ABNORMAL LOW (ref 30.00–100.00)

## 2023-09-23 MED ORDER — TRAZODONE HCL 50 MG PO TABS
50.0000 mg | ORAL_TABLET | Freq: Every evening | ORAL | 1 refills | Status: DC
Start: 2023-09-23 — End: 2023-11-11
  Filled 2023-09-23: qty 30, 15d supply, fill #0
  Filled 2023-10-25: qty 30, 15d supply, fill #1

## 2023-09-23 NOTE — Assessment & Plan Note (Signed)
 Pt advised of the following: Work on a diabetic diet, try to incorporate exercise at least 20-30 a day for 3 days a week or more.

## 2023-09-23 NOTE — Assessment & Plan Note (Signed)
 Trial again with trazodone  50-100 mg po at bedtime

## 2023-09-23 NOTE — Assessment & Plan Note (Signed)
 Pt advised to work on diet and exercise as tolerated Referral to healthy weight and wellness.  Discussed options.  Glp 1 not covered with [place] insurance currently.

## 2023-09-23 NOTE — Progress Notes (Signed)
 Established Patient Office Visit  Subjective:      CC:  Chief Complaint  Patient presents with   Medical Management of Chronic Issues    HPI: Michelle Wilson is a 48 y.o. female presenting on 09/23/2023 for Medical Management of Chronic Issues  Menopausal symptoms, have been pretty rough. She is looking into 'menopausal classes' with work.  Had period for four months, then came back in may. Hot flashes have toned down slightly. Was taking black cohash and was slightly helpful.   Insomnia: did try trazodone  a few times but she states that it wasn't helpful at the 50 mg once daily. Averages at night 4-5 hours. She wakes up to pee at night, thinks might be relative to peri menopausal state. Has tried to decrease her fluid intake prior to bed. Denies snoring.   Exercise: walks when she can. Aerobics at times.      Social history:  Relevant past medical, surgical, family and social history reviewed and updated as indicated. Interim medical history since our last visit reviewed.  Allergies and medications reviewed and updated.  DATA REVIEWED: CHART IN EPIC     ROS: Negative unless specifically indicated above in HPI.    Current Outpatient Medications:    ALPRAZolam  (XANAX ) 1 MG tablet, Take 1/2-1 tablet (0.5-1 mg total) by mouth daily., Disp: 30 tablet, Rfl: 3   atorvastatin  (LIPITOR) 10 MG tablet, Take 1 tablet (10 mg total) by mouth daily., Disp: 90 tablet, Rfl: 3   escitalopram  (LEXAPRO ) 20 MG tablet, Take 1 tablet (20 mg total) by mouth daily., Disp: 90 tablet, Rfl: 3   hydrOXYzine  (VISTARIL ) 50 MG capsule, Take 1 capsule (50 mg total) by mouth at bedtime as needed for anxiety, Disp: 30 capsule, Rfl: 2   losartan  (COZAAR ) 100 MG tablet, Take 1 tablet (100 mg total) by mouth daily., Disp: 90 tablet, Rfl: 3   Multiple Vitamins-Minerals (BARIATRIC MULTIVITAMINS/IRON PO), Take by mouth., Disp: , Rfl:    traZODone  (DESYREL ) 50 MG tablet, Take 1-2 tablets (50-100 mg total)  by mouth at bedtime for insomnia., Disp: 30 tablet, Rfl: 1      Objective:     BP 134/86 (BP Location: Left Arm, Patient Position: Sitting, Cuff Size: Large)   Pulse 78   Temp 98 F (36.7 C) (Temporal)   Ht 5\' 5"  (1.651 m)   Wt 227 lb 3.2 oz (103.1 kg)   LMP 09/07/2023 (Approximate)   SpO2 98%   BMI 37.81 kg/m   Wt Readings from Last 3 Encounters:  09/23/23 227 lb 3.2 oz (103.1 kg)  05/04/23 226 lb (102.5 kg)  03/21/23 226 lb 6.4 oz (102.7 kg)    Physical Exam Vitals reviewed.  Constitutional:      General: She is not in acute distress.    Appearance: Normal appearance. She is obese. She is not ill-appearing, toxic-appearing or diaphoretic.  HENT:     Head: Normocephalic.  Cardiovascular:     Rate and Rhythm: Normal rate and regular rhythm.  Pulmonary:     Effort: Pulmonary effort is normal.  Musculoskeletal:        General: Normal range of motion.  Neurological:     General: No focal deficit present.     Mental Status: She is alert and oriented to person, place, and time. Mental status is at baseline.  Psychiatric:        Mood and Affect: Mood normal.        Behavior: Behavior normal.  Thought Content: Thought content normal.        Judgment: Judgment normal.           Assessment & Plan:  Class 2 severe obesity due to excess calories with serious comorbidity and body mass index (BMI) of 38.0 to 38.9 in adult [facility]) Assessment & Plan: Pt advised to work on diet and exercise as tolerated Referral to healthy weight and wellness.  Discussed options.  Glp 1 not covered with [place] insurance currently.   Orders: -     TSH -     Amb Ref to Medical Weight Management  GAD (generalized anxiety disorder) Assessment & Plan: Stable Continue lexapro  20 mg once daily    Prediabetes Assessment & Plan: Pt advised of the following: Work on a diabetic diet, try to incorporate exercise at least 20-30 a day for 3 days a week or more.    Orders: -      Hemoglobin A1c -     Comprehensive metabolic panel with GFR -     Amb Ref to Medical Weight Management  Sleep difficulties Assessment & Plan: Trial again with trazodone  50-100 mg po at bedtime    Orders: -     traZODone  HCl; Take 1-2 tablets (50-100 mg total) by mouth at bedtime for insomnia.  Dispense: 30 tablet; Refill: 1  Essential hypertension, benign Assessment & Plan: Stable. Continue medications as prescribed.   Orders: -     Microalbumin / creatinine urine ratio -     Amb Ref to Medical Weight Management  Vitamin D  deficiency Assessment & Plan: Ordered vitamin d  pending results.    Orders: -     VITAMIN D  25 Hydroxy (Vit-D Deficiency, Fractures)  Iron deficiency anemia due to chronic blood loss -     CBC -     IBC + Ferritin; Future  Elevated LDL cholesterol level Assessment & Plan: Ordered lipid panel, pending results. Work on low cholesterol diet and exercise as tolerated   Orders: -     Lipid panel -     Amb Ref to Medical Weight Management  Hypokalemia -     Comprehensive metabolic panel with GFR     Return in about 6 months (around 03/25/2024) for f/u CPE.  Felicita Horns, MSN, APRN, FNP-C [facility]

## 2023-09-23 NOTE — Patient Instructions (Signed)
Please work on the following to help with sleep:  -Sleep only long enough to feel rested then get out of bed -Go to bed and get up at the same time every day. -Do not try to force yourself to sleep. If you can't sleep, get out of bed adn try again later. -Have coffee, tea, and other foods that have caffeine only in the morning. -Avoid alcohol -Keep your bedroom dark, cool, quiet, and free of reminders of work or other things that cause you stress -Exercise several days a week, but not right before bed -Avoid looking at phones or reading devices ("e-books") that give off light before bed. This can make it harder to fall asleep  

## 2023-09-23 NOTE — Assessment & Plan Note (Signed)
 Stable.  Continue lexapro 20 mg once daily.

## 2023-09-25 NOTE — Assessment & Plan Note (Signed)
 Ordered lipid panel, pending results. Work on low cholesterol diet and exercise as tolerated

## 2023-09-25 NOTE — Assessment & Plan Note (Signed)
 Stable  Continue medications as prescribed.

## 2023-09-25 NOTE — Assessment & Plan Note (Signed)
 Ordered vitamin d pending results.

## 2023-09-26 ENCOUNTER — Other Ambulatory Visit (HOSPITAL_COMMUNITY): Payer: Self-pay

## 2023-09-26 ENCOUNTER — Ambulatory Visit: Payer: Self-pay | Admitting: Family

## 2023-09-26 DIAGNOSIS — E559 Vitamin D deficiency, unspecified: Secondary | ICD-10-CM

## 2023-09-26 DIAGNOSIS — R7989 Other specified abnormal findings of blood chemistry: Secondary | ICD-10-CM

## 2023-09-26 MED ORDER — CHOLECALCIFEROL 1.25 MG (50000 UT) PO CAPS
50000.0000 [IU] | ORAL_CAPSULE | ORAL | 0 refills | Status: DC
  Filled 2023-09-26 – 2023-09-27 (×2): qty 8, 56d supply, fill #0

## 2023-09-26 NOTE — Progress Notes (Signed)
 Are we able to add hepatitis panel to labs?

## 2023-09-27 ENCOUNTER — Other Ambulatory Visit (INDEPENDENT_AMBULATORY_CARE_PROVIDER_SITE_OTHER)

## 2023-09-27 ENCOUNTER — Ambulatory Visit (HOSPITAL_BASED_OUTPATIENT_CLINIC_OR_DEPARTMENT_OTHER)
Admission: RE | Admit: 2023-09-27 | Discharge: 2023-09-27 | Disposition: A | Discharge status: Home / Self Care | Source: Ambulatory Visit | Attending: Family | Admitting: Family

## 2023-09-27 ENCOUNTER — Encounter: Payer: Self-pay | Admitting: Family

## 2023-09-27 ENCOUNTER — Encounter (HOSPITAL_BASED_OUTPATIENT_CLINIC_OR_DEPARTMENT_OTHER): Payer: Self-pay

## 2023-09-27 ENCOUNTER — Other Ambulatory Visit: Payer: Self-pay

## 2023-09-27 ENCOUNTER — Other Ambulatory Visit (HOSPITAL_COMMUNITY): Payer: Self-pay

## 2023-09-27 DIAGNOSIS — R7989 Other specified abnormal findings of blood chemistry: Secondary | ICD-10-CM | POA: Diagnosis not present

## 2023-09-27 DIAGNOSIS — D500 Iron deficiency anemia secondary to blood loss (chronic): Secondary | ICD-10-CM | POA: Diagnosis not present

## 2023-09-27 LAB — HEPATIC FUNCTION PANEL
ALT: 51 U/L — ABNORMAL HIGH (ref 0–35)
AST: 51 U/L — ABNORMAL HIGH (ref 0–37)
Albumin: 4.1 g/dL (ref 3.5–5.2)
Alkaline Phosphatase: 300 U/L — ABNORMAL HIGH (ref 39–117)
Bilirubin, Direct: 0.2 mg/dL (ref 0.0–0.3)
Total Bilirubin: 0.6 mg/dL (ref 0.2–1.2)
Total Protein: 8 g/dL (ref 6.0–8.3)

## 2023-09-27 LAB — IBC + FERRITIN
Ferritin: 11.6 ng/mL (ref 10.0–291.0)
Iron: 56 ug/dL (ref 42–145)
Saturation Ratios: 13.7 % — ABNORMAL LOW (ref 20.0–50.0)
TIBC: 407.4 ug/dL (ref 250.0–450.0)
Transferrin: 291 mg/dL (ref 212.0–360.0)

## 2023-09-28 ENCOUNTER — Other Ambulatory Visit: Payer: Self-pay | Admitting: Family

## 2023-09-28 ENCOUNTER — Ambulatory Visit (HOSPITAL_COMMUNITY)
Admission: RE | Admit: 2023-09-28 | Discharge: 2023-09-28 | Disposition: A | Discharge status: Home / Self Care | Source: Ambulatory Visit | Attending: Family | Admitting: Family

## 2023-09-28 ENCOUNTER — Ambulatory Visit: Payer: Self-pay | Admitting: Family

## 2023-09-28 ENCOUNTER — Encounter: Payer: Self-pay | Admitting: *Deleted

## 2023-09-28 ENCOUNTER — Other Ambulatory Visit (HOSPITAL_COMMUNITY): Payer: Self-pay

## 2023-09-28 DIAGNOSIS — R7989 Other specified abnormal findings of blood chemistry: Secondary | ICD-10-CM | POA: Diagnosis not present

## 2023-09-28 DIAGNOSIS — K8020 Calculus of gallbladder without cholecystitis without obstruction: Secondary | ICD-10-CM

## 2023-09-28 DIAGNOSIS — K838 Other specified diseases of biliary tract: Secondary | ICD-10-CM

## 2023-09-28 DIAGNOSIS — R748 Abnormal levels of other serum enzymes: Secondary | ICD-10-CM

## 2023-09-28 LAB — HEPATITIS PANEL, ACUTE
Hep A IgM: NONREACTIVE
Hep B C IgM: NONREACTIVE
Hepatitis B Surface Ag: NONREACTIVE
Hepatitis C Ab: NONREACTIVE

## 2023-10-03 ENCOUNTER — Other Ambulatory Visit (HOSPITAL_COMMUNITY)

## 2023-10-04 DIAGNOSIS — K8020 Calculus of gallbladder without cholecystitis without obstruction: Secondary | ICD-10-CM | POA: Diagnosis not present

## 2023-10-04 DIAGNOSIS — R7989 Other specified abnormal findings of blood chemistry: Secondary | ICD-10-CM | POA: Diagnosis not present

## 2023-10-04 DIAGNOSIS — Z9884 Bariatric surgery status: Secondary | ICD-10-CM | POA: Diagnosis not present

## 2023-10-06 ENCOUNTER — Other Ambulatory Visit (HOSPITAL_COMMUNITY): Payer: Self-pay | Admitting: General Surgery

## 2023-10-06 DIAGNOSIS — K8020 Calculus of gallbladder without cholecystitis without obstruction: Secondary | ICD-10-CM

## 2023-10-07 ENCOUNTER — Encounter: Payer: Self-pay | Admitting: General Surgery

## 2023-10-07 ENCOUNTER — Other Ambulatory Visit: Payer: Self-pay

## 2023-10-07 ENCOUNTER — Ambulatory Visit (HOSPITAL_COMMUNITY)
Admission: RE | Admit: 2023-10-07 | Discharge: 2023-10-07 | Disposition: A | Discharge status: Home / Self Care | Source: Ambulatory Visit | Attending: General Surgery | Admitting: General Surgery

## 2023-10-07 ENCOUNTER — Encounter: Payer: Self-pay | Admitting: Gastroenterology

## 2023-10-07 ENCOUNTER — Other Ambulatory Visit (HOSPITAL_COMMUNITY): Payer: Self-pay | Admitting: General Surgery

## 2023-10-07 DIAGNOSIS — K8050 Calculus of bile duct without cholangitis or cholecystitis without obstruction: Secondary | ICD-10-CM

## 2023-10-07 DIAGNOSIS — K8020 Calculus of gallbladder without cholecystitis without obstruction: Secondary | ICD-10-CM

## 2023-10-07 DIAGNOSIS — K8081 Other cholelithiasis with obstruction: Secondary | ICD-10-CM

## 2023-10-07 DIAGNOSIS — K838 Other specified diseases of biliary tract: Secondary | ICD-10-CM | POA: Diagnosis not present

## 2023-10-07 DIAGNOSIS — K8070 Calculus of gallbladder and bile duct without cholecystitis without obstruction: Secondary | ICD-10-CM | POA: Diagnosis not present

## 2023-10-07 DIAGNOSIS — R7989 Other specified abnormal findings of blood chemistry: Secondary | ICD-10-CM | POA: Diagnosis not present

## 2023-10-07 MED ORDER — GADOBUTROL 1 MMOL/ML IV SOLN
10.0000 mL | Freq: Once | INTRAVENOUS | Status: AC | PRN
Start: 2023-10-07 — End: 2023-10-07

## 2023-10-07 NOTE — Progress Notes (Signed)
 This patient's case has recently been reviewed by me with Dr. Elvan Hamel. After discussion recently decision was made to go forward with MRI/MRCP to evaluate the biliary duct further before upcoming cholecystectomy. MRCP shows significant choledocholithiasis as well as significant cholelithiasis. Patient's main symptom has been nausea without significant/severe biliary colic. At this point, it does make sense for us  to move forward with ERCP. Based on availability of myself and my colleagues this is not going to be easy as an outpatient but we will do our best to accommodate. I will have my team work on scheduling this, though it will likely be in early July. If she starts to develop significant episodes of pain discomfort or certainly any evidence of jaundice, she will need to come into the hospital for ERCP. The significant burden of stones in her bile duct, shows the chronicity of her symptoms.   Yong Henle, MD [facility] Office # [phone]

## 2023-10-07 NOTE — Progress Notes (Signed)
 ERCP has been entered for 10/31/23 t 1245 pm at [facility] with GM

## 2023-10-07 NOTE — Pre-Procedure Instructions (Signed)
 Surgical Instructions   Your procedure is scheduled on Friday, June 27th. Report to [facility] Main Entrance A at 05:30 A.M., then check in with the Admitting office. Any questions or running late day of surgery: call [phone]  Questions prior to your surgery date: call [phone], Monday-Friday, 8am-4pm. If you experience any cold or flu symptoms such as cough, fever, chills, shortness of breath, etc. between now and your scheduled surgery, please notify us  at the above number.     Remember:  Do not eat after midnight the night before your surgery   You may drink clear liquids until 04:30 AM the morning of your surgery.   Clear liquids allowed are: Water, Non-Citrus Juices (without pulp), Carbonated Beverages, Clear Tea (no milk, honey, etc.), Black Coffee Only (NO MILK, CREAM OR POWDERED CREAMER of any kind), and Gatorade.    Take these medicines the morning of surgery with A SIP OF WATER  atorvastatin  (LIPITOR)  escitalopram  (LEXAPRO )   May take these medicines IF NEEDED: ALPRAZolam  (XANAX )    One week prior to surgery, STOP taking any Aspirin (unless otherwise instructed by your surgeon) Aleve, Naproxen, Ibuprofen , Motrin , Advil , Goody's, BC's, all herbal medications, fish oil, and non-prescription vitamins.                     Do NOT Smoke (Tobacco/Vaping) for 24 hours prior to your procedure.  If you use a CPAP at night, you may bring your mask/headgear for your overnight stay.   You will be asked to remove any contacts, glasses, piercing's, hearing aid's, dentures/partials prior to surgery. Please bring cases for these items if needed.    Patients discharged the day of surgery will not be allowed to drive home, and someone needs to stay with them for 24 hours.  SURGICAL WAITING ROOM VISITATION Patients may have no more than 2 support people in the waiting area - these visitors may rotate.   Pre-op nurse will coordinate an appropriate time for 1 ADULT support  person, who may not rotate, to accompany patient in pre-op.  Children under the age of 44 must have an adult with them who is not the patient and must remain in the main waiting area with an adult.  If the patient needs to stay at the hospital during part of their recovery, the visitor guidelines for inpatient rooms apply.  Please refer to the [facility] website for the visitor guidelines for any additional information.   If you received a COVID test during your pre-op visit  it is requested that you wear a mask when out in public, stay away from anyone that may not be feeling well and notify your surgeon if you develop symptoms. If you have been in contact with anyone that has tested positive in the last 10 days please notify you surgeon.      Pre-operative CHG Bathing Instructions   You can play a key role in reducing the risk of infection after surgery. Your skin needs to be as free of germs as possible. You can reduce the number of germs on your skin by washing with CHG (chlorhexidine  gluconate) soap before surgery. CHG is an antiseptic soap that kills germs and continues to kill germs even after washing.   DO NOT use if you have an allergy to chlorhexidine /CHG or antibacterial soaps. If your skin becomes reddened or irritated, stop using the CHG and notify one of our RNs at [phone].  TAKE A SHOWER THE NIGHT BEFORE SURGERY AND THE DAY OF SURGERY    Please keep in mind the following:  DO NOT shave, including legs and underarms, 48 hours prior to surgery.   You may shave your face before/day of surgery.  Place clean sheets on your bed the night before surgery Use a clean washcloth (not used since being washed) for each shower. DO NOT sleep with pet's night before surgery.  CHG Shower Instructions:  Wash your face and private area with normal soap. If you choose to wash your hair, wash first with your normal shampoo.  After you use shampoo/soap, rinse your hair and  body thoroughly to remove shampoo/soap residue.  Turn the water OFF and apply half the bottle of CHG soap to a CLEAN washcloth.  Apply CHG soap ONLY FROM YOUR NECK DOWN TO YOUR TOES (washing for 3-5 minutes)  DO NOT use CHG soap on face, private areas, open wounds, or sores.  Pay special attention to the area where your surgery is being performed.  If you are having back surgery, having someone wash your back for you may be helpful. Wait 2 minutes after CHG soap is applied, then you may rinse off the CHG soap.  Pat dry with a clean towel  Put on clean pajamas    Additional instructions for the day of surgery: DO NOT APPLY any lotions, deodorants, cologne, or perfumes.   Do not wear jewelry or makeup Do not wear nail polish, gel polish, artificial nails, or any other type of covering on natural nails (fingers and toes) Do not bring valuables to the hospital. [facility] is not responsible for valuables/personal belongings. Put on clean/comfortable clothes.  Please brush your teeth.  Ask your nurse before applying any prescription medications to the skin.

## 2023-10-07 NOTE — Progress Notes (Signed)
 Noted

## 2023-10-07 NOTE — Progress Notes (Signed)
ERCP scheduled, pt instructed and medications reviewed.  Patient instructions mailed to home.  Patient to call with any questions or concerns.  

## 2023-10-10 ENCOUNTER — Encounter (HOSPITAL_COMMUNITY)
Admission: RE | Admit: 2023-10-10 | Discharge: 2023-10-10 | Disposition: A | Discharge status: Home / Self Care | Source: Ambulatory Visit | Attending: General Surgery | Admitting: General Surgery

## 2023-10-10 ENCOUNTER — Encounter (HOSPITAL_COMMUNITY): Payer: Self-pay

## 2023-10-10 ENCOUNTER — Other Ambulatory Visit (HOSPITAL_COMMUNITY): Payer: Self-pay

## 2023-10-10 ENCOUNTER — Other Ambulatory Visit: Payer: Self-pay

## 2023-10-10 DIAGNOSIS — Z01818 Encounter for other preprocedural examination: Secondary | ICD-10-CM | POA: Diagnosis present

## 2023-10-10 DIAGNOSIS — Z01810 Encounter for preprocedural cardiovascular examination: Secondary | ICD-10-CM | POA: Insufficient documentation

## 2023-10-10 HISTORY — DX: Type 2 diabetes mellitus without complications: E11.9

## 2023-10-10 MED ORDER — BLOOD PRESSURE MONITOR MISC
0 refills | Status: DC
  Filled 2023-10-10: qty 1, 90d supply, fill #0

## 2023-10-10 NOTE — Progress Notes (Signed)
 PCP - Felicita Horns - Follow up in 6 months. Cardiologist - Denies  PPM/ICD - Denies Device Orders - N/A Rep Notified - N/A  Chest x-ray - N/A EKG - 10-10-23 Stress Test - Denies ECHO - Denies Cardiac Cath - Denies  Sleep Study - Denies CPAP - N/A  Fasting Blood Sugar - Denies Checks Blood Sugar _____ times a day: N/A  Last dose of GLP1 agonist-  Denies GLP1 instructions: N/A  Blood Thinner Instructions:Denies Aspirin Instructions:Denies  ERAS Protcol - clears until 0430 PRE-SURGERY Ensure or G2- N/A  COVID TEST- N/A   Anesthesia review: Yes, HTN, pre-diabetic, new EKG  Patient denies shortness of breath, fever, cough and chest pain at PAT appointment. Patient denies any respiratory issues at this time.    All instructions explained to the patient, with a verbal understanding of the material. Patient agrees to go over the instructions while at home for a better understanding. Patient also instructed to self quarantine after being tested for COVID-19. The opportunity to ask questions was provided.

## 2023-10-24 ENCOUNTER — Encounter (INDEPENDENT_AMBULATORY_CARE_PROVIDER_SITE_OTHER): Payer: Self-pay

## 2023-10-24 ENCOUNTER — Encounter (HOSPITAL_COMMUNITY): Payer: Self-pay | Admitting: Gastroenterology

## 2023-10-25 ENCOUNTER — Other Ambulatory Visit: Payer: Self-pay

## 2023-10-25 ENCOUNTER — Telehealth: Payer: Self-pay

## 2023-10-25 ENCOUNTER — Encounter (HOSPITAL_COMMUNITY): Payer: Self-pay

## 2023-10-25 ENCOUNTER — Other Ambulatory Visit (HOSPITAL_COMMUNITY): Payer: Self-pay

## 2023-10-25 NOTE — Telephone Encounter (Signed)
 Procedure:ERCP Procedure date: 10/31/23 Procedure location: [facility] Arrival Time: 10:30 am Spoke with the patient Y/N: y Any prep concerns? n  Has the patient obtained the prep from the pharmacy ? n Do you have a care partner and transportation: y Any additional concerns? n

## 2023-10-26 ENCOUNTER — Other Ambulatory Visit: Payer: Self-pay

## 2023-10-31 ENCOUNTER — Ambulatory Visit (HOSPITAL_COMMUNITY)
Admission: RE | Admit: 2023-10-31 | Discharge: 2023-10-31 | Disposition: A | Discharge status: Home / Self Care | Attending: Gastroenterology | Admitting: Gastroenterology

## 2023-10-31 ENCOUNTER — Ambulatory Visit: Payer: Self-pay | Admitting: General Surgery

## 2023-10-31 ENCOUNTER — Other Ambulatory Visit: Payer: Self-pay

## 2023-10-31 ENCOUNTER — Ambulatory Visit (HOSPITAL_COMMUNITY)

## 2023-10-31 ENCOUNTER — Telehealth: Payer: Self-pay

## 2023-10-31 ENCOUNTER — Other Ambulatory Visit (HOSPITAL_COMMUNITY): Payer: Self-pay

## 2023-10-31 ENCOUNTER — Encounter (HOSPITAL_COMMUNITY): Payer: Self-pay | Admitting: Gastroenterology

## 2023-10-31 ENCOUNTER — Encounter (HOSPITAL_COMMUNITY)
Admission: RE | Disposition: A | Discharge status: Home / Self Care | Payer: Self-pay | Source: Home / Self Care | Attending: Gastroenterology

## 2023-10-31 ENCOUNTER — Ambulatory Visit (HOSPITAL_COMMUNITY): Admitting: Anesthesiology

## 2023-10-31 ENCOUNTER — Ambulatory Visit (HOSPITAL_BASED_OUTPATIENT_CLINIC_OR_DEPARTMENT_OTHER): Admitting: Anesthesiology

## 2023-10-31 DIAGNOSIS — E66812 Obesity, class 2: Secondary | ICD-10-CM

## 2023-10-31 DIAGNOSIS — K8081 Other cholelithiasis with obstruction: Secondary | ICD-10-CM

## 2023-10-31 DIAGNOSIS — R7303 Prediabetes: Secondary | ICD-10-CM | POA: Diagnosis not present

## 2023-10-31 DIAGNOSIS — R7989 Other specified abnormal findings of blood chemistry: Secondary | ICD-10-CM

## 2023-10-31 DIAGNOSIS — K219 Gastro-esophageal reflux disease without esophagitis: Secondary | ICD-10-CM | POA: Diagnosis not present

## 2023-10-31 DIAGNOSIS — I10 Essential (primary) hypertension: Secondary | ICD-10-CM | POA: Insufficient documentation

## 2023-10-31 DIAGNOSIS — Z6838 Body mass index (BMI) 38.0-38.9, adult: Secondary | ICD-10-CM

## 2023-10-31 DIAGNOSIS — K8050 Calculus of bile duct without cholangitis or cholecystitis without obstruction: Secondary | ICD-10-CM

## 2023-10-31 DIAGNOSIS — K8020 Calculus of gallbladder without cholecystitis without obstruction: Secondary | ICD-10-CM

## 2023-10-31 DIAGNOSIS — R748 Abnormal levels of other serum enzymes: Secondary | ICD-10-CM | POA: Insufficient documentation

## 2023-10-31 DIAGNOSIS — K8061 Calculus of gallbladder and bile duct with cholecystitis, unspecified, with obstruction: Secondary | ICD-10-CM

## 2023-10-31 DIAGNOSIS — R932 Abnormal findings on diagnostic imaging of liver and biliary tract: Secondary | ICD-10-CM | POA: Insufficient documentation

## 2023-10-31 DIAGNOSIS — K838 Other specified diseases of biliary tract: Secondary | ICD-10-CM | POA: Diagnosis not present

## 2023-10-31 DIAGNOSIS — Z9884 Bariatric surgery status: Secondary | ICD-10-CM | POA: Diagnosis not present

## 2023-10-31 DIAGNOSIS — Z4689 Encounter for fitting and adjustment of other specified devices: Secondary | ICD-10-CM

## 2023-10-31 HISTORY — PX: ERCP: SHX5425

## 2023-10-31 SURGERY — ERCP, WITH INTERVENTION IF INDICATED
Anesthesia: General

## 2023-10-31 MED ORDER — ONDANSETRON HCL 4 MG/2ML IJ SOLN
INTRAMUSCULAR | Status: DC | PRN
Start: 2023-10-31 — End: 2023-10-31
  Administered 2023-10-31: 4 mg via INTRAVENOUS

## 2023-10-31 MED ORDER — CIPROFLOXACIN IN D5W 400 MG/200ML IV SOLN
INTRAVENOUS | Status: DC | PRN
  Administered 2023-10-31: 400 mg via INTRAVENOUS

## 2023-10-31 MED ORDER — ROCURONIUM BROMIDE 10 MG/ML (PF) SYRINGE
PREFILLED_SYRINGE | INTRAVENOUS | Status: DC | PRN
  Administered 2023-10-31: 50 mg via INTRAVENOUS

## 2023-10-31 MED ORDER — MIDAZOLAM HCL 2 MG/2ML IJ SOLN
INTRAMUSCULAR | Status: AC
  Filled 2023-10-31: qty 2

## 2023-10-31 MED ORDER — SUGAMMADEX SODIUM 200 MG/2ML IV SOLN
INTRAVENOUS | Status: DC | PRN
  Administered 2023-10-31: 200 mg via INTRAVENOUS

## 2023-10-31 MED ORDER — PROPOFOL 10 MG/ML IV BOLUS
INTRAVENOUS | Status: DC | PRN
  Administered 2023-10-31: 180 mg via INTRAVENOUS

## 2023-10-31 MED ORDER — PROPOFOL 10 MG/ML IV BOLUS
INTRAVENOUS | Status: AC
  Filled 2023-10-31: qty 20

## 2023-10-31 MED ORDER — DICLOFENAC SUPPOSITORY 100 MG
RECTAL | Status: DC | PRN
  Administered 2023-10-31: 100 mg via RECTAL

## 2023-10-31 MED ORDER — MIDAZOLAM HCL 5 MG/5ML IJ SOLN
INTRAMUSCULAR | Status: DC | PRN
  Administered 2023-10-31: 2 mg via INTRAVENOUS

## 2023-10-31 MED ORDER — FENTANYL CITRATE (PF) 100 MCG/2ML IJ SOLN
INTRAMUSCULAR | Status: AC
  Filled 2023-10-31: qty 2

## 2023-10-31 MED ORDER — DICLOFENAC SUPPOSITORY 100 MG
RECTAL | Status: AC
  Filled 2023-10-31: qty 1

## 2023-10-31 MED ORDER — DEXAMETHASONE SODIUM PHOSPHATE 10 MG/ML IJ SOLN
INTRAMUSCULAR | Status: DC | PRN
  Administered 2023-10-31: 10 mg via INTRAVENOUS

## 2023-10-31 MED ORDER — CIPROFLOXACIN IN D5W 400 MG/200ML IV SOLN
INTRAVENOUS | Status: AC
  Filled 2023-10-31: qty 200

## 2023-10-31 MED ORDER — SODIUM CHLORIDE 0.9 % IV SOLN: INTRAVENOUS | Status: DC

## 2023-10-31 MED ORDER — PHENYLEPHRINE 80 MCG/ML (10ML) SYRINGE FOR IV PUSH (FOR BLOOD PRESSURE SUPPORT)
PREFILLED_SYRINGE | INTRAVENOUS | Status: DC | PRN
  Administered 2023-10-31: 160 ug via INTRAVENOUS

## 2023-10-31 MED ORDER — GLUCAGON HCL RDNA (DIAGNOSTIC) 1 MG IJ SOLR
INTRAMUSCULAR | Status: AC
  Filled 2023-10-31: qty 1

## 2023-10-31 MED ORDER — LIDOCAINE 2% (20 MG/ML) 5 ML SYRINGE
INTRAMUSCULAR | Status: DC | PRN
  Administered 2023-10-31: 80 mg via INTRAVENOUS

## 2023-10-31 MED ORDER — LACTATED RINGERS IV SOLN
INTRAVENOUS | Status: AC | PRN
  Administered 2023-10-31: 1000 mL via INTRAVENOUS

## 2023-10-31 MED ORDER — CIPROFLOXACIN HCL 500 MG PO TABS
500.0000 mg | ORAL_TABLET | Freq: Two times a day (BID) | ORAL | 0 refills | Status: AC
  Filled 2023-10-31: qty 6, 3d supply, fill #0

## 2023-10-31 MED ORDER — GLUCAGON HCL RDNA (DIAGNOSTIC) 1 MG IJ SOLR
INTRAMUSCULAR | Status: DC | PRN
  Administered 2023-10-31 (×4): .25 mg via INTRAVENOUS

## 2023-10-31 MED ORDER — FENTANYL CITRATE (PF) 100 MCG/2ML IJ SOLN
INTRAMUSCULAR | Status: DC | PRN
  Administered 2023-10-31: 100 ug via INTRAVENOUS

## 2023-10-31 MED ORDER — SODIUM CHLORIDE 0.9 % IV SOLN
INTRAVENOUS | Status: DC | PRN
  Administered 2023-10-31: 60 mL

## 2023-10-31 NOTE — Transfer of Care (Signed)
 Immediate Anesthesia Transfer of Care Note  Patient: Michelle Wilson  Procedure(s) Performed: ERCP, WITH INTERVENTION IF INDICATED  Patient Location: PACU  Anesthesia Type:General  Level of Consciousness: awake, alert , and oriented  Airway & Oxygen Therapy: Patient Spontanous Breathing and Patient connected to face mask oxygen  Post-op Assessment: Report given to RN and Post -op Vital signs reviewed and stable  Post vital signs: Reviewed and stable  Last Vitals:  Vitals Value Taken Time  BP    Temp    Pulse 86 10/31/23 13:59  Resp 9 10/31/23 13:59  SpO2 100 % 10/31/23 13:59  Vitals shown include unfiled device data.  Last Pain:  Vitals:   10/31/23 1058  TempSrc: Temporal  PainSc: 0-No pain         Complications: No notable events documented.

## 2023-10-31 NOTE — Discharge Instructions (Signed)

## 2023-10-31 NOTE — Anesthesia Preprocedure Evaluation (Addendum)
 Anesthesia Evaluation  Patient identified by MRN, date of birth, ID band Patient awake    Reviewed: Allergy & Precautions, NPO status , Patient's Chart, lab work & pertinent test results  History of Anesthesia Complications Negative for: history of anesthetic complications  Airway Mallampati: II  TM Distance: >3 FB Neck ROM: Full    Dental  (+) Dental Advisory Given, Teeth Intact   Pulmonary neg pulmonary ROS   Pulmonary exam normal        Cardiovascular hypertension, Pt. on medications Normal cardiovascular exam     Neuro/Psych  PSYCHIATRIC DISORDERS Anxiety     negative neurological ROS     GI/Hepatic Neg liver ROS,GERD  Controlled,, Hx gastric sleeve    Endo/Other   Obesity Pre-DM   Renal/GU negative Renal ROS     Musculoskeletal negative musculoskeletal ROS (+)    Abdominal  (+) + obese  Peds  Hematology negative hematology ROS (+)   Anesthesia Other Findings   Reproductive/Obstetrics  Hx b/l salpingectomy                               Anesthesia Physical Anesthesia Plan  ASA: 3  Anesthesia Plan: General   Post-op Pain Management: Minimal or no pain anticipated   Induction: Intravenous  PONV Risk Score and Plan: 3 and Treatment may vary due to age or medical condition, Ondansetron , Dexamethasone  and Midazolam   Airway Management Planned: Oral ETT  Additional Equipment: None  Intra-op Plan:   Post-operative Plan: Extubation in OR  Informed Consent: I have reviewed the patients History and Physical, chart, labs and discussed the procedure including the risks, benefits and alternatives for the proposed anesthesia with the patient or authorized representative who has indicated his/her understanding and acceptance.     Dental advisory given  Plan Discussed with: CRNA and Anesthesiologist  Anesthesia Plan Comments:          Anesthesia Quick  Evaluation

## 2023-10-31 NOTE — Telephone Encounter (Signed)
-----   Message from [facility] sent at 10/31/2023  2:14 PM EDT ----- Regarding: Followup Michelle Wilson, This patient needs a repeat ERCP in 2 to 3 months for stent removal, choledocholithiasis. Thanks. GM

## 2023-10-31 NOTE — Anesthesia Procedure Notes (Signed)
 Procedure Name: Intubation Date/Time: 10/31/2023 12:35 PM  Performed by: Ambrie Carte D, CRNAPre-anesthesia Checklist: Patient identified, Emergency Drugs available, Suction available and Patient being monitored Patient Re-evaluated:Patient Re-evaluated prior to induction Oxygen Delivery Method: Circle system utilized Preoxygenation: Pre-oxygenation with 100% oxygen Induction Type: IV induction Ventilation: Mask ventilation without difficulty Laryngoscope Size: Mac and 4 Grade View: Grade I Tube type: Oral Tube size: 7.5 mm Number of attempts: 1 Airway Equipment and Method: Stylet and Oral airway Placement Confirmation: ETT inserted through vocal cords under direct vision, positive ETCO2 and breath sounds checked- equal and bilateral Secured at: 21 cm Tube secured with: Tape Dental Injury: Teeth and Oropharynx as per pre-operative assessment

## 2023-10-31 NOTE — Progress Notes (Signed)
 Surgery orders requested via Epic inbox.

## 2023-10-31 NOTE — Op Note (Signed)
 [facility] Patient Name: Michelle Wilson Procedure Date: 10/31/2023 MRN: [id number] Attending MD: [name] , MD, [id number] Date of Birth: [birth year] CSN: [id number] Age: 48 Admit Type: Ambulatory Procedure:                ERCP Indications:              Bile duct stone(s), Elevated liver enzymes Providers:                [name], MD, Hoy Penner, RN, Randall Lines, RN, Fairy Marina, Technician Referring MD:              Medicines:                General Anesthesia, Cipro  400 mg IV, Diclofenac  100                            mg rectal, Glucagon  1 mg IV Complications:            No immediate complications. Estimated Blood Loss:     Estimated blood loss was minimal. Procedure:                Pre-Anesthesia Assessment:                           - Prior to the procedure, a History and Physical                            was performed, and patient medications and                            allergies were reviewed. The patient's tolerance of                            previous anesthesia was also reviewed. The risks                            and benefits of the procedure and the sedation                            options and risks were discussed with the patient.                            All questions were answered, and informed consent                            was obtained. Prior Anticoagulants: The patient has                            taken no anticoagulant or antiplatelet agents. ASA                            Grade Assessment: II - A patient with mild systemic  disease. After reviewing the risks and benefits,                            the patient was deemed in satisfactory condition to                            undergo the procedure.                           After obtaining informed consent, the scope was                            passed under direct vision. Throughout the                             procedure, the patient's blood pressure, pulse, and                            oxygen saturations were monitored continuously. The                            [employer] D single use                            duodenoscope was introduced through the mouth, and                            used to inject contrast into and used to inject                            contrast into the bile duct. The ERCP was                            accomplished without difficulty. The patient                            tolerated the procedure. Scope In: Scope Out: Findings:      The scout film was normal.      The esophagus was successfully intubated under direct vision without       detailed examination of the pharynx, larynx, and associated structures,       and upper GI tract. The major papilla was congested.      A 0.035 inch x 260 cm straight Hydra Jagwire was passed into the biliary       tree after initially partially cannulating the bile duct with a bowed       sphincterotome. The Hydratome sphincterotome was then passed over the       guidewire and the bile duct was then deeply cannulated. Contrast was       injected. I personally interpreted the bile duct images. Ductal flow of       contrast was adequate. Image quality was adequate. Contrast extended to       the hepatic ducts. Opacification of the entire biliary tree except for       the gallbladder was successful. The main bile duct contained filling  defects thought to be many stones. The main bile duct was moderately       dilated. The largest diameter was 14 mm. An 8 mm biliary sphincterotomy       was made with a monofilament Hydratome sphincterotome using ERBE       electrocautery. There was no post-sphincterotomy bleeding. A       sphincteroplasty was performed with dilation of the common bile duct       with an 12-02-08 mm balloon (to a maximum balloon size of 10 mm) dilator       was successful  for total of 3 minutes. The biliary tree was swept with a       retrieval balloon. Sludge was swept from the duct. Greater than 20       stones were removed. It was not felt that further stones remained. An       occlusion cholangiogram was performed that showed no further significant       biliary pathology. However flow of the bile was quite slow even after       multiple minutes of monitoring the area and sweeping the region again.       Decision made for biliary stenting. Two plastic biliary stents (10       [place] by 7 cm and 10 [place] by 5 cm) with a single external flap and a       single internal flap were placed into the common bile duct. The stents       were in good position.      A formal pancreatogram was not performed.      The duodenoscope was withdrawn from the patient. Impression:               - The major papilla appeared congested.                           - Multiple filling defects consistent with stones                            were seen on the cholangiogram.                           - The entire main bile duct was moderately dilated.                           - Choledocholithiasis was found. Complete removal                            was accomplished by biliary sphincterotomy and                            balloon sphincteroplasty and balloon sweeping.                           - Biliary flow was very slow, 2 plastic biliary                            stents were placed into the common bile duct to  ensure biliary drainage                           . Moderate Sedation:      Not Applicable - Patient had care per Anesthesia. Recommendation:           - The patient will be observed post-procedure,                            until all discharge criteria are met.                           - Discharge patient to home.                           - Patient has a contact number available for                            emergencies. The signs and  symptoms of potential                            delayed complications were discussed with the                            patient. Return to normal activities tomorrow.                            Written discharge instructions were provided to the                            patient.                           - Low fat diet.                           - Observe patient's clinical course.                           - Watch for pancreatitis, bleeding, perforation,                            and cholangitis.                           - Ciprofloxacin  500 mg twice daily for 3 days to                            decrease risk of post interventional infection.                           - As long as patient is doing well, her planned                            outpatient cholecystectomy next week is reasonable.  Will discuss and forward results to Dr. Tanda.                           - Repeat ERCP in 2 months to remove stent and do                            final biliary cleanout.                           - The findings and recommendations were discussed                            with the patient.                           - The findings and recommendations were discussed                            with the patient's family. Procedure Code(s):        --- Professional ---                           [phone], Endoscopic retrograde                            cholangiopancreatography (ERCP); with placement of                            endoscopic stent into biliary or pancreatic duct,                            including pre- and post-dilation and guide wire                            passage, when performed, including sphincterotomy,                            when performed, each stent                           43274, 59, Endoscopic retrograde                            cholangiopancreatography (ERCP); with placement of                            endoscopic stent into biliary  or pancreatic duct,                            including pre- and post-dilation and guide wire                            passage, when performed, including sphincterotomy,                            when performed, each stent  56735, Endoscopic retrograde                            cholangiopancreatography (ERCP); with removal of                            calculi/debris from biliary/pancreatic duct(s)                           74328, 26, Endoscopic catheterization of the                            biliary ductal system, radiological supervision and                            interpretation Diagnosis Code(s):        --- Professional ---                           K83.8, Other specified diseases of biliary tract                           K80.50, Calculus of bile duct without cholangitis                            or cholecystitis without obstruction                           R74.8, Abnormal levels of other serum enzymes                           R93.2, Abnormal findings on diagnostic imaging of                            liver and biliary tract CPT copyright 2022 American Medical Association. All rights reserved. The codes documented in this report are preliminary and upon coder review may  be revised to meet current compliance requirements. [name], MD 10/31/2023 2:04:17 PM Number of Addenda: 0

## 2023-10-31 NOTE — H&P (Signed)
 GASTROENTEROLOGY PROCEDURE H&P NOTE   Primary Care Physician: Corwin Antu, FNP  HPI: Michelle Wilson is a 48 y.o. female  who presents for ERCP choledocholithiasis (prior to cholecystectomy).  Past Medical History:  Diagnosis Date   Anemia    Anxiety    GERD (gastroesophageal reflux disease)    pregnancy   Hypertension    Aldomet  250 mg Q day   IBS (irritable bowel syndrome)    no issues currently   Infection    Yeast inf; no frequent   Past Surgical History:  Procedure Laterality Date   BILATERAL SALPINGECTOMY  05/31/2012   Procedure: BILATERAL SALPINGECTOMY;  Surgeon: Shanda SHAUNNA Muscat, MD;  Location: [facility];  Service: Obstetrics;  Laterality: Bilateral;   BREAST REDUCTION SURGERY  1997   CESAREAN SECTION  2011   CESAREAN SECTION  05/31/2012   Procedure: CESAREAN SECTION;  Surgeon: Shanda SHAUNNA Muscat, MD;  Location: [facility];  Service: Obstetrics;  Laterality: N/A;   LAPAROSCOPIC GASTRIC SLEEVE RESECTION N/A 04/12/2016   Procedure: LAPAROSCOPIC GASTRIC SLEEVE RESECTION, UPPER ENDO;  Surgeon: Donnice Lunger, MD;  Location: [facility];  Service: General;  Laterality: N/A;   REDUCTION MAMMAPLASTY Bilateral    No current facility-administered medications for this encounter.   No current facility-administered medications for this encounter. Allergies  Allergen Reactions   Ambien  [Zolpidem ] Other (See Comments)    Sleep walking    Family History  Problem Relation Age of Onset   Hypertension Mother    Heart disease Mother 44       Heart attack   Hyperlipidemia Mother    Hypertension Father    Pancreatitis Father    Hypertension Maternal Grandmother    Hypertension Maternal Grandfather    Stroke Maternal Grandfather    Hypertension Paternal Grandmother    Cervical cancer Paternal Grandmother    Hypertension Paternal Grandfather    Hyperthyroidism Maternal Aunt        x 3   Schizophrenia Maternal Aunt    Lung cancer Maternal Uncle    Cancer - Lung Maternal Uncle     Social History   Socioeconomic History   Marital status: Married    Spouse name: Tanairy Payeur   Number of children: 2   Years of education: Not on file   Highest education level: Associate degree: academic program  Occupational History   Occupation: Surveyor, minerals: [employer]    Comment: Writer  Tobacco Use   Smoking status: Never    Passive exposure: Past   Smokeless tobacco: Never  Vaping Use   Vaping status: Never Used  Substance and Sexual Activity   Alcohol use: No   Drug use: No   Sexual activity: Yes    Partners: Male    Birth control/protection: Surgical    Comment: Tubes Removed   Other Topics Concern   Not on file  Social History Narrative   63 and 33 year old sons (2024)   Social Drivers of Corporate investment banker Strain: Low Risk  (09/20/2023)   Overall Financial Resource Strain (CARDIA)    Difficulty of Paying Living Expenses: Not hard at all  Food Insecurity: No Food Insecurity (09/20/2023)   Hunger Vital Sign    Worried About Running Out of Food in the Last Year: Never true    Ran Out of Food in the Last Year: Never true  Transportation Needs: No Transportation Needs (09/20/2023)   PRAPARE - Transportation    Lack of Transportation (  Medical): No    Lack of Transportation (Non-Medical): No  Physical Activity: Sufficiently Active (10/17/2023)   Received from [facility]   Exercise Vital Sign    On average, how many days per week do you engage in moderate to strenuous exercise (like a brisk walk)?: 5 days    On average, how many minutes do you engage in exercise at this level?: 30 min  Stress: Stress Concern Present (09/20/2023)   [employer] of Occupational Health - Occupational Stress Questionnaire    Feeling of Stress : To some extent  Social Connections: Moderately Integrated (09/20/2023)   Social Connection and Isolation Panel    Frequency of Communication with Friends and Family: Once a  week    Frequency of Social Gatherings with Friends and Family: Once a week    Attends Religious Services: 1 to 4 times per year    Active Member of [employer] or Organizations: Yes    Attends Banker Meetings: 1 to 4 times per year    Marital Status: Married  Catering manager Violence: Not on file    Physical Exam: Today's Vitals   10/24/23 1103  Weight: 104 kg   Body mass index is 39.36 kg/m. GEN: NAD EYE: Sclerae anicteric ENT: MMM CV: Non-tachycardic GI: Soft, NT/ND NEURO:  Alert & Oriented x 3  Lab Results: No results for input(s): WBC, HGB, HCT, PLT in the last 72 hours. BMET No results for input(s): NA, K, CL, CO2, GLUCOSE, BUN, CREATININE, CALCIUM  in the last 72 hours. LFT No results for input(s): PROT, ALBUMIN, AST, ALT, ALKPHOS, BILITOT, BILIDIR, IBILI in the last 72 hours. PT/INR No results for input(s): LABPROT, INR in the last 72 hours.   Impression / Plan: This is a 48 y.o.female who presents for ERCP choledocholithiasis (prior to cholecystectomy).  The risks of an ERCP were discussed at length, including but not limited to the risk of perforation, bleeding, abdominal pain, post-ERCP pancreatitis (while usually mild can be severe and even life threatening).  The risks and benefits of endoscopic evaluation/treatment were discussed with the patient and/or family; these include but are not limited to the risk of perforation, infection, bleeding, missed lesions, lack of diagnosis, severe illness requiring hospitalization, as well as anesthesia and sedation related illnesses.  The patient's history has been reviewed, patient examined, no change in status, and deemed stable for procedure.  The patient and/or family is agreeable to proceed.    [name], MD  Gastroenterology Advanced Endoscopy Office # [phone]

## 2023-10-31 NOTE — Telephone Encounter (Signed)
 ERCP has been set up for 01/18/24 at 1030 am at [facility] with GM

## 2023-11-01 ENCOUNTER — Encounter (HOSPITAL_COMMUNITY): Payer: Self-pay | Admitting: Gastroenterology

## 2023-11-01 NOTE — Telephone Encounter (Signed)
ERCP scheduled, pt instructed and medications reviewed.  Patient instructions mailed to home.  Patient to call with any questions or concerns.  

## 2023-11-01 NOTE — Anesthesia Postprocedure Evaluation (Signed)
 Anesthesia Post Note  Patient: Michelle Wilson  Procedure(s) Performed: ERCP, WITH INTERVENTION IF INDICATED     Patient location during evaluation: PACU Anesthesia Type: General Level of consciousness: awake and alert Pain management: pain level controlled Vital Signs Assessment: post-procedure vital signs reviewed and stable Respiratory status: spontaneous breathing, nonlabored ventilation and respiratory function stable Cardiovascular status: stable and blood pressure returned to baseline Anesthetic complications: no   No notable events documented.  Last Vitals:  Vitals:   10/31/23 1420 10/31/23 1429  BP: (!) 171/93 (!) 165/86  Pulse: 76 75  Resp: 13 19  Temp:    SpO2: 99% 98%    Last Pain:  Vitals:   10/31/23 1429  TempSrc:   PainSc: 0-No pain                 Debby FORBES Like

## 2023-11-02 ENCOUNTER — Ambulatory Visit: Payer: Self-pay | Admitting: Family

## 2023-11-03 ENCOUNTER — Other Ambulatory Visit (HOSPITAL_COMMUNITY): Payer: Self-pay

## 2023-11-05 NOTE — Patient Instructions (Signed)
 SURGICAL WAITING ROOM VISITATION Patients having surgery or a procedure may have no more than 2 support people in the waiting area - these visitors may rotate in the visitor waiting room.   If the patient needs to stay at the hospital during part of their recovery, the visitor guidelines for inpatient rooms apply.  PRE-OP VISITATION  Pre-op nurse will coordinate an appropriate time for 1 support person to accompany the patient in pre-op.  This support person may not rotate.  This visitor will be contacted when the time is appropriate for the visitor to come back in the pre-op area.  Please refer to the [facility] website for the visitor guidelines for Inpatients (after your surgery is over and you are in a regular room).  You are not required to quarantine at this time prior to your surgery. However, you must do this: Hand Hygiene often Do NOT share personal items Notify your provider if you are in close contact with someone who has COVID or you develop fever 100.4 or greater, new onset of sneezing, cough, sore throat, shortness of breath or body aches.  If you test positive for Covid or have been in contact with anyone that has tested positive in the last 10 days please notify you surgeon.    Your procedure is scheduled on:  FRIDAY  November 11, 2023  Report to [facility] Main Entrance: [address] entrance where the [employer] is available.   Report to admitting at: 09:15    AM  Call this number if you have any questions or problems the morning of surgery [phone]  FOLLOW ANY ADDITIONAL PRE OP INSTRUCTIONS YOU RECEIVED FROM YOUR SURGEON'S OFFICE!!!  Do not eat food after Midnight the night prior to your surgery/procedure.  After Midnight you may have the following liquids until  08:30 AM DAY OF SURGERY  Clear Liquid Diet Water Black Coffee (sugar ok, NO MILK/CREAM OR CREAMERS)  Tea (sugar ok, NO MILK/CREAM OR CREAMERS) regular and decaf                             Plain  Jell-O  with no fruit (NO RED)                                           Fruit ices (not with fruit pulp, NO RED)                                     Popsicles (NO RED)                                                                  Juice: NO CITRUS JUICES: only apple, WHITE grape, WHITE cranberry Sports drinks like Gatorade or Powerade (NO RED)                 Oral Hygiene is also important to reduce your risk of infection.        Remember - BRUSH YOUR TEETH THE MORNING OF SURGERY WITH YOUR REGULAR TOOTHPASTE  Do NOT  smoke after Midnight the night before surgery.  STOP TAKING all Vitamins, Herbs and supplements 1 week before your surgery.   Take ONLY these medicines the morning of surgery with A SIP OF WATER: escitalopram  (Lexapro ), Alprazolam  if needed.    DO NOT TAKE LOSARTAN  the morning of your surgery.   You may not have any metal on your body including hair pins, jewelry, and body piercing  Do not wear make-up, lotions, powders, perfumes  or deodorant  Do not wear nail polish including gel and S&S, artificial / acrylic nails, or any other type of covering on natural nails including finger and toenails. If you have artificial nails, gel coating, etc., that needs to be removed by a nail salon, Please have this removed prior to surgery. Not doing so may mean that your surgery could be cancelled or delayed if the Surgeon or anesthesia staff feels like they are unable to monitor you safely.   Do not shave 48 hours prior to surgery to avoid nicks in your skin which may contribute to postoperative infections.    Contacts, Hearing Aids, dentures or bridgework may not be worn into surgery. DENTURES WILL BE REMOVED PRIOR TO SURGERY PLEASE DO NOT APPLY Poly grip OR ADHESIVES!!!  Patients discharged on the day of surgery will not be allowed to drive home.  Someone NEEDS to stay with you for the first 24 hours after anesthesia.  Do not bring your home medications to the hospital. The  Pharmacy will dispense medications listed on your medication list to you during your admission in the Hospital.   Please read over the following fact sheets you were given: IF YOU HAVE QUESTIONS ABOUT YOUR PRE-OP INSTRUCTIONS, PLEASE CALL [phone]   [facility] - Preparing for Surgery Before surgery, you can play an important role.  Because skin is not sterile, your skin needs to be as free of germs as possible.  You can reduce the number of germs on your skin by washing with CHG (chlorahexidine gluconate) soap before surgery.  CHG is an antiseptic cleaner which kills germs and bonds with the skin to continue killing germs even after washing. Please DO NOT use if you have an allergy to CHG or antibacterial soaps.  If your skin becomes reddened/irritated stop using the CHG and inform your nurse when you arrive at Short Stay. Do not shave (including legs and underarms) for at least 48 hours prior to the first CHG shower.  You may shave your face/neck.  Please follow these instructions carefully:  1.  Shower with CHG Soap the night before surgery and the  morning of surgery.  2.  If you choose to wash your hair, wash your hair first as usual with your normal  shampoo.  3.  After you shampoo, rinse your hair and body thoroughly to remove the shampoo.                             4.  Use CHG as you would any other liquid soap.  You can apply chg directly to the skin and wash.  Gently with a scrungie or clean washcloth.  5.  Apply the CHG Soap to your body ONLY FROM THE NECK DOWN.   Do not use on face/ open                           Wound or open sores. Avoid contact with eyes,  ears mouth and genitals (private parts).                       Wash face,  Genitals (private parts) with your normal soap.             6.  Wash thoroughly, paying special attention to the area where your  surgery  will be performed.  7.  Thoroughly rinse your body with warm water from the neck down.  8.  DO NOT shower/wash  with your normal soap after using and rinsing off the CHG Soap.            9.  Pat yourself dry with a clean towel.            10.  Wear clean pajamas.            11.  Place clean sheets on your bed the night of your first shower and do not  sleep with pets.  ON THE DAY OF SURGERY : Do not apply any lotions/deodorants the morning of surgery.  Please wear clean clothes to the hospital/surgery center.     FAILURE TO FOLLOW THESE INSTRUCTIONS MAY RESULT IN THE CANCELLATION OF YOUR SURGERY  PATIENT SIGNATURE_________________________________  NURSE SIGNATURE__________________________________  ________________________________________________________________________

## 2023-11-05 NOTE — Progress Notes (Signed)
 COVID Vaccine received:  []  No [x]  Yes Date of any COVID positive Test in last 90 days:  PCP - Ginger Patrick, FNP  Cardiologist -   Chest x-ray - 02-16-2016  2v  Epic EKG -  10-10-2023  Epic Stress Test -  ECHO -  Cardiac Cath -  CT Coronary Calcium  score:   Bowel Prep - [x]  No  []   Yes ______  Pacemaker / ICD device [x]  No []  Yes   Spinal Cord Stimulator:[x]  No []  Yes       History of Sleep Apnea? [x]  No []  Yes   CPAP used?- [x]  No []  Yes    Does the patient monitor blood sugar?   []  N/A   [x]  No []  Yes  Patient has: []  NO Hx DM   [x]  Pre-DM   []  DM1  []   DM2 Last A1c was: 5.6  on 09-23-2023    No Meds      Blood Thinner / Instructions: none Aspirin Instructions:  none  ERAS Protocol Ordered: []  No  [x]  Yes PRE-SURGERY []  ENSURE  []  G2   [x]  No Drink Ordered Patient is to be NPO after: 0830  Dental hx: []  Dentures:  []  N/A      []  Bridge or Partial:                   []  Loose or Damaged teeth:   Activity level: Able to walk up 2 flights of stairs without becoming significantly short of breath or having chest pain?  []  No   []    Yes   Anesthesia review: HTN, anemia, GERD, Pre-DM, anxiety, s/p gastric sleeve bypass 2017  Patient denies any S&S of respiratory illness or Covid - no shortness of breath, fever, cough or chest pain at PAT appointment.  Patient verbalized understanding and agreement to the Pre-Surgical Instructions that were given to them at this PAT appointment. Patient was also educated of the need to review these PAT instructions again prior to her surgery.I reviewed the appropriate phone numbers to call if they have any and questions or concerns.

## 2023-11-07 ENCOUNTER — Other Ambulatory Visit: Payer: Self-pay

## 2023-11-07 ENCOUNTER — Encounter (HOSPITAL_COMMUNITY): Payer: Self-pay | Admitting: *Deleted

## 2023-11-07 ENCOUNTER — Encounter (HOSPITAL_COMMUNITY)
Admission: RE | Admit: 2023-11-07 | Discharge: 2023-11-07 | Disposition: A | Discharge status: Home / Self Care | Source: Ambulatory Visit | Attending: General Surgery | Admitting: General Surgery

## 2023-11-07 DIAGNOSIS — Z01812 Encounter for preprocedural laboratory examination: Secondary | ICD-10-CM | POA: Insufficient documentation

## 2023-11-07 DIAGNOSIS — R7303 Prediabetes: Secondary | ICD-10-CM | POA: Insufficient documentation

## 2023-11-07 DIAGNOSIS — R7989 Other specified abnormal findings of blood chemistry: Secondary | ICD-10-CM | POA: Insufficient documentation

## 2023-11-07 DIAGNOSIS — I10 Essential (primary) hypertension: Secondary | ICD-10-CM | POA: Diagnosis not present

## 2023-11-07 DIAGNOSIS — Z01818 Encounter for other preprocedural examination: Secondary | ICD-10-CM

## 2023-11-07 DIAGNOSIS — K8050 Calculus of bile duct without cholangitis or cholecystitis without obstruction: Secondary | ICD-10-CM | POA: Diagnosis not present

## 2023-11-07 HISTORY — DX: Prediabetes: R73.03

## 2023-11-07 LAB — COMPREHENSIVE METABOLIC PANEL WITH GFR
ALT: 19 U/L (ref 0–44)
AST: 21 U/L (ref 15–41)
Albumin: 3.4 g/dL — ABNORMAL LOW (ref 3.5–5.0)
Alkaline Phosphatase: 151 U/L — ABNORMAL HIGH (ref 38–126)
Anion gap: 13 (ref 5–15)
BUN: 10 mg/dL (ref 6–20)
CO2: 25 mmol/L (ref 22–32)
Calcium: 9 mg/dL (ref 8.9–10.3)
Chloride: 101 mmol/L (ref 98–111)
Creatinine, Ser: 0.84 mg/dL (ref 0.44–1.00)
GFR, Estimated: >60 mL/min (ref >60)
Glucose, Bld: 93 mg/dL (ref 70–99)
Potassium: 3.7 mmol/L (ref 3.5–5.1)
Sodium: 139 mmol/L (ref 135–145)
Total Bilirubin: 0.6 mg/dL (ref 0.0–1.2)
Total Protein: 7.4 g/dL (ref 6.5–8.1)

## 2023-11-07 LAB — CBC
HCT: 33.9 % — ABNORMAL LOW (ref 36.0–46.0)
Hemoglobin: 10.5 g/dL — ABNORMAL LOW (ref 12.0–15.0)
MCH: 26.5 pg (ref 26.0–34.0)
MCHC: 31 g/dL (ref 30.0–36.0)
MCV: 85.6 fL (ref 80.0–100.0)
Platelets: 283 K/uL (ref 150–400)
RBC: 3.96 MIL/uL (ref 3.87–5.11)
RDW: 14 % (ref 11.5–15.5)
WBC: 6.1 K/uL (ref 4.0–10.5)
nRBC: 0 % (ref 0.0–0.2)

## 2023-11-07 NOTE — Progress Notes (Signed)
 COVID Vaccine received:  []  No [x]  Yes Date of any COVID positive Test in last 27 days:None   PCP - Ginger Patrick, FNP  Cardiologist - none   Chest x-ray - 02-16-2016  2v  Epic EKG -  10-10-2023  Epic Stress Test -  ECHO -  Cardiac Cath -  CT Coronary Calcium  score:    Bowel Prep - [x]  No  []   Yes ______   Pacemaker / ICD device [x]  No []  Yes   Spinal Cord Stimulator:[x]  No []  Yes       History of Sleep Apnea? [x]  No []  Yes   CPAP used?- [x]  No []  Yes     Does the patient monitor blood sugar?   []  N/A   [x]  No []  Yes  Patient has: []  NO Hx DM   [x]  Pre-DM   []  DM1  []   DM2 Last A1c was: 5.6  on 09-23-2023    No Meds       Blood Thinner / Instructions: none Aspirin Instructions:  none   ERAS Protocol Ordered: []  No  [x]  Yes PRE-SURGERY []  ENSURE  []  G2   [x]  No Drink Ordered Patient is to be NPO after: 0830   Dental hx: []  Dentures:  [x]  N/A      []  Bridge or Partial:                   []  Loose or Damaged teeth:    Activity level: Able to walk up 2 flights of stairs without becoming significantly short of breath or having chest pain?  []  No   [x]    Yes    Anesthesia review: HTN, anemia, GERD, Pre-DM, anxiety, s/p gastric sleeve bypass 2017   Patient denies any S&S of respiratory illness or Covid - no shortness of breath, fever, cough or chest pain at PAT appointment.   Patient verbalized understanding and agreement to the Pre-Surgical Instructions that were given to them at this PAT appointment. Patient was also educated of the need to review these PAT instructions again prior to her surgery.I reviewed the appropriate phone numbers to call if they have any and questions or concerns.

## 2023-11-07 NOTE — Patient Instructions (Signed)
 SURGICAL WAITING ROOM VISITATION Patients having surgery or a procedure may have no more than 2 support people in the waiting area - these visitors may rotate in the visitor waiting room.   If the patient needs to stay at the hospital during part of their recovery, the visitor guidelines for inpatient rooms apply.   PRE-OP VISITATION  Pre-op nurse will coordinate an appropriate time for 1 support person to accompany the patient in pre-op.  This support person may not rotate.  This visitor will be contacted when the time is appropriate for the visitor to come back in the pre-op area.   Please refer to the [facility] website for the visitor guidelines for Inpatients (after your surgery is over and you are in a regular room).   You are not required to quarantine at this time prior to your surgery. However, you must do this: Hand Hygiene often Do NOT share personal items Notify your provider if you are in close contact with someone who has COVID or you develop fever 100.4 or greater, new onset of sneezing, cough, sore throat, shortness of breath or body aches.  If you test positive for Covid or have been in contact with anyone that has tested positive in the last 10 days please notify you surgeon.     Your procedure is scheduled on:  FRIDAY  November 11, 2023   Report to [facility] Main Entrance: [address] entrance where the [employer] is available.    Report to admitting at: 09:15    AM   Call this number if you have any questions or problems the morning of surgery [phone]   FOLLOW ANY ADDITIONAL PRE OP INSTRUCTIONS YOU RECEIVED FROM YOUR SURGEON'S OFFICE!!!   Do not eat food after Midnight the night prior to your surgery/procedure.   After Midnight you may have the following liquids until  08:30 AM DAY OF SURGERY   Clear Liquid Diet Water Black Coffee (sugar ok, NO MILK/CREAM OR CREAMERS)  Tea (sugar ok, NO MILK/CREAM OR CREAMERS) regular and decaf                              Plain Jell-O  with no fruit (NO RED)                                           Fruit ices (not with fruit pulp, NO RED)                                     Popsicles (NO RED)                                                                  Juice: NO CITRUS JUICES: only apple, WHITE grape, WHITE cranberry Sports drinks like Gatorade or Powerade (NO RED)                  Oral Hygiene is also important to reduce your risk of infection.        Remember - BRUSH YOUR  TEETH THE MORNING OF SURGERY WITH YOUR REGULAR TOOTHPASTE   Do NOT smoke after Midnight the night before surgery.   STOP TAKING all Vitamins, Herbs and supplements 1 week before your surgery.    Take ONLY these medicines the morning of surgery with A SIP OF WATER: escitalopram  (Lexapro ), Alprazolam  if needed.     DO NOT TAKE LOSARTAN  the morning of your surgery.     You may not have any metal on your body including hair pins, jewelry, and body piercing   Do not wear make-up, lotions, powders, perfumes  or deodorant   Do not wear nail polish including gel and S&S, artificial / acrylic nails, or any other type of covering on natural nails including finger and toenails. If you have artificial nails, gel coating, etc., that needs to be removed by a nail salon, Please have this removed prior to surgery. Not doing so may mean that your surgery could be cancelled or delayed if the Surgeon or anesthesia staff feels like they are unable to monitor you safely.    Do not shave 48 hours prior to surgery to avoid nicks in your skin which may contribute to postoperative infections.      Contacts, Hearing Aids, dentures or bridgework may not be worn into surgery. DENTURES WILL BE REMOVED PRIOR TO SURGERY PLEASE DO NOT APPLY Poly grip OR ADHESIVES!!!   Patients discharged on the day of surgery will not be allowed to drive home.  Someone NEEDS to stay with you for the first 24 hours after anesthesia.   Do not bring your home medications  to the hospital. The Pharmacy will dispense medications listed on your medication list to you during your admission in the Hospital.     Please read over the following fact sheets you were given: IF YOU HAVE QUESTIONS ABOUT YOUR PRE-OP INSTRUCTIONS, PLEASE CALL [phone]     [facility] - Preparing for Surgery Before surgery, you can play an important role.  Because skin is not sterile, your skin needs to be as free of germs as possible.  You can reduce the number of germs on your skin by washing with CHG (chlorahexidine gluconate) soap before surgery.  CHG is an antiseptic cleaner which kills germs and bonds with the skin to continue killing germs even after washing. Please DO NOT use if you have an allergy to CHG or antibacterial soaps.  If your skin becomes reddened/irritated stop using the CHG and inform your nurse when you arrive at Short Stay. Do not shave (including legs and underarms) for at least 48 hours prior to the first CHG shower.  You may shave your face/neck.   Please follow these instructions carefully:             1.  Shower with CHG Soap the night before surgery and the  morning of surgery.             2.  If you choose to wash your hair, wash your hair first as usual with your normal  shampoo.             3.  After you shampoo, rinse your hair and body thoroughly to remove the shampoo.                                              4.  Use CHG as you would  any other liquid soap.  You can apply chg directly to the skin and wash.  Gently with a scrungie or clean washcloth.             5.  Apply the CHG Soap to your body ONLY FROM THE NECK DOWN.   Do not use on face/ open                           Wound or open sores. Avoid contact with eyes, ears mouth and genitals (private parts).                       Wash face,  Genitals (private parts) with your normal soap.             6.  Wash thoroughly, paying special attention to the area where your surgery  will be performed.              7.  Thoroughly rinse your body with warm water from the neck down.             8.  DO NOT shower/wash with your normal soap after using and rinsing off the CHG Soap.            9.  Pat yourself dry with a clean towel.            10.  Wear clean pajamas.            11.  Place clean sheets on your bed the night of your first shower and do not  sleep with pets.   ON THE DAY OF SURGERY : Do not apply any lotions/deodorants the morning of surgery.  Please wear clean clothes to the hospital/surgery center.         FAILURE TO FOLLOW THESE INSTRUCTIONS MAY RESULT IN THE CANCELLATION OF YOUR SURGERY   PATIENT SIGNATURE_________________________________   NURSE SIGNATURE__________________________________   ________________________________________________________________________

## 2023-11-10 NOTE — Anesthesia Preprocedure Evaluation (Signed)
 Anesthesia Evaluation  Patient identified by MRN, date of birth, ID band Patient awake    Reviewed: Allergy & Precautions, NPO status , Patient's Chart, lab work & pertinent test results  Airway Mallampati: II  TM Distance: >3 FB Neck ROM: Full    Dental  (+) Teeth Intact, Dental Advisory Given   Pulmonary neg pulmonary ROS   Pulmonary exam normal breath sounds clear to auscultation       Cardiovascular hypertension (162/108 preop, 130/80 normally per pt), Pt. on medications Normal cardiovascular exam Rhythm:Regular Rate:Normal     Neuro/Psych  PSYCHIATRIC DISORDERS Anxiety     negative neurological ROS     GI/Hepatic Neg liver ROS,GERD  ,,S/p gastric sleeve   Endo/Other  Obesity BMI 39  Renal/GU negative Renal ROS  negative genitourinary   Musculoskeletal negative musculoskeletal ROS (+)    Abdominal  (+) + obese  Peds  Hematology  (+) Blood dyscrasia, anemia Hb 10.5   Anesthesia Other Findings   Reproductive/Obstetrics negative OB ROS                              Anesthesia Physical Anesthesia Plan  ASA: 3  Anesthesia Plan: General   Post-op Pain Management: Tylenol  PO (pre-op)*, Toradol  IV (intra-op)*, Ketamine IV* and Dilaudid  IV   Induction: Intravenous  PONV Risk Score and Plan: 4 or greater and Ondansetron , Dexamethasone , Midazolam , Treatment may vary due to age or medical condition and Scopolamine  patch - Pre-op  Airway Management Planned: Oral ETT  Additional Equipment: None  Intra-op Plan:   Post-operative Plan: Extubation in OR  Informed Consent: I have reviewed the patients History and Physical, chart, labs and discussed the procedure including the risks, benefits and alternatives for the proposed anesthesia with the patient or authorized representative who has indicated his/her understanding and acceptance.     Dental advisory given  Plan Discussed with:  CRNA  Anesthesia Plan Comments:          Anesthesia Quick Evaluation

## 2023-11-11 ENCOUNTER — Ambulatory Visit (HOSPITAL_COMMUNITY): Payer: Self-pay | Admitting: Physician Assistant

## 2023-11-11 ENCOUNTER — Encounter (HOSPITAL_COMMUNITY)
Admission: RE | Disposition: A | Discharge status: Home / Self Care | Payer: Self-pay | Source: Ambulatory Visit | Attending: General Surgery

## 2023-11-11 ENCOUNTER — Ambulatory Visit (HOSPITAL_COMMUNITY)
Admission: RE | Admit: 2023-11-11 | Discharge: 2023-11-11 | Disposition: A | Discharge status: Home / Self Care | Source: Ambulatory Visit | Attending: General Surgery | Admitting: General Surgery

## 2023-11-11 ENCOUNTER — Other Ambulatory Visit: Payer: Self-pay | Admitting: Family

## 2023-11-11 ENCOUNTER — Other Ambulatory Visit (HOSPITAL_COMMUNITY): Payer: Self-pay

## 2023-11-11 ENCOUNTER — Other Ambulatory Visit: Payer: Self-pay

## 2023-11-11 ENCOUNTER — Encounter (HOSPITAL_COMMUNITY): Payer: Self-pay | Admitting: General Surgery

## 2023-11-11 DIAGNOSIS — K8010 Calculus of gallbladder with chronic cholecystitis without obstruction: Secondary | ICD-10-CM | POA: Diagnosis not present

## 2023-11-11 DIAGNOSIS — Z6838 Body mass index (BMI) 38.0-38.9, adult: Secondary | ICD-10-CM | POA: Diagnosis not present

## 2023-11-11 DIAGNOSIS — E66813 Obesity, class 3: Secondary | ICD-10-CM | POA: Insufficient documentation

## 2023-11-11 DIAGNOSIS — E66812 Obesity, class 2: Secondary | ICD-10-CM | POA: Diagnosis not present

## 2023-11-11 DIAGNOSIS — K8066 Calculus of gallbladder and bile duct with acute and chronic cholecystitis without obstruction: Secondary | ICD-10-CM | POA: Diagnosis not present

## 2023-11-11 DIAGNOSIS — K8020 Calculus of gallbladder without cholecystitis without obstruction: Secondary | ICD-10-CM | POA: Diagnosis present

## 2023-11-11 DIAGNOSIS — Z9884 Bariatric surgery status: Secondary | ICD-10-CM | POA: Diagnosis not present

## 2023-11-11 DIAGNOSIS — I10 Essential (primary) hypertension: Secondary | ICD-10-CM | POA: Insufficient documentation

## 2023-11-11 DIAGNOSIS — Z6839 Body mass index (BMI) 39.0-39.9, adult: Secondary | ICD-10-CM | POA: Diagnosis not present

## 2023-11-11 DIAGNOSIS — Z79899 Other long term (current) drug therapy: Secondary | ICD-10-CM | POA: Diagnosis not present

## 2023-11-11 DIAGNOSIS — R7989 Other specified abnormal findings of blood chemistry: Secondary | ICD-10-CM

## 2023-11-11 DIAGNOSIS — Z01818 Encounter for other preprocedural examination: Secondary | ICD-10-CM

## 2023-11-11 DIAGNOSIS — K8012 Calculus of gallbladder with acute and chronic cholecystitis without obstruction: Secondary | ICD-10-CM | POA: Diagnosis not present

## 2023-11-11 DIAGNOSIS — R7303 Prediabetes: Secondary | ICD-10-CM

## 2023-11-11 DIAGNOSIS — G479 Sleep disorder, unspecified: Secondary | ICD-10-CM

## 2023-11-11 HISTORY — PX: CHOLECYSTECTOMY: SHX55

## 2023-11-11 LAB — POCT PREGNANCY, URINE: Preg Test, Ur: NEGATIVE

## 2023-11-11 SURGERY — LAPAROSCOPIC CHOLECYSTECTOMY WITH INTRAOPERATIVE CHOLANGIOGRAM
Anesthesia: General | Site: Abdomen

## 2023-11-11 MED ORDER — SCOPOLAMINE 1 MG/3DAYS TD PT72
1.0000 | MEDICATED_PATCH | TRANSDERMAL | Status: DC
  Administered 2023-11-11: 1.5 mg via TRANSDERMAL
  Filled 2023-11-11: qty 1

## 2023-11-11 MED ORDER — ROCURONIUM BROMIDE 10 MG/ML (PF) SYRINGE
PREFILLED_SYRINGE | INTRAVENOUS | Status: DC | PRN
  Administered 2023-11-11: 10 mg via INTRAVENOUS
  Administered 2023-11-11: 70 mg via INTRAVENOUS

## 2023-11-11 MED ORDER — BUPIVACAINE-EPINEPHRINE (PF) 0.25% -1:200000 IJ SOLN
INTRAMUSCULAR | Status: AC
  Filled 2023-11-11: qty 30

## 2023-11-11 MED ORDER — OXYCODONE HCL 5 MG PO TABS
ORAL_TABLET | ORAL | Status: AC
Start: 2023-11-11 — End: 2023-11-11
  Filled 2023-11-11: qty 1

## 2023-11-11 MED ORDER — ONDANSETRON HCL 4 MG/2ML IJ SOLN
INTRAMUSCULAR | Status: AC
  Filled 2023-11-11: qty 2

## 2023-11-11 MED ORDER — ACETAMINOPHEN 500 MG PO TABS
1000.0000 mg | ORAL_TABLET | Freq: Once | ORAL | Status: AC
  Administered 2023-11-11: 1000 mg via ORAL
  Filled 2023-11-11: qty 2

## 2023-11-11 MED ORDER — DEXAMETHASONE SODIUM PHOSPHATE 10 MG/ML IJ SOLN
INTRAMUSCULAR | Status: DC | PRN
  Administered 2023-11-11: 10 mg via INTRAVENOUS

## 2023-11-11 MED ORDER — LACTATED RINGERS IR SOLN
Status: DC | PRN
  Administered 2023-11-11: 1000 mL

## 2023-11-11 MED ORDER — FENTANYL CITRATE (PF) 250 MCG/5ML IJ SOLN
INTRAMUSCULAR | Status: AC
  Filled 2023-11-11: qty 5

## 2023-11-11 MED ORDER — KETOROLAC TROMETHAMINE 30 MG/ML IJ SOLN: 30.0000 mg | Freq: Once | INTRAMUSCULAR | Status: DC | PRN

## 2023-11-11 MED ORDER — ONDANSETRON HCL 4 MG/2ML IJ SOLN: 4.0000 mg | Freq: Once | INTRAMUSCULAR | Status: DC | PRN

## 2023-11-11 MED ORDER — HYDROMORPHONE HCL 1 MG/ML IJ SOLN
0.2500 mg | INTRAMUSCULAR | Status: DC | PRN
  Administered 2023-11-11 (×2): 0.5 mg via INTRAVENOUS

## 2023-11-11 MED ORDER — CHLORHEXIDINE GLUCONATE CLOTH 2 % EX PADS: 6.0000 | MEDICATED_PAD | Freq: Once | CUTANEOUS | Status: DC

## 2023-11-11 MED ORDER — SPY AGENT GREEN - (INDOCYANINE FOR INJECTION)
1.2500 mg | Freq: Once | INTRAMUSCULAR | Status: AC
  Administered 2023-11-11: 1.25 mg via INTRAVENOUS
  Filled 2023-11-11: qty 10

## 2023-11-11 MED ORDER — AMISULPRIDE (ANTIEMETIC) 5 MG/2ML IV SOLN: 10.0000 mg | Freq: Once | INTRAVENOUS | Status: DC | PRN

## 2023-11-11 MED ORDER — OXYCODONE HCL 5 MG PO TABS
5.0000 mg | ORAL_TABLET | Freq: Once | ORAL | Status: AC | PRN
  Administered 2023-11-11: 5 mg via ORAL

## 2023-11-11 MED ORDER — KETOROLAC TROMETHAMINE 30 MG/ML IJ SOLN
INTRAMUSCULAR | Status: DC | PRN
  Administered 2023-11-11: 30 mg via INTRAVENOUS

## 2023-11-11 MED ORDER — KETAMINE HCL 50 MG/5ML IJ SOSY
PREFILLED_SYRINGE | INTRAMUSCULAR | Status: AC
Start: 2023-11-11 — End: 2023-11-11
  Filled 2023-11-11: qty 5

## 2023-11-11 MED ORDER — FENTANYL CITRATE (PF) 250 MCG/5ML IJ SOLN
INTRAMUSCULAR | Status: DC | PRN
  Administered 2023-11-11: 50 ug via INTRAVENOUS
  Administered 2023-11-11: 100 ug via INTRAVENOUS
  Administered 2023-11-11 (×2): 50 ug via INTRAVENOUS

## 2023-11-11 MED ORDER — ROCURONIUM BROMIDE 10 MG/ML (PF) SYRINGE
PREFILLED_SYRINGE | INTRAVENOUS | Status: AC
  Filled 2023-11-11: qty 10

## 2023-11-11 MED ORDER — LIDOCAINE HCL (PF) 2 % IJ SOLN
INTRAMUSCULAR | Status: AC
  Filled 2023-11-11: qty 5

## 2023-11-11 MED ORDER — DEXAMETHASONE SODIUM PHOSPHATE 10 MG/ML IJ SOLN
INTRAMUSCULAR | Status: AC
  Filled 2023-11-11: qty 1

## 2023-11-11 MED ORDER — ACETAMINOPHEN 500 MG PO TABS
1000.0000 mg | ORAL_TABLET | Freq: Three times a day (TID) | ORAL | Status: AC
Start: 2023-11-11 — End: 2023-11-16

## 2023-11-11 MED ORDER — LACTATED RINGERS IV SOLN: INTRAVENOUS | Status: DC

## 2023-11-11 MED ORDER — OXYCODONE HCL 5 MG PO TABS
5.0000 mg | ORAL_TABLET | Freq: Four times a day (QID) | ORAL | 0 refills | Status: DC | PRN
  Filled 2023-11-11: qty 15, 4d supply, fill #0

## 2023-11-11 MED ORDER — SODIUM CHLORIDE (PF) 0.9 % IJ SOLN
INTRAMUSCULAR | Status: AC
Start: 2023-11-11 — End: 2023-11-11
  Filled 2023-11-11: qty 10

## 2023-11-11 MED ORDER — KETAMINE HCL 50 MG/5ML IJ SOSY
PREFILLED_SYRINGE | INTRAMUSCULAR | Status: DC | PRN
  Administered 2023-11-11: 30 mg via INTRAVENOUS
  Administered 2023-11-11: 10 mg via INTRAVENOUS

## 2023-11-11 MED ORDER — OXYCODONE HCL 5 MG/5ML PO SOLN: 5.0000 mg | Freq: Once | ORAL | Status: AC | PRN

## 2023-11-11 MED ORDER — PROPOFOL 10 MG/ML IV BOLUS
INTRAVENOUS | Status: DC | PRN
  Administered 2023-11-11: 200 mg via INTRAVENOUS

## 2023-11-11 MED ORDER — BUPIVACAINE-EPINEPHRINE 0.25% -1:200000 IJ SOLN
INTRAMUSCULAR | Status: DC | PRN
  Administered 2023-11-11: 30 mL

## 2023-11-11 MED ORDER — HYDROMORPHONE HCL 1 MG/ML IJ SOLN
INTRAMUSCULAR | Status: DC | PRN
  Administered 2023-11-11 (×2): .6 mg via INTRAVENOUS
  Administered 2023-11-11: .4 mg via INTRAVENOUS

## 2023-11-11 MED ORDER — CHLORHEXIDINE GLUCONATE 0.12 % MT SOLN
15.0000 mL | Freq: Once | OROMUCOSAL | Status: AC
  Administered 2023-11-11: 15 mL via OROMUCOSAL

## 2023-11-11 MED ORDER — HYDROMORPHONE HCL 2 MG/ML IJ SOLN
INTRAMUSCULAR | Status: AC
  Filled 2023-11-11: qty 1

## 2023-11-11 MED ORDER — ONDANSETRON HCL 4 MG/2ML IJ SOLN
INTRAMUSCULAR | Status: DC | PRN
  Administered 2023-11-11: 4 mg via INTRAVENOUS

## 2023-11-11 MED ORDER — SUGAMMADEX SODIUM 200 MG/2ML IV SOLN
INTRAVENOUS | Status: DC | PRN
  Administered 2023-11-11: 200 mg via INTRAVENOUS

## 2023-11-11 MED ORDER — TRAZODONE HCL 50 MG PO TABS
50.0000 mg | ORAL_TABLET | Freq: Every evening | ORAL | 3 refills | Status: DC
  Filled 2023-11-11 – 2023-11-28 (×3): qty 30, 15d supply, fill #0
  Filled 2023-11-28 – 2023-12-26 (×3): qty 30, 15d supply, fill #1

## 2023-11-11 MED ORDER — ORAL CARE MOUTH RINSE: 15.0000 mL | Freq: Once | OROMUCOSAL | Status: AC

## 2023-11-11 MED ORDER — MIDAZOLAM HCL 2 MG/2ML IJ SOLN
INTRAMUSCULAR | Status: AC
  Filled 2023-11-11: qty 2

## 2023-11-11 MED ORDER — SODIUM CHLORIDE 0.9 % IR SOLN
Status: DC | PRN
  Administered 2023-11-11: 1000 mL

## 2023-11-11 MED ORDER — HYDROMORPHONE HCL 1 MG/ML IJ SOLN
INTRAMUSCULAR | Status: AC
Start: 2023-11-11 — End: 2023-11-11
  Filled 2023-11-11: qty 1

## 2023-11-11 MED ORDER — KETOROLAC TROMETHAMINE 30 MG/ML IJ SOLN
INTRAMUSCULAR | Status: AC
  Filled 2023-11-11: qty 1

## 2023-11-11 MED ORDER — PROPOFOL 10 MG/ML IV BOLUS
INTRAVENOUS | Status: AC
  Filled 2023-11-11: qty 20

## 2023-11-11 MED ORDER — LIDOCAINE HCL (CARDIAC) PF 100 MG/5ML IV SOSY
PREFILLED_SYRINGE | INTRAVENOUS | Status: DC | PRN
  Administered 2023-11-11: 60 mg via INTRAVENOUS

## 2023-11-11 MED ORDER — MIDAZOLAM HCL 2 MG/2ML IJ SOLN
INTRAMUSCULAR | Status: DC | PRN
  Administered 2023-11-11: 2 mg via INTRAVENOUS

## 2023-11-11 MED ORDER — STERILE WATER FOR IRRIGATION IR SOLN
Status: DC | PRN
  Administered 2023-11-11: 1000 mL

## 2023-11-11 MED ORDER — CEFAZOLIN SODIUM-DEXTROSE 2-4 GM/100ML-% IV SOLN
2.0000 g | INTRAVENOUS | Status: AC
  Administered 2023-11-11: 2 g via INTRAVENOUS
  Filled 2023-11-11: qty 100

## 2023-11-11 MED ORDER — MEPERIDINE HCL 25 MG/ML IJ SOLN: 6.2500 mg | INTRAMUSCULAR | Status: DC | PRN

## 2023-11-11 SURGICAL SUPPLY — 50 items
APPLICATOR ARISTA FLEXITIP XL (MISCELLANEOUS) IMPLANT
BAG COUNTER SPONGE SURGICOUNT (BAG) IMPLANT
CABLE HIGH FREQUENCY MONO STRZ (ELECTRODE) ×2 IMPLANT
CHLORAPREP W/TINT 26 (MISCELLANEOUS) ×2 IMPLANT
CLIP APPLIE 5 13 M/L LIGAMAX5 (MISCELLANEOUS) IMPLANT
CLIP APPLIE ROT 10 11.4 M/L (STAPLE) IMPLANT
CLIP LIGATING HEMO O LOK GREEN (MISCELLANEOUS) IMPLANT
COVER MAYO STAND XLG (MISCELLANEOUS) IMPLANT
COVER SURGICAL LIGHT HANDLE (MISCELLANEOUS) ×2 IMPLANT
DERMABOND ADVANCED .7 DNX12 (GAUZE/BANDAGES/DRESSINGS) IMPLANT
DEVICE SUTURE ENDOST 10MM (ENDOMECHANICALS) IMPLANT
DRAIN CHANNEL 19F RND (DRAIN) IMPLANT
DRAPE C-ARM 42X120 X-RAY (DRAPES) IMPLANT
DRSG TEGADERM 2-3/8X2-3/4 SM (GAUZE/BANDAGES/DRESSINGS) ×6 IMPLANT
DRSG TEGADERM 4X4.75 (GAUZE/BANDAGES/DRESSINGS) ×2 IMPLANT
ELECT REM PT RETURN 15FT ADLT (MISCELLANEOUS) ×2 IMPLANT
ENDOLOOP SUT PDS II 0 18 (SUTURE) IMPLANT
EVACUATOR SILICONE 100CC (DRAIN) IMPLANT
GAUZE SPONGE 2X2 8PLY STRL LF (GAUZE/BANDAGES/DRESSINGS) ×2 IMPLANT
GLOVE BIO SURGEON STRL SZ7.5 (GLOVE) ×2 IMPLANT
GLOVE INDICATOR 8.0 STRL GRN (GLOVE) ×2 IMPLANT
GOWN STRL REUS W/ TWL XL LVL3 (GOWN DISPOSABLE) ×2 IMPLANT
GRASPER SUT TROCAR 14GX15 (MISCELLANEOUS) IMPLANT
HEMOSTAT ARISTA ABSORB 3G PWDR (HEMOSTASIS) IMPLANT
HEMOSTAT SNOW SURGICEL 2X4 (HEMOSTASIS) IMPLANT
IRRIGATION SUCT STRKRFLW 2 WTP (MISCELLANEOUS) ×2 IMPLANT
KIT BASIN OR (CUSTOM PROCEDURE TRAY) ×2 IMPLANT
KIT TURNOVER KIT A (KITS) ×2 IMPLANT
LHOOK LAP DISP 36CM (ELECTROSURGICAL) IMPLANT
POUCH RETRIEVAL ECOSAC 10 (ENDOMECHANICALS) ×2 IMPLANT
RELOAD SUT SNGL STCH ABSRB 2-0 (ENDOMECHANICALS) IMPLANT
SCISSORS LAP 5X35 DISP (ENDOMECHANICALS) ×2 IMPLANT
SET CHOLANGIOGRAPH MIX (MISCELLANEOUS) IMPLANT
SET TUBE SMOKE EVAC HIGH FLOW (TUBING) ×2 IMPLANT
SHEARS HARMONIC 36 ACE (MISCELLANEOUS) IMPLANT
SLEEVE ADV FIXATION 5X100MM (TROCAR) ×2 IMPLANT
SPIKE FLUID TRANSFER (MISCELLANEOUS) ×2 IMPLANT
SPONGE DRAIN TRACH 4X4 STRL 2S (GAUZE/BANDAGES/DRESSINGS) IMPLANT
STRIP CLOSURE SKIN 1/2X4 (GAUZE/BANDAGES/DRESSINGS) ×2 IMPLANT
SUT ETHILON 2 0 PS N (SUTURE) IMPLANT
SUT MNCRL AB 4-0 PS2 18 (SUTURE) ×2 IMPLANT
SUT VIC AB 0 UR5 27 (SUTURE) IMPLANT
SUT VICRYL 0 TIES 12 18 (SUTURE) IMPLANT
SUT VICRYL 0 UR6 27IN ABS (SUTURE) IMPLANT
TOWEL OR 17X26 10 PK STRL BLUE (TOWEL DISPOSABLE) ×2 IMPLANT
TRAY LAPAROSCOPIC (CUSTOM PROCEDURE TRAY) ×2 IMPLANT
TROCAR ADV FIXATION 12X100MM (TROCAR) IMPLANT
TROCAR ADV FIXATION 5X100MM (TROCAR) ×2 IMPLANT
TROCAR BALLN 12MMX100 BLUNT (TROCAR) IMPLANT
TROCAR XCEL NON-BLD 5MMX100MML (ENDOMECHANICALS) IMPLANT

## 2023-11-11 NOTE — Anesthesia Postprocedure Evaluation (Signed)
 Anesthesia Post Note  Patient: Michelle Wilson  Procedure(s) Performed: LAPAROSCOPIC CHOLECYSTECTOMY (Abdomen)     Patient location during evaluation: PACU Anesthesia Type: General Level of consciousness: awake and alert, oriented and patient cooperative Pain management: pain level controlled Vital Signs Assessment: post-procedure vital signs reviewed and stable Respiratory status: spontaneous breathing, nonlabored ventilation and respiratory function stable Cardiovascular status: blood pressure returned to baseline and stable Postop Assessment: no apparent nausea or vomiting Anesthetic complications: no   No notable events documented.  Last Vitals:  Vitals:   11/11/23 1045 11/11/23 1055  BP: (!) 169/100   Pulse: 100   Resp: 11   Temp: 36.7 C   SpO2: 100% 100%    Last Pain:  Vitals:   11/11/23 1051  TempSrc:   PainSc: 6                  Almarie CHRISTELLA Marchi

## 2023-11-11 NOTE — H&P (Signed)
 CC: here for surgery  Requesting provider: n/a  HPI: Michelle Wilson is an 48 y.o. female who is here for lap chole. Had presented with elevated LFTs.  MRCP confirmed CBD stones. Underwent ERCP with stone extraction and stent placement on 7/7. Doing well after that. No f/v/c/n/v. No abd pain. Lfts trending down. H/o sleeve gastrectomy  Past Medical History:  Diagnosis Date   Anemia    Anxiety    GERD (gastroesophageal reflux disease)    pregnancy   Hypertension    Aldomet  250 mg Q day   IBS (irritable bowel syndrome)    no issues currently   Infection    Yeast inf; no frequent   Pre-diabetes     Past Surgical History:  Procedure Laterality Date   BILATERAL SALPINGECTOMY  05/31/2012   Procedure: BILATERAL SALPINGECTOMY;  Surgeon: Shanda SHAUNNA Muscat, MD;  Location: [facility];  Service: Obstetrics;  Laterality: Bilateral;   BREAST REDUCTION SURGERY  1997   CESAREAN SECTION  2011   CESAREAN SECTION  05/31/2012   Procedure: CESAREAN SECTION;  Surgeon: Shanda SHAUNNA Muscat, MD;  Location: [facility];  Service: Obstetrics;  Laterality: N/A;   ERCP N/A 10/31/2023   Procedure: ERCP, WITH INTERVENTION IF INDICATED;  Surgeon: Wilhelmenia Aloha Raddle., MD;  Location: [facility];  Service: Gastroenterology;  Laterality: N/A;   LAPAROSCOPIC GASTRIC SLEEVE RESECTION N/A 04/12/2016   Procedure: LAPAROSCOPIC GASTRIC SLEEVE RESECTION, UPPER ENDO;  Surgeon: Donnice Lunger, MD;  Location: [facility];  Service: General;  Laterality: N/A;   REDUCTION MAMMAPLASTY Bilateral     Family History  Problem Relation Age of Onset   Hypertension Mother    Heart disease Mother 65       Heart attack   Hyperlipidemia Mother    Hypertension Father    Pancreatitis Father    Hypertension Maternal Grandmother    Hypertension Maternal Grandfather    Stroke Maternal Grandfather    Hypertension Paternal Grandmother    Cervical cancer Paternal Grandmother    Hypertension Paternal Grandfather    Hyperthyroidism Maternal  Aunt        x 3   Schizophrenia Maternal Aunt    Lung cancer Maternal Uncle    Cancer - Lung Maternal Uncle     Social:  reports that she has never smoked. She has been exposed to tobacco smoke. She has never used smokeless tobacco. She reports that she does not drink alcohol and does not use drugs.  Allergies:  Allergies  Allergen Reactions   Ambien  [Zolpidem ] Other (See Comments)    Sleep walking     Medications: I have reviewed the patient's current medications.   ROS - all of the below systems have been reviewed with the patient and positives are indicated with bold text General: chills, fever or night sweats Eyes: blurry vision or double vision ENT: epistaxis or sore throat Allergy/Immunology: itchy/watery eyes or nasal congestion Hematologic/Lymphatic: bleeding problems, blood clots or swollen lymph nodes Endocrine: temperature intolerance or unexpected weight changes Breast: new or changing breast lumps or nipple discharge Resp: cough, shortness of breath, or wheezing CV: chest pain or dyspnea on exertion GI: as per HPI GU: dysuria, trouble voiding, or hematuria MSK: joint pain or joint stiffness Neuro: TIA or stroke symptoms Derm: pruritus and skin lesion changes Psych: anxiety and depression  PE Blood pressure (!) 162/108, pulse 94, temperature 98.7 F (37.1 C), temperature source Oral, resp. rate 16, height 5' 4 (1.626 m), weight 104 kg, SpO2 100%. Constitutional: NAD; conversant; no deformities  Eyes: Moist conjunctiva; no lid lag; anicteric; PERRL Neck: Trachea midline; no thyromegaly Lungs: Normal respiratory effort; no tactile fremitus CV: RRR; no palpable thrills; no pitting edema GI: Abd soft, nt, old trocar scars; no palpable hepatosplenomegaly MSK: Normal gait; no clubbing/cyanosis Psychiatric: Appropriate affect; alert and oriented x3 Lymphatic: No palpable cervical or axillary lymphadenopathy Skin:  Results for orders placed or performed during  the hospital encounter of 11/11/23 (from the past 48 hours)  Pregnancy, urine POC     Status: None   Collection Time: 11/11/23  5:53 AM  Result Value Ref Range   Preg Test, Ur NEGATIVE NEGATIVE    Comment:        THE SENSITIVITY OF THIS METHODOLOGY IS >20 mIU/mL.        Latest Ref Rng & Units 11/07/2023    8:52 AM 09/27/2023    9:04 AM 09/23/2023    7:59 AM  Hepatic Function  Total Protein 6.5 - 8.1 g/dL 7.4  8.0  7.3   Albumin 3.5 - 5.0 g/dL 3.4  4.1  4.1   AST 15 - 41 U/L 21  51  43   ALT 0 - 44 U/L 19  51  45   Alk Phosphatase 38 - 126 U/L 151  300  307   Total Bilirubin 0.0 - 1.2 mg/dL 0.6  0.6  0.7   Bilirubin, Direct 0.0 - 0.3 mg/dL  0.2      No results found.  Imaging: reviewed  A/P: Michelle Wilson is an 48 y.o. female with  Cholelithiasis H/o cbd stones s/p ERCP with stent H/o sleeve gastrectomy To OR for lap chole with poss ioc Iv abx All questions and answered   Camellia CHRISTELLA. Tanda, MD, FACS General, Bariatric, & Minimally Invasive Surgery [facility]

## 2023-11-11 NOTE — Anesthesia Procedure Notes (Signed)
 Procedure Name: Intubation Date/Time: 11/11/2023 7:40 AM  Performed by: Dasie Nena PARAS, CRNAPre-anesthesia Checklist: Patient identified, Emergency Drugs available, Suction available, Patient being monitored and Timeout performed Patient Re-evaluated:Patient Re-evaluated prior to induction Oxygen Delivery Method: Circle system utilized Preoxygenation: Pre-oxygenation with 100% oxygen Induction Type: IV induction Ventilation: Mask ventilation without difficulty Laryngoscope Size: Miller and 3 Grade View: Grade I Tube type: Oral Tube size: 7.0 mm Number of attempts: 1 Airway Equipment and Method: Stylet Placement Confirmation: ETT inserted through vocal cords under direct vision, positive ETCO2 and breath sounds checked- equal and bilateral Secured at: 22 cm Tube secured with: Tape Dental Injury: Teeth and Oropharynx as per pre-operative assessment

## 2023-11-11 NOTE — Transfer of Care (Signed)
 Immediate Anesthesia Transfer of Care Note  Patient: Michelle Wilson  Procedure(s) Performed: LAPAROSCOPIC CHOLECYSTECTOMY (Abdomen)  Patient Location: PACU  Anesthesia Type:General  Level of Consciousness: awake, drowsy, and patient cooperative  Airway & Oxygen Therapy: Patient Spontanous Breathing and Patient connected to face mask oxygen  Post-op Assessment: Report given to RN and Post -op Vital signs reviewed and stable  Post vital signs: Reviewed and stable  Last Vitals:  Vitals Value Taken Time  BP 169/100 11/11/23 10:45  Temp    Pulse 96 11/11/23 10:46  Resp 15 11/11/23 10:46  SpO2 100 % 11/11/23 10:46  Vitals shown include unfiled device data.  Last Pain:  Vitals:   11/11/23 0616  TempSrc: Oral  PainSc:          Complications: No notable events documented.

## 2023-11-11 NOTE — Discharge Instructions (Signed)
 LAPAROSCOPIC SURGERY: POST OP INSTRUCTIONS Always review your discharge instruction sheet given to you by the facility where your surgery was performed. IF YOU HAVE DISABILITY OR FAMILY LEAVE FORMS, YOU MUST BRING THEM TO THE OFFICE FOR PROCESSING.   DO NOT GIVE THEM TO YOUR DOCTOR.  PAIN CONTROL  First take acetaminophen  (Tylenol ) AND/or ibuprofen  (Advil ) to control your pain after surgery.  Follow directions on package.  Taking acetaminophen  (Tylenol ) and/or ibuprofen  (Advil ) regularly after surgery will help to control your pain and lower the amount of prescription pain medication you may need.  You should not take more than 3,000 mg (3 grams) of acetaminophen  (Tylenol ) in 24 hours.  You should not take ibuprofen  (Advil ), aleve, motrin , naprosyn or other NSAIDS if you have a history of stomach ulcers or chronic kidney disease.  A prescription for pain medication may be given to you upon discharge.  Take your pain medication as prescribed, if you still have uncontrolled pain after taking acetaminophen  (Tylenol ) or ibuprofen  (Advil ). Use ice packs to help control pain. If you need a refill on your pain medication, please contact your pharmacy.  They will contact our office to request authorization. Prescriptions will not be filled after 5pm or on week-ends.  HOME MEDICATIONS Take your usually prescribed medications unless otherwise directed.  DIET You should follow a light diet the first few days after arrival home.  Be sure to include lots of fluids daily. Avoid fatty, fried foods.   CONSTIPATION It is common to experience some constipation after surgery and if you are taking pain medication.  Increasing fluid intake and taking a stool softener (such as Colace) will usually help or prevent this problem from occurring.  A mild laxative (Milk of Magnesia or Miralax) should be taken according to package instructions if there are no bowel movements after 48 hours.  WOUND/INCISION CARE Most  patients will experience some swelling and bruising in the area of the incisions.  Ice packs will help.  Swelling and bruising can take several days to resolve.  Unless discharge instructions indicate otherwise, follow guidelines below  STERI-STRIPS - you may remove your outer bandages 48 hours after surgery, and you may shower at that time.  You have steri-strips (small skin tapes) in place directly over the incision.  These strips should be left on the skin for 7-10 days.   DERMABOND/SKIN GLUE - you may shower in 24 hours.  The glue will flake off over the next 2-3 weeks. Any sutures or staples will be removed at the office during your follow-up visit.  ACTIVITIES You may resume regular (light) daily activities beginning the next day--such as daily self-care, walking, climbing stairs--gradually increasing activities as tolerated.  You may have sexual intercourse when it is comfortable.  Refrain from any heavy lifting or straining until approved by your doctor. You may drive when you are no longer taking prescription pain medication, you can comfortably wear a seatbelt, and you can safely maneuver your car and apply brakes.  FOLLOW-UP You should see your doctor in the office for a follow-up appointment approximately 2-3 weeks after your surgery.  You should have been given your post-op/follow-up appointment when your surgery was scheduled.  If you did not receive a post-op/follow-up appointment, make sure that you call for this appointment within a day or two after you arrive home to insure a convenient appointment time.  OTHER INSTRUCTIONS Call for green drainage in surgical drain  WHEN TO CALL YOUR DOCTOR: Fever over 101.0 Green drainage in  surgical drainage Inability to urinate Continued bleeding from incision. Increased pain, redness, or drainage from the incision. Increasing abdominal pain  The clinic staff is available to answer your questions during regular business hours.  Please  don't hesitate to call and ask to speak to one of the nurses for clinical concerns.  If you have a medical emergency, go to the nearest emergency room or call 911.  A surgeon from [facility] is always on call at the hospital. [address], [place], [place]  [address] ? [address] [address], [place], [place]   [address] [phone] ? [phone] ? FAX [phone] Web site: [email or web address]

## 2023-11-11 NOTE — Op Note (Signed)
 Michelle Wilson [id number] January 13, 1976 11/11/2023  Laparoscopic Cholecystectomy with near infrared fluorescent cholangiography procedure Note  Indications: This patient presents with symptomatic gallbladder disease and will undergo laparoscopic cholecystectomy.  The patient had undergone routine blood work and was found to have elevated liver function test.  This prompted an abdominal ultrasound which showed gallstones as well as possibly a dilated common bile duct.  Her LFTs did not normalize so she underwent MRCP which demonstrated significant choledocholithiasis.  She underwent ERCP and had approximately 20 gallstones extracted from her common bile duct and sphincterotomy was performed along with placement of a common bile duct stent.  She presented today for cholecystectomy.  Patient has remote history of sleeve gastrectomy  Pre-operative Diagnosis: Symptomatic cholelithiasis, history of choledocholithiasis status post ERCP  Post-operative Diagnosis: Severe chronic calculus cholecystitis, history of choledocholithiasis status post ERCP  Surgeon: Camellia Blush MD FACS  Assistants: Lonni Pizza, MD FACS; Mae Flock MD PGY-4  Anesthesia: General endotracheal anesthesia  Procedure Details  The patient was seen again in the Holding Room. The risks, benefits, complications, treatment options, and expected outcomes were discussed with the patient. The possibilities of reaction to medication, pulmonary aspiration, perforation of viscus, bleeding, recurrent infection, finding a normal gallbladder, the need for additional procedures, failure to diagnose a condition, the possible need to convert to an open procedure, and creating a complication requiring transfusion or operation were discussed with the patient. The likelihood of improving the patient's symptoms with return to their baseline status is good.  The patient and/or family concurred with the proposed plan, giving informed consent. The  site of surgery properly noted. The patient was taken to Operating Room, identified as Michelle Wilson and the procedure verified as Laparoscopic Cholecystectomy with ICG dye.  A Time Out was held and the above information confirmed. Antibiotic prophylaxis was administered.    ICG dye was administered preoperatively.    General endotracheal anesthesia was then administered and tolerated well. After the induction, the abdomen was prepped with Chloraprep and draped in the sterile fashion. The patient was positioned in the supine position.  Access to the abdomen was obtained via the Optiview technique.  A small incision was made to the left of the midline just below the subcostal margin.  Then using a 0 degree 5 mm laparoscope through a 5 mm trocar the laparoscope was advanced through all layers of the abdominal wall and carefully entered the abdominal cavity.  Pneumoperitoneum was smoothly established up to a patient pressure of 15 mmHg without any change in patient vital signs.  The laparoscope was advanced in the abdominal cavity was surveilled.  There is no evidence of injury to surrounding structures. We positioned the patient in reverse Trendelenburg, tilted slightly to the patient's left.  A 5 mm port was placed in the umbilical position under direct visualization.  The optical entry trocar was exchanged for a 12 mm trocar.  Two 5-mm ports were placed in the right upper quadrant. All skin incisions were infiltrated with a local anesthetic agent before making the incision and placing the trocars.     The gallbladder was identified, the fundus grasped and retracted cephalad.  Patient had a contracted gallbladder full of stones with chronic wall thickening.  Adhesions were lysed bluntly and with the electrocautery where indicated, taking care not to injure any adjacent organs or viscus.  There was significant woody inflammation of the gallbladder including the infundibulum.  Using a combination of  blunt dissection with the suction catheter I was  able to identify the infundibulum and retracted laterally with a fenestrated grasper.  Patient had a gallstone in the infundibulum.  An assistant was requested to join the operating room to assist with tissue retraction as well as mobilization of the gallbladder and identification of structures.  I was able to identify the cystic artery and come around it with a [place]  dissector.  Lateral wall of the gallbladder was mobilized off of the liver with hook electrocautery.  We identified an window medial to the cystic artery and were able to get around it with a [place]  dissector.  It appeared to be at the lower level of the infundibulum at the junction of the cystic duct.  A critical view of the cystic duct and cystic artery was obtained.  The cystic duct was dilated.  The cystic duct was clearly identified and bluntly dissected circumferentially.  Utilizing the Stryker camera system near infrared fluorescent activity was visualized in the liver, common hepatic duct and common bile duct and small bowel.  There was no fluorescent activity visualized in the cystic duct.  Given the chronic inflammation at the level of the infundibulum and cystic duct juncture and the dilation this would not accommodate a clip nor an Endoloop.  I ended up dividing the gallbladder at the level of the infundibulum at the junction of the cystic duct with harmonic scalpel.  There were several stones sitting in the cuff of the cystic duct which were extracted.  The cystic artery which had been identified & dissected free was ligated with clips and divided as well.   I placed a PDS Endoloop across the base/neck of the gallbladder to prevent stone spillage from the gallbladder.  The gallbladder was dissected from the liver bed in retrograde fashion with harmonic scalpel and electrocautery.  The cystic duct stump was closed with a running 2-0 Vicryl Endo Stitch we did extract 2 additional stones  prior to closing the stump.  There was no leakage of bile before or after closure of the cystic duct stump.  The stones that had spilled were extracted from the abdomen.  The gallbladder was removed and placed in an Ecco sac.  The liver bed was irrigated and inspected. Hemostasis was achieved with the electrocautery. Copious irrigation was utilized and was repeatedly aspirated until clear.  A 19 [place] JP drain was placed through the left upper quadrant trocar and brought out through the most right lateral trocar site incision and secured to the skin with a 2-0 nylon.  The drain was placed in the gallbladder fossa over the cystic duct stump.  The gallbladder and Ecco sac were then removed through the left upper quadrant port site.  We did have to enlarge the fascial defect in order to extract the gallbladder and specimen bag  We again inspected the right upper quadrant for hemostasis.  The left upper quadrant port site was closed with four erupted 0 Vicryl using a PMI suture passer with laparoscopic guidance.  The left upper quadrant closure was inspected and there was no air leak and nothing trapped within the closure. Pneumoperitoneum was released as we removed the trocars.  4-0 Monocryl was used to close the skin.    steri-strips, and clean dressings were applied. The patient was then extubated and brought to the recovery room in stable condition. Instrument, sponge, and needle counts were correct at closure and at the conclusion of the case.   Findings: Chronic cholecystitis with Cholelithiasis Surgical drain left in the gallbladder fossa Oversewed cystic  duct stump with a running 2-0 Vicryl Endo Stitch  Estimated Blood Loss: Minimal         Drains: 19 fr         Specimens: Gallbladder           Complications: None; patient tolerated the procedure well.         Disposition: PACU - hemodynamically stable.         Condition: stable  Camellia HERO. Tanda, MD, FACS General, Bariatric, & Minimally  Invasive Surgery [facility],  A [facility]

## 2023-11-12 ENCOUNTER — Encounter (HOSPITAL_COMMUNITY): Payer: Self-pay | Admitting: General Surgery

## 2023-11-14 LAB — SURGICAL PATHOLOGY

## 2023-11-28 ENCOUNTER — Other Ambulatory Visit (HOSPITAL_BASED_OUTPATIENT_CLINIC_OR_DEPARTMENT_OTHER): Payer: Self-pay

## 2023-11-28 ENCOUNTER — Other Ambulatory Visit (HOSPITAL_COMMUNITY): Payer: Self-pay

## 2023-11-28 ENCOUNTER — Other Ambulatory Visit: Payer: Self-pay

## 2023-12-26 ENCOUNTER — Other Ambulatory Visit: Payer: Self-pay | Admitting: Family

## 2023-12-26 ENCOUNTER — Other Ambulatory Visit (HOSPITAL_BASED_OUTPATIENT_CLINIC_OR_DEPARTMENT_OTHER): Payer: Self-pay

## 2023-12-26 DIAGNOSIS — F419 Anxiety disorder, unspecified: Secondary | ICD-10-CM

## 2023-12-26 MED ORDER — ALPRAZOLAM 1 MG PO TABS
0.5000 mg | ORAL_TABLET | Freq: Every day | ORAL | 3 refills | Status: DC
  Filled 2023-12-26: qty 30, 30d supply, fill #0

## 2023-12-27 ENCOUNTER — Other Ambulatory Visit (HOSPITAL_BASED_OUTPATIENT_CLINIC_OR_DEPARTMENT_OTHER): Payer: Self-pay

## 2023-12-29 ENCOUNTER — Other Ambulatory Visit (HOSPITAL_COMMUNITY): Payer: Self-pay | Admitting: Family

## 2023-12-29 DIAGNOSIS — Z1231 Encounter for screening mammogram for malignant neoplasm of breast: Secondary | ICD-10-CM

## 2024-01-05 ENCOUNTER — Encounter: Payer: Self-pay | Admitting: Family

## 2024-01-05 ENCOUNTER — Ambulatory Visit: Payer: Self-pay | Admitting: Family

## 2024-01-05 NOTE — Telephone Encounter (Signed)
 This RN made first attempt to contact pt with no answer. A voicemail was left with call back number provided.     Copied from CRM [id number]. Topic: Clinical - Medical Advice >> Jan 05, 2024  3:14 PM Robinson H wrote: Reason for CRM: Patient calling to speak with providers assistant states she's not sleeping and the medication that Ginger has her on isn't working.  Nate [phone]

## 2024-01-05 NOTE — Telephone Encounter (Signed)
 FYI Only or Action Required?: Action required by provider: medication refill request. Pt requesting other medications for sleeping. Pt states that they hydroxyzine  and trazodone  are not helping anymore. Pt states that she does take xanax  in AM and PM and that is helpful.  Patient was last seen in primary care on 09/23/2023 by Corwin Antu, FNP.  Called Nurse Triage reporting Insomnia.  Symptoms began a week ago.  Interventions attempted: Nothing.  Symptoms are: unchanged.  Triage Disposition: See PCP Within 2 Weeks  Patient/caregiver understands and will follow disposition?: No, wishes to speak with PCP  Reason for Disposition  Requesting prescription medicine for sleep (sleeping pill)  Answer Assessment - Initial Assessment Questions 1. DESCRIPTION: Tell me about your sleeping problem. (e.g., waking frequently during night, sleeping during day and awake at night, trouble falling asleep) How bad is it?      Pt states that she is having a difficulty falling and staying asleep 2. ONSET: How long have you been having trouble sleeping? (e.g., days, weeks, months; longstanding sleep problems)     States about a week 3. DAYTIME SLEEP PATTERN: How much time do you spend sleeping or napping during the day?     denies 4. STRESSORS: Is there anything that is making you feel stressed? Is there something that worries you?     denies 5. PAIN: Do you have any pain that is keeping you awake? (e.g., back pain, joint pain) If Yes, ask How bad is the pain? (e.g., scale 0-10; mild, moderate, severe).     denies 6. CAFFEINE: Do you drink caffeinated beverages? If Yes, ask How much each day? (e.g., coffee, tea, colas)     denies 7. ALCOHOL USE OR SUBSTANCE USE: Do you drink alcohol or use any substances?     denies 8. MEDICINE CHANGE: Has there been any recent change in medicines? (e.g., new medicine started, stopped, or dose changed).      denies 9. TREATMENT: What have you  done so far to treat this sleep problem? (e.g., prescription or OTC sleep medicines, herbal or dietary supplements, cannabis, relaxation strategies)     Hydroxyzine , xanax , trazodone ,  10. OTHER SYMPTOMS: Do you have any other symptoms?  (e.g., difficulty breathing)       denies  Protocols used: Insomnia-A-AH

## 2024-01-06 NOTE — Telephone Encounter (Signed)
 Pt to make appt I'd like to discuss with her ideas/options

## 2024-01-09 ENCOUNTER — Encounter: Payer: Self-pay | Admitting: Family

## 2024-01-09 ENCOUNTER — Ambulatory Visit: Admitting: Family

## 2024-01-09 ENCOUNTER — Other Ambulatory Visit (HOSPITAL_BASED_OUTPATIENT_CLINIC_OR_DEPARTMENT_OTHER): Payer: Self-pay

## 2024-01-09 VITALS — BP 148/92 | HR 84 | Ht 65.0 in | Wt 232.0 lb

## 2024-01-09 DIAGNOSIS — G479 Sleep disorder, unspecified: Secondary | ICD-10-CM | POA: Diagnosis not present

## 2024-01-09 DIAGNOSIS — F419 Anxiety disorder, unspecified: Secondary | ICD-10-CM

## 2024-01-09 DIAGNOSIS — N951 Menopausal and female climacteric states: Secondary | ICD-10-CM | POA: Diagnosis not present

## 2024-01-09 DIAGNOSIS — F411 Generalized anxiety disorder: Secondary | ICD-10-CM | POA: Diagnosis not present

## 2024-01-09 MED ORDER — ESZOPICLONE 1 MG PO TABS
1.0000 mg | ORAL_TABLET | Freq: Every evening | ORAL | 1 refills | Status: DC | PRN
  Filled 2024-01-09: qty 30, 30d supply, fill #0

## 2024-01-09 MED ORDER — ALPRAZOLAM 0.5 MG PO TABS: 0.5000 mg | ORAL_TABLET | Freq: Two times a day (BID) | ORAL | 0 refills | Status: DC | PRN

## 2024-01-09 NOTE — Telephone Encounter (Signed)
 Pt called and schedule appt

## 2024-01-09 NOTE — Telephone Encounter (Signed)
 Saw patient already, thank you for speaking with the patient.  Please see note for further information.

## 2024-01-09 NOTE — Patient Instructions (Signed)
Please work on the following to help with sleep:  -Sleep only long enough to feel rested then get out of bed -Go to bed and get up at the same time every day. -Do not try to force yourself to sleep. If you can't sleep, get out of bed adn try again later. -Have coffee, tea, and other foods that have caffeine only in the morning. -Avoid alcohol -Keep your bedroom dark, cool, quiet, and free of reminders of work or other things that cause you stress -Exercise several days a week, but not right before bed -Avoid looking at phones or reading devices ("e-books") that give off light before bed. This can make it harder to fall asleep  

## 2024-01-09 NOTE — Progress Notes (Signed)
 Established Patient Office Visit  Subjective:      CC:  Chief Complaint  Patient presents with   Insomnia    HPI: Michelle Wilson is a 48 y.o. female presenting on 01/09/2024 for Insomnia .  Discussed the use of AI scribe software for clinical note transcription with the patient, who gave verbal consent to proceed.  History of Present Illness Michelle Wilson is a 48 year old female who presents with sleep disturbances and hot flashes.  She has been experiencing significant sleep disturbances over the past few weeks, characterized by frequent awakenings throughout the night. She reports that her sleep disturbances are associated with frequent hot flashes and that she feels exhausted. She has been using black cohosh, which provides inconsistent relief, and hydroxyzine , which is no longer effective for her sleep issues.  She experiences hot flashes and increased irritability, which she associates with her perimenopausal state. Her menstrual cycles have become irregular over the past two years, with periods occurring as infrequently as every four to five months. Previously, her cycles were regular every 28 days.  Her current medication regimen includes Lexapro  at the maximum dose, which she finds essential for her mental well-being. She also takes alprazolam , half a tablet in the morning and evening, to manage her anxiety. She has tried trazodone  at 100 mg for sleep but discontinued it due to adverse effects such as dizziness and feeling 'drugged out'.  She has a history of gallbladder issues, having had an ERCP with stent placement due to gallstones. She reports having had 20 gallstones in her gallbladder, which was removed after the procedure. Despite the severity, she experienced minimal symptoms, only occasional nausea, and no significant pain.         Social history:  Relevant past medical, surgical, family and social history reviewed and updated as indicated. Interim  medical history since our last visit reviewed.  Allergies and medications reviewed and updated.  DATA REVIEWED: CHART IN EPIC     ROS: Negative unless specifically indicated above in HPI.    Current Outpatient Medications:    atorvastatin  (LIPITOR) 10 MG tablet, Take 1 tablet (10 mg total) by mouth daily., Disp: 90 tablet, Rfl: 3   escitalopram  (LEXAPRO ) 20 MG tablet, Take 1 tablet (20 mg total) by mouth daily., Disp: 90 tablet, Rfl: 3   hydrOXYzine  (VISTARIL ) 50 MG capsule, Take 1 capsule (50 mg total) by mouth at bedtime as needed for anxiety, Disp: 30 capsule, Rfl: 2   losartan  (COZAAR ) 100 MG tablet, Take 1 tablet (100 mg total) by mouth daily., Disp: 90 tablet, Rfl: 3   Multiple Vitamin (MULTIVITAMIN WITH MINERALS) TABS tablet, Take 1 tablet by mouth in the morning., Disp: , Rfl:    ALPRAZolam  (XANAX ) 0.5 MG tablet, Take 1 tablet (0.5 mg total) by mouth 2 (two) times daily as needed for anxiety., Disp: 30 tablet, Rfl: 0   eszopiclone  (LUNESTA ) 1 MG TABS tablet, Take 1 tablet (1 mg total) by mouth at bedtime as needed for sleep. Take immediately before bedtime, Disp: 30 tablet, Rfl: 1        Objective:        BP (!) 148/92 (BP Location: Right Arm, Patient Position: Sitting, Cuff Size: Large)   Pulse 84   Ht 5' 5 (1.651 m)   Wt 232 lb (105.2 kg)   SpO2 100%   BMI 38.61 kg/m   Physical Exam   Wt Readings from Last 3 Encounters:  01/09/24 232 lb (105.2 kg)  11/11/23 229 lb 4.5 oz (104 kg)  11/07/23 229 lb 4.5 oz (104 kg)    Physical Exam Constitutional:      General: She is not in acute distress.    Appearance: Normal appearance. She is normal weight. She is not ill-appearing, toxic-appearing or diaphoretic.  HENT:     Head: Normocephalic.  Cardiovascular:     Rate and Rhythm: Normal rate.  Pulmonary:     Effort: Pulmonary effort is normal.  Musculoskeletal:        General: Normal range of motion.  Neurological:     General: No focal deficit present.      Mental Status: She is alert and oriented to person, place, and time. Mental status is at baseline.  Psychiatric:        Mood and Affect: Mood normal.        Behavior: Behavior normal.        Thought Content: Thought content normal.        Judgment: Judgment normal.          Results DIAGNOSTIC ERCP with stone extraction: Two stents placed, gallbladder filled with cholelithiasis, 20 choledocholithiasis in the common bile duct  Assessment & Plan:   Assessment and Plan Assessment & Plan Perimenopausal symptoms with hot flashes and sleep disturbance Perimenopausal symptoms include hot flashes and sleep disturbances. Black cohosh and hydroxyzine  are ineffective. Trazodone  causes dizziness and is not well-tolerated. Lexapro  is at maximum dose and effective for anxiety. Effexor was considered but she prefers not to switch from Lexapro . Non-hormonal options like Veozah were discussed but she is primarily concerned with sleep. Lunesta  is considered a viable option for sleep management. - Discontinue trazodone . - Prescribe Lunesta  1 mg for sleep, to be taken 30 minutes before bedtime. - Discontinue hydroxyzine  when starting Lunesta . - Advise not to take Xanax  at night when taking Lunesta . -pdmp reviewed drug contract signed  Chronic insomnia Chronic insomnia is exacerbated by perimenopausal symptoms. Hydroxyzine  and trazodone  have been ineffective or poorly tolerated. Lunesta  is considered a viable option for sleep management. - Prescribe Lunesta  1 mg for sleep, to be taken 30 minutes before bedtime. - Discontinue hydroxyzine  when starting Lunesta . - Advise not to take Xanax  at night when taking Lunesta . -basic sleep hygiene important  Generalized anxiety disorder Generalized anxiety disorder is managed with Lexapro  at maximum dose. Xanax  is used at 0.5 mg twice daily as needed. Concerns exist about long-term use of Xanax  due to potential links to Alzheimer's and dementia. Effexor was  considered as an alternative to Lexapro  to potentially reduce Xanax  use, but she prefers to continue with Lexapro . - Continue Lexapro  at current dose. - Continue Xanax  0.5 mg twice daily as needed. - Discuss potential future switch to Effexor if current regimen becomes ineffective.  Post-ERCP with stent removal She has an upcoming ERCP with stent removal scheduled. Previous ERCP was performed due to gallstones, and stents were placed post-gallbladder removal. - Proceed with scheduled ERCP with stent removal.  Recording duration: 17 minutes      Return in about 6 weeks (around 02/20/2024) for f/u sleep medication .     Ginger Patrick, MSN, APRN, FNP-C [facility]

## 2024-01-10 ENCOUNTER — Encounter (HOSPITAL_COMMUNITY): Payer: Self-pay | Admitting: Gastroenterology

## 2024-01-10 ENCOUNTER — Other Ambulatory Visit: Payer: Self-pay

## 2024-01-10 ENCOUNTER — Other Ambulatory Visit (HOSPITAL_BASED_OUTPATIENT_CLINIC_OR_DEPARTMENT_OTHER): Payer: Self-pay

## 2024-01-10 NOTE — Progress Notes (Signed)
 Anesthesia Review:  PCP: dugal LVO 01/09/2024  Cardiologist :  PPM/ ICD: Device Orders: Rep Notified:  Chest x-ray : EKG : 10/10/2023  Echo : Stress test: Cardiac Cath :   Activity level:  Sleep Study/ CPAP : Fasting Blood Sugar :      / Checks Blood Sugar -- times a day:    Blood Thinner/ Instructions /Last Dose: ASA / Instructions/ Last Dose :    11/11/2023- lap chole  ERCP-11/11/2023

## 2024-01-16 ENCOUNTER — Ambulatory Visit: Admitting: Family

## 2024-01-17 ENCOUNTER — Other Ambulatory Visit: Payer: Self-pay | Admitting: Family

## 2024-01-17 DIAGNOSIS — E785 Hyperlipidemia, unspecified: Secondary | ICD-10-CM

## 2024-01-18 ENCOUNTER — Encounter (HOSPITAL_COMMUNITY): Payer: Self-pay | Admitting: Gastroenterology

## 2024-01-18 ENCOUNTER — Telehealth: Payer: Self-pay

## 2024-01-18 ENCOUNTER — Ambulatory Visit (HOSPITAL_COMMUNITY): Payer: Self-pay | Admitting: Anesthesiology

## 2024-01-18 ENCOUNTER — Other Ambulatory Visit (HOSPITAL_COMMUNITY): Payer: Self-pay

## 2024-01-18 ENCOUNTER — Ambulatory Visit (HOSPITAL_COMMUNITY)

## 2024-01-18 ENCOUNTER — Ambulatory Visit (HOSPITAL_COMMUNITY)
Admission: RE | Admit: 2024-01-18 | Discharge: 2024-01-18 | Disposition: A | Discharge status: Home / Self Care | Attending: Gastroenterology | Admitting: Gastroenterology

## 2024-01-18 ENCOUNTER — Other Ambulatory Visit (HOSPITAL_BASED_OUTPATIENT_CLINIC_OR_DEPARTMENT_OTHER): Payer: Self-pay

## 2024-01-18 ENCOUNTER — Encounter (HOSPITAL_COMMUNITY)
Admission: RE | Disposition: A | Discharge status: Home / Self Care | Payer: Self-pay | Source: Home / Self Care | Attending: Gastroenterology

## 2024-01-18 ENCOUNTER — Other Ambulatory Visit: Payer: Self-pay

## 2024-01-18 ENCOUNTER — Ambulatory Visit (HOSPITAL_BASED_OUTPATIENT_CLINIC_OR_DEPARTMENT_OTHER): Payer: Self-pay | Admitting: Anesthesiology

## 2024-01-18 DIAGNOSIS — Z4689 Encounter for fitting and adjustment of other specified devices: Secondary | ICD-10-CM

## 2024-01-18 DIAGNOSIS — K8021 Calculus of gallbladder without cholecystitis with obstruction: Secondary | ICD-10-CM | POA: Diagnosis not present

## 2024-01-18 DIAGNOSIS — K8050 Calculus of bile duct without cholangitis or cholecystitis without obstruction: Secondary | ICD-10-CM | POA: Diagnosis not present

## 2024-01-18 DIAGNOSIS — D649 Anemia, unspecified: Secondary | ICD-10-CM | POA: Insufficient documentation

## 2024-01-18 DIAGNOSIS — F419 Anxiety disorder, unspecified: Secondary | ICD-10-CM | POA: Diagnosis not present

## 2024-01-18 DIAGNOSIS — Z9049 Acquired absence of other specified parts of digestive tract: Secondary | ICD-10-CM

## 2024-01-18 DIAGNOSIS — I10 Essential (primary) hypertension: Secondary | ICD-10-CM | POA: Insufficient documentation

## 2024-01-18 DIAGNOSIS — K838 Other specified diseases of biliary tract: Secondary | ICD-10-CM

## 2024-01-18 DIAGNOSIS — T85590A Other mechanical complication of bile duct prosthesis, initial encounter: Secondary | ICD-10-CM

## 2024-01-18 DIAGNOSIS — Z9889 Other specified postprocedural states: Secondary | ICD-10-CM

## 2024-01-18 HISTORY — PX: ERCP: SHX5425

## 2024-01-18 HISTORY — PX: SPYGLASS LITHOTRIPSY: SHX5537

## 2024-01-18 SURGERY — ERCP, WITH INTERVENTION IF INDICATED
Anesthesia: General

## 2024-01-18 MED ORDER — MIDAZOLAM HCL 2 MG/2ML IJ SOLN
INTRAMUSCULAR | Status: AC
  Filled 2024-01-18: qty 2

## 2024-01-18 MED ORDER — LIDOCAINE 2% (20 MG/ML) 5 ML SYRINGE
INTRAMUSCULAR | Status: DC | PRN
  Administered 2024-01-18: 100 mg via INTRAVENOUS

## 2024-01-18 MED ORDER — EPHEDRINE SULFATE-NACL 50-0.9 MG/10ML-% IV SOSY
PREFILLED_SYRINGE | INTRAVENOUS | Status: DC | PRN
Start: 2024-01-18 — End: 2024-01-18
  Administered 2024-01-18: 10 mg via INTRAVENOUS
  Administered 2024-01-18: 5 mg via INTRAVENOUS

## 2024-01-18 MED ORDER — PHENYLEPHRINE 80 MCG/ML (10ML) SYRINGE FOR IV PUSH (FOR BLOOD PRESSURE SUPPORT)
PREFILLED_SYRINGE | INTRAVENOUS | Status: DC | PRN
Start: 2024-01-18 — End: 2024-01-18
  Administered 2024-01-18: 80 ug via INTRAVENOUS

## 2024-01-18 MED ORDER — GLUCAGON HCL RDNA (DIAGNOSTIC) 1 MG IJ SOLR
INTRAMUSCULAR | Status: AC
  Filled 2024-01-18: qty 1

## 2024-01-18 MED ORDER — DEXAMETHASONE SODIUM PHOSPHATE 10 MG/ML IJ SOLN
INTRAMUSCULAR | Status: DC | PRN
Start: 2024-01-18 — End: 2024-01-18
  Administered 2024-01-18: 4 mg via INTRAVENOUS

## 2024-01-18 MED ORDER — FENTANYL CITRATE (PF) 100 MCG/2ML IJ SOLN
INTRAMUSCULAR | Status: AC
  Filled 2024-01-18: qty 2

## 2024-01-18 MED ORDER — ONDANSETRON HCL 4 MG/2ML IJ SOLN
INTRAMUSCULAR | Status: DC | PRN
  Administered 2024-01-18: 4 mg via INTRAVENOUS

## 2024-01-18 MED ORDER — DICLOFENAC SUPPOSITORY 100 MG
RECTAL | Status: AC
  Filled 2024-01-18: qty 1

## 2024-01-18 MED ORDER — FENTANYL CITRATE (PF) 100 MCG/2ML IJ SOLN
INTRAMUSCULAR | Status: DC | PRN
  Administered 2024-01-18: 100 ug via INTRAVENOUS

## 2024-01-18 MED ORDER — SODIUM CHLORIDE 0.9 % IV SOLN: INTRAVENOUS | Status: DC

## 2024-01-18 MED ORDER — PROPOFOL 10 MG/ML IV BOLUS
INTRAVENOUS | Status: DC | PRN
  Administered 2024-01-18: 30 mg via INTRAVENOUS
  Administered 2024-01-18: 150 mg via INTRAVENOUS

## 2024-01-18 MED ORDER — CIPROFLOXACIN HCL 500 MG PO TABS
500.0000 mg | ORAL_TABLET | Freq: Two times a day (BID) | ORAL | 0 refills | Status: AC
Start: 2024-01-18 — End: 2024-01-21
  Filled 2024-01-18: qty 6, 3d supply, fill #0

## 2024-01-18 MED ORDER — DICLOFENAC SUPPOSITORY 100 MG
RECTAL | Status: DC | PRN
Start: 2024-01-18 — End: 2024-01-18
  Administered 2024-01-18: 100 mg via RECTAL

## 2024-01-18 MED ORDER — CIPROFLOXACIN IN D5W 400 MG/200ML IV SOLN
INTRAVENOUS | Status: DC | PRN
Start: 2024-01-18 — End: 2024-01-18
  Administered 2024-01-18: 400 mg via INTRAVENOUS

## 2024-01-18 MED ORDER — PROPOFOL 10 MG/ML IV BOLUS
INTRAVENOUS | Status: AC
  Filled 2024-01-18: qty 20

## 2024-01-18 MED ORDER — GLYCOPYRROLATE 0.2 MG/ML IJ SOLN
INTRAMUSCULAR | Status: DC | PRN
  Administered 2024-01-18: .1 mg via INTRAVENOUS

## 2024-01-18 MED ORDER — SODIUM CHLORIDE 0.9 % IV SOLN
INTRAVENOUS | Status: DC | PRN
  Administered 2024-01-18: 80 mL

## 2024-01-18 MED ORDER — CIPROFLOXACIN IN D5W 400 MG/200ML IV SOLN
INTRAVENOUS | Status: AC
  Filled 2024-01-18: qty 200

## 2024-01-18 MED ORDER — ROCURONIUM BROMIDE 100 MG/10ML IV SOLN
INTRAVENOUS | Status: DC | PRN
  Administered 2024-01-18: 60 mg via INTRAVENOUS

## 2024-01-18 MED ORDER — MIDAZOLAM HCL 5 MG/5ML IJ SOLN
INTRAMUSCULAR | Status: DC | PRN
  Administered 2024-01-18: 1 mg via INTRAVENOUS

## 2024-01-18 MED ORDER — SUGAMMADEX SODIUM 200 MG/2ML IV SOLN
INTRAVENOUS | Status: DC | PRN
Start: 2024-01-18 — End: 2024-01-18
  Administered 2024-01-18: 50 mg via INTRAVENOUS
  Administered 2024-01-18: 200 mg via INTRAVENOUS

## 2024-01-18 MED ORDER — ATORVASTATIN CALCIUM 10 MG PO TABS
10.0000 mg | ORAL_TABLET | Freq: Every day | ORAL | 3 refills | Status: AC
  Filled 2024-01-18: qty 90, 90d supply, fill #0
  Filled 2024-05-11: qty 90, 90d supply, fill #1

## 2024-01-18 MED ORDER — LACTATED RINGERS IV SOLN: INTRAVENOUS | Status: DC | PRN

## 2024-01-18 NOTE — Transfer of Care (Signed)
 Immediate Anesthesia Transfer of Care Note  Patient: Michelle Wilson  Procedure(s) Performed: ERCP, WITH INTERVENTION IF INDICATED ERCP, WITH CHOLANGIOSCOPY AND LITHOTRIPSY  Patient Location: Endoscopy Unit  Anesthesia Type:General  Level of Consciousness: awake, alert , oriented, and patient cooperative  Airway & Oxygen Therapy: Patient Spontanous Breathing and Patient connected to nasal cannula oxygen  Post-op Assessment: Report given to RN and Post -op Vital signs reviewed and stable  Post vital signs: Reviewed and stable  Last Vitals:  Vitals Value Taken Time  BP 176/82 01/18/24 11:31  Temp 36.6 C 01/18/24 11:31  Pulse 78 01/18/24 11:33  Resp 13 01/18/24 11:33  SpO2 100 % 01/18/24 11:33  Vitals shown include unfiled device data.  Last Pain:  Vitals:   01/18/24 1131  TempSrc: Temporal  PainSc: 0-No pain         Complications: There were no known notable events for this encounter.

## 2024-01-18 NOTE — H&P (Signed)
 GASTROENTEROLOGY PROCEDURE H&P NOTE   Primary Care Physician: Corwin Antu, FNP  HPI: Michelle Wilson is a 48 y.o. female who presents for ERCP for biliary stent pull and final cleanout of previous significant Choledocholithiasis and prior sphincterotomy and removal as well as evaluation post cholecystectomy.  Past Medical History:  Diagnosis Date   Anemia    Anxiety    Hypertension    Aldomet  250 mg Q day   IBS (irritable bowel syndrome)    no issues currently   Infection    Yeast inf; no frequent   Past Surgical History:  Procedure Laterality Date   BILATERAL SALPINGECTOMY  05/31/2012   Procedure: BILATERAL SALPINGECTOMY;  Surgeon: Shanda SHAUNNA Muscat, MD;  Location: [facility];  Service: Obstetrics;  Laterality: Bilateral;   BREAST REDUCTION SURGERY  1997   CESAREAN SECTION  2011   CESAREAN SECTION  05/31/2012   Procedure: CESAREAN SECTION;  Surgeon: Shanda SHAUNNA Muscat, MD;  Location: [facility];  Service: Obstetrics;  Laterality: N/A;   CHOLECYSTECTOMY N/A 11/11/2023   Procedure: LAPAROSCOPIC CHOLECYSTECTOMY;  Surgeon: Tanda Locus, MD;  Location: [facility];  Service: General;  Laterality: N/A;   ERCP N/A 10/31/2023   Procedure: ERCP, WITH INTERVENTION IF INDICATED;  Surgeon: Wilhelmenia Aloha Raddle., MD;  Location: [facility];  Service: Gastroenterology;  Laterality: N/A;   LAPAROSCOPIC GASTRIC SLEEVE RESECTION N/A 04/12/2016   Procedure: LAPAROSCOPIC GASTRIC SLEEVE RESECTION, UPPER ENDO;  Surgeon: Donnice Lunger, MD;  Location: [facility];  Service: General;  Laterality: N/A;   REDUCTION MAMMAPLASTY Bilateral    Current Facility-Administered Medications  Medication Dose Route Frequency Provider Last Rate Last Admin   0.9 %  sodium chloride  infusion   Intravenous Continuous Mansouraty, Aloha Raddle., MD        Current Facility-Administered Medications:    0.9 %  sodium chloride  infusion, , Intravenous, Continuous, Mansouraty, Aloha Raddle., MD Allergies  Allergen Reactions   Ambien   Elior.Economy ] Other (See Comments)    Sleep walking    Trazodone  And Nefazodone Other (See Comments)    dizziness   Family History  Problem Relation Age of Onset   Hypertension Mother    Heart disease Mother 71       Heart attack   Hyperlipidemia Mother    Hypertension Father    Pancreatitis Father    Hypertension Maternal Grandmother    Hypertension Maternal Grandfather    Stroke Maternal Grandfather    Hypertension Paternal Grandmother    Cervical cancer Paternal Grandmother    Hypertension Paternal Grandfather    Hyperthyroidism Maternal Aunt        x 3   Schizophrenia Maternal Aunt    Lung cancer Maternal Uncle    Cancer - Lung Maternal Uncle    Social History   Socioeconomic History   Marital status: Married    Spouse name: Evangelia Whitaker   Number of children: 2   Years of education: Not on file   Highest education level: Associate degree: academic program  Occupational History   Occupation: Surveyor, minerals: [employer]    Comment: Writer  Tobacco Use   Smoking status: Never    Passive exposure: Past   Smokeless tobacco: Never  Vaping Use   Vaping status: Never Used  Substance and Sexual Activity   Alcohol use: No   Drug use: No   Sexual activity: Yes    Partners: Male    Birth control/protection: Surgical    Comment: Tubes Removed   Other  Topics Concern   Not on file  Social History Narrative   92 and 25 year old sons (2025)   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   Social Drivers of Corporate investment banker Strain: Low Risk  (01/08/2024)   Overall Financial Resource Strain (CARDIA)    Difficulty of Paying Living Expenses: Not hard at all  Food Insecurity: No Food Insecurity (01/08/2024)   Hunger Vital Sign    Worried About Running Out of Food in the Last Year: Never true    Ran Out of Food in the Last Year: Never true  Transportation Needs: No Transportation Needs (01/08/2024)   PRAPARE - Scientist, research (physical sciences) (Medical): No    Lack of Transportation (Non-Medical): No  Physical Activity: Insufficiently Active (01/08/2024)   Exercise Vital Sign    Days of Exercise per Week: 2 days    Minutes of Exercise per Session: 10 min  Stress: Stress Concern Present (01/08/2024)   [employer] of Occupational Health - Occupational Stress Questionnaire    Feeling of Stress: To some extent  Social Connections: Socially Integrated (01/08/2024)   Social Connection and Isolation Panel    Frequency of Communication with Friends and Family: Twice a week    Frequency of Social Gatherings with Friends and Family: Once a week    Attends Religious Services: 1 to 4 times per year    Active Member of [employer] or Organizations: Yes    Attends Banker Meetings: 1 to 4 times per year    Marital Status: Married  Catering manager Violence: Not on file    Physical Exam: Today's Vitals   01/18/24 0918  Pulse: 98  Resp: 20  Temp: 97.7 F (36.5 C)  TempSrc: Temporal  SpO2: 100%  Weight: 104.3 kg  Height: 5' 5 (1.651 m)  PainSc: 0-No pain   Body mass index is 38.27 kg/m. GEN: NAD EYE: Sclerae anicteric ENT: MMM CV: Non-tachycardic GI: Soft, NT/ND NEURO:  Alert & Oriented x 3  Lab Results: No results for input(s): WBC, HGB, HCT, PLT in the last 72 hours. BMET No results for input(s): NA, K, CL, CO2, GLUCOSE, BUN, CREATININE, CALCIUM  in the last 72 hours. LFT No results for input(s): PROT, ALBUMIN, AST, ALT, ALKPHOS, BILITOT, BILIDIR, IBILI in the last 72 hours. PT/INR No results for input(s): LABPROT, INR in the last 72 hours.   Impression / Plan: This is a 48 y.o.female who presents for ERCP for biliary stent pull and final cleanout of previous significant Choledocholithiasis and prior sphincterotomy and removal as well as evaluation post cholecystectomy.  The risks of an ERCP were discussed at length, including but not limited to  the risk of perforation, bleeding, abdominal pain, post-ERCP pancreatitis (while usually mild can be severe and even life threatening).   The risks and benefits of endoscopic evaluation/treatment were discussed with the patient and/or family; these include but are not limited to the risk of perforation, infection, bleeding, missed lesions, lack of diagnosis, severe illness requiring hospitalization, as well as anesthesia and sedation related illnesses.  The patient's history has been reviewed, patient examined, no change in status, and deemed stable for procedure.  The patient and/or family is agreeable to proceed.    [name], MD [facility] Office # [phone]

## 2024-01-18 NOTE — Telephone Encounter (Signed)
 Lab order has been entered  Pt will be called in 2-4 weeks

## 2024-01-18 NOTE — Discharge Instructions (Signed)

## 2024-01-18 NOTE — Anesthesia Procedure Notes (Signed)
 Procedure Name: Intubation Date/Time: 01/18/2024 10:15 AM  Performed by: Erick Fitz, CRNAPre-anesthesia Checklist: Patient identified, Emergency Drugs available, Suction available, Patient being monitored and Timeout performed Patient Re-evaluated:Patient Re-evaluated prior to induction Oxygen Delivery Method: Circle system utilized Preoxygenation: Pre-oxygenation with 100% oxygen Induction Type: IV induction Ventilation: Mask ventilation without difficulty Laryngoscope Size: Mac and 3 Grade View: Grade I Tube type: Oral Tube size: 7.0 mm Number of attempts: 1 Airway Equipment and Method: Stylet Placement Confirmation: ETT inserted through vocal cords under direct vision, positive ETCO2, CO2 detector and breath sounds checked- equal and bilateral Secured at: 22 cm Tube secured with: Tape (secured with pink Hy-tape) Dental Injury: Teeth and Oropharynx as per pre-operative assessment

## 2024-01-18 NOTE — Telephone Encounter (Signed)
-----   Message from [facility] sent at 01/18/2024 11:35 AM EDT ----- Regarding: Followup Michelle Wilson, This patient needs followup LFTs in 2-4 weeks. History biliary stent and choledocholithiasis. Thanks. GM

## 2024-01-18 NOTE — Anesthesia Preprocedure Evaluation (Signed)
 Anesthesia Evaluation  Patient identified by MRN, date of birth, ID band Patient awake    Reviewed: Allergy & Precautions, NPO status , Patient's Chart, lab work & pertinent test results, reviewed documented beta blocker date and time   History of Anesthesia Complications Negative for: history of anesthetic complications  Airway Mallampati: I  TM Distance: >3 FB     Dental no notable dental hx.    Pulmonary neg COPD   breath sounds clear to auscultation       Cardiovascular hypertension, (-) CAD, (-) Past MI and (-) Cardiac Stents  Rhythm:Regular Rate:Normal     Neuro/Psych neg Seizures PSYCHIATRIC DISORDERS Anxiety        GI/Hepatic ,,,(+) neg Cirrhosis        Endo/Other    Renal/GU Renal disease     Musculoskeletal   Abdominal   Peds  Hematology  (+) Blood dyscrasia, anemia   Anesthesia Other Findings   Reproductive/Obstetrics                              Anesthesia Physical Anesthesia Plan  ASA: 2  Anesthesia Plan: General   Post-op Pain Management:    Induction: Intravenous  PONV Risk Score and Plan: 2 and Ondansetron  and Dexamethasone   Airway Management Planned: Oral ETT  Additional Equipment:   Intra-op Plan:   Post-operative Plan: Extubation in OR  Informed Consent: I have reviewed the patients History and Physical, chart, labs and discussed the procedure including the risks, benefits and alternatives for the proposed anesthesia with the patient or authorized representative who has indicated his/her understanding and acceptance.     Dental advisory given  Plan Discussed with: CRNA  Anesthesia Plan Comments:         Anesthesia Quick Evaluation

## 2024-01-18 NOTE — Op Note (Signed)
 [facility] Patient Name: Michelle Wilson Procedure Date: 01/18/2024 MRN: [id number] Attending MD: [name] , MD, [id number] Date of Birth: 1975/05/07 CSN: [id number] Age: 48 Admit Type: Outpatient Procedure:                ERCP Indications:              Bile duct stone(s), Stent removal, Postop exam:                            Cholecystectomy, Prior Endoscopic Retrograde                            Cholangiopancreatography Providers:                [name], MD, Randall Lines, RN, Haskel Chris, Technician Referring MD:              Medicines:                General Anesthesia, Diclofenac  100 mg rectal, Cipro                             400 mg IV Complications:            No immediate complications. Estimated Blood Loss:     Estimated blood loss was minimal. Procedure:                Pre-Anesthesia Assessment:                           - Prior to the procedure, a History and Physical                            was performed, and patient medications and                            allergies were reviewed. The patient's tolerance of                            previous anesthesia was also reviewed. The risks                            and benefits of the procedure and the sedation                            options and risks were discussed with the patient.                            All questions were answered, and informed consent                            was obtained. Prior Anticoagulants: The patient has                            taken no anticoagulant or antiplatelet agents.  ASA                            Grade Assessment: II - A patient with mild systemic                            disease. After reviewing the risks and benefits,                            the patient was deemed in satisfactory condition to                            undergo the procedure.                           After obtaining informed consent,  the scope was                            passed under direct vision. Throughout the                            procedure, the patient's blood pressure, pulse, and                            oxygen saturations were monitored continuously. The                            [employer] D single use                            duodenoscope was introduced through the mouth, and                            used to inject contrast into and used for direct                            visualization of the bile duct. The ERCP was                            accomplished without difficulty. The patient                            tolerated the procedure. Scope In: Scope Out: Findings:      A scout film of the abdomen was obtained. Surgical clips, consistent       with a previous cholecystectomy, were seen in the area of the right       upper quadrant of the abdomen. A biliary stent was visible on the scout       film.      The esophagus was successfully intubated under direct vision without       detailed examination of the pharynx, larynx, and associated structures,       and upper GI tract. A biliary sphincterotomy had been performed. The       sphincterotomy appeared open. Two plastic biliary stents originating in  the biliary tree were emerging from the major papilla. The stents were       partially occluded. Two stents were removed from the biliary tree using       a snare.      A short 0.025 inch Revolution Raymon was passed into the biliary tree.       The short-nosed traction sphincterotome was passed over the guidewire       and the bile duct was then deeply cannulated. Contrast was injected. I       personally interpreted the bile duct images. Ductal flow of contrast was       adequate. Image quality was adequate. Contrast extended to the hepatic       ducts. Opacification of the entire biliary tree except for the cystic       duct and gallbladder was successful. The  lower third of the main bile       duct contained filling defects thought to be stones and sludge. The       upper third of the main bile duct contained a segmental irregularity -       query at cystic insertion of possible stone impaction. The main bile       duct was mildly dilated. The largest diameter was 12 mm. To discover       objects, the biliary tree was swept with a retrieval balloon. Three       stones were removed. No stones remained. An occlusion cholangiogram was       performed that showed persisting localized irregularity at the upper       third of the bile duct at area of presumed cystic insertion.      Decision made to pursue cholangioscopy to rule out impacted stone. The       bile duct was explored endoscopically using the SpyGlass direct       visualization system. The SpyScope was advanced to the bifurcation.       Visibility with the scope was good. The cystic duct contained one stone,       which was 15 mm in diameter. I could not find a way to get a wire beyond       this area. So only way to remove this was to consider EHL.       Electrohydraulic lithotripsy was successful (450 pulses). The rest of       the cholangioscopy was unremarkable.      We replaced the Revolution jagwire into the biliary duct. To discover       objects, the biliary tree was swept with a retrieval balloon. Sludge was       swept from the duct. A few stone fragments (from recent EHL) were       removed. No stones remained. An occlusion cholangiogram was performed       that showed no further significant biliary pathology and the remaining       cystic duct was able to be opacified.      A pancreatogram was not performed.      The duodenoscope was withdrawn from the patient. Impression:               - Prior biliary sphincterotomy appeared open.                           - Two partially occluded stents from the biliary  tree were seen in the major papilla. These  were                            removed.                           - Filling defects consistent with stones and sludge                            was seen on the cholangiogram.                           - The entire main bile duct was mildly dilated.                           - An irregularity was found in the upper third of                            the main bile duct near the cystic duct insertion.                           - Choledocholithiasis was found. Complete removal                            was accomplished by sweeping.                           - However, the irregularity remained after stone                            extraction.                           - Cholangioscopy performed that showed impacted                            stone within the cystic duct.                           - EHL lithotripsy performed.                           - The biliary tree was swept and debris was                            extracted.                           - Final cholangiogram did not suggest further                            biliary obstruction and the cystic duct was able to                            be visualized without leak. Moderate Sedation:      Not  Applicable - Patient had care per Anesthesia. Recommendation:           - The patient will be observed post-procedure,                            until all discharge criteria are met.                           - Discharge patient to home.                           - Patient has a contact number available for                            emergencies. The signs and symptoms of potential                            delayed complications were discussed with the                            patient. Return to normal activities tomorrow.                            Written discharge instructions were provided to the                            patient.                           - Low fat diet.                           - Observe patient's  clinical course.                           - Cipro  (ciprofloxacin ) 500 mg PO BID for 3 days                            due to use of cholangioscope.                           - Watch for pancreatitis, bleeding, perforation,                            and cholangitis.                           - Check liver enzymes (AST, ALT, alkaline                            phosphatase, bilirubin) in 2-4 weeks.                           - Watch for pancreatitis, bleeding, perforation,  and cholangitis.                           - The findings and recommendations were discussed                            with the patient.                           - The findings and recommendations were discussed                            with the patient's family. Procedure Code(s):        --- Professional ---                           [id number], Endoscopic retrograde                            cholangiopancreatography (ERCP); with destruction                            of calculi, any method (eg, mechanical,                            electrohydraulic, lithotripsy)                           43275, Endoscopic retrograde                            cholangiopancreatography (ERCP); with removal of                            foreign body(s) or stent(s) from biliary/pancreatic                            duct(s)                           43273, Endoscopic cannulation of papilla with                            direct visualization of pancreatic/common bile                            duct(s) (List separately in addition to code(s) for                            primary procedure)                           25671, 26, Endoscopic catheterization of the                            biliary ductal system, radiological supervision and  interpretation Diagnosis Code(s):        --- Professional ---                           T85.590A, Other mechanical complication of bile                             duct prosthesis, initial encounter                           R93.2, Abnormal findings on diagnostic imaging of                            liver and biliary tract                           K80.50, Calculus of bile duct without cholangitis                            or cholecystitis without obstruction                           Z46.59, Encounter for fitting and adjustment of                            other gastrointestinal appliance and device                           Z09, Encounter for follow-up examination after                            completed treatment for conditions other than                            malignant neoplasm                           Z90.49, Acquired absence of other specified parts                            of digestive tract                           Z98.890, Other specified postprocedural states                           K83.8, Other specified diseases of biliary tract CPT copyright 2022 American Medical Association. All rights reserved. The codes documented in this report are preliminary and upon coder review may  be revised to meet current compliance requirements. [name], MD 01/18/2024 11:31:37 AM Number of Addenda: 0

## 2024-01-19 ENCOUNTER — Encounter (HOSPITAL_COMMUNITY): Payer: Self-pay | Admitting: Gastroenterology

## 2024-01-19 NOTE — Anesthesia Postprocedure Evaluation (Signed)
 Anesthesia Post Note  Patient: Michelle Wilson  Procedure(s) Performed: ERCP, WITH INTERVENTION IF INDICATED ERCP, WITH CHOLANGIOSCOPY AND LITHOTRIPSY     Patient location during evaluation: PACU Anesthesia Type: General Level of consciousness: awake and alert Pain management: pain level controlled Vital Signs Assessment: post-procedure vital signs reviewed and stable Respiratory status: spontaneous breathing, nonlabored ventilation, respiratory function stable and patient connected to nasal cannula oxygen Cardiovascular status: blood pressure returned to baseline and stable Postop Assessment: no apparent nausea or vomiting Anesthetic complications: no   There were no known notable events for this encounter.  Last Vitals:  Vitals:   01/18/24 1150 01/18/24 1200  BP: (!) 176/89 (!) 180/72  Pulse: 61 67  Resp: 13 10  Temp:    SpO2: 100% 100%    Last Pain:  Vitals:   01/18/24 1200  TempSrc:   PainSc: 0-No pain                 [place] Michelle Wilson

## 2024-01-20 ENCOUNTER — Other Ambulatory Visit: Payer: Self-pay

## 2024-01-23 ENCOUNTER — Other Ambulatory Visit: Payer: Self-pay | Admitting: Family

## 2024-01-23 DIAGNOSIS — F419 Anxiety disorder, unspecified: Secondary | ICD-10-CM

## 2024-01-23 NOTE — Telephone Encounter (Unsigned)
 Copied from CRM #[id number]. Topic: Clinical - Medication Refill >> Jan 23, 2024 12:33 PM Drema MATSU wrote: Medication: ALPRAZolam  (XANAX ) 0.5 MG tablet * pt wants a 1 month supply  Has the patient contacted their pharmacy? Yes (Agent: If no, request that the patient contact the pharmacy for the refill. If patient does not wish to contact the pharmacy document the reason why and proceed with request.) can't see prescription  (Agent: If yes, when and what did the pharmacy advise?)  This is the patient's preferred pharmacy:  [facility] [address], [address] [place] [address] Phone: [phone] Fax: [phone]  Is this the correct pharmacy for this prescription? Yes If no, delete pharmacy and type the correct one.   Has the prescription been filled recently? Yes  Is the patient out of the medication? No  Has the patient been seen for an appointment in the last year OR does the patient have an upcoming appointment? Yes  Can we respond through MyChart? Yes  Agent: Please be advised that Rx refills may take up to 3 business days. We ask that you follow-up with your pharmacy.

## 2024-01-26 ENCOUNTER — Other Ambulatory Visit (HOSPITAL_BASED_OUTPATIENT_CLINIC_OR_DEPARTMENT_OTHER): Payer: Self-pay

## 2024-01-26 ENCOUNTER — Other Ambulatory Visit: Payer: Self-pay | Admitting: Family

## 2024-01-26 MED ORDER — ALPRAZOLAM 0.5 MG PO TABS: 0.5000 mg | ORAL_TABLET | Freq: Two times a day (BID) | ORAL | 0 refills | Status: DC | PRN

## 2024-01-26 MED ORDER — ALPRAZOLAM 0.5 MG PO TABS
0.5000 mg | ORAL_TABLET | Freq: Two times a day (BID) | ORAL | 2 refills | Status: DC | PRN
  Filled 2024-01-26: qty 30, 15d supply, fill #0
  Filled 2024-02-08: qty 30, 15d supply, fill #1

## 2024-01-26 NOTE — Addendum Note (Signed)
 Addended by: CORWIN ANTU on: 01/26/2024 02:02 PM   Modules accepted: Orders

## 2024-02-09 ENCOUNTER — Other Ambulatory Visit (HOSPITAL_BASED_OUTPATIENT_CLINIC_OR_DEPARTMENT_OTHER): Payer: Self-pay

## 2024-02-09 ENCOUNTER — Other Ambulatory Visit: Payer: Self-pay

## 2024-02-09 ENCOUNTER — Ambulatory Visit: Payer: Self-pay | Admitting: Gastroenterology

## 2024-02-09 ENCOUNTER — Other Ambulatory Visit (INDEPENDENT_AMBULATORY_CARE_PROVIDER_SITE_OTHER)

## 2024-02-09 DIAGNOSIS — Z4689 Encounter for fitting and adjustment of other specified devices: Secondary | ICD-10-CM

## 2024-02-09 DIAGNOSIS — K8050 Calculus of bile duct without cholangitis or cholecystitis without obstruction: Secondary | ICD-10-CM | POA: Diagnosis not present

## 2024-02-09 DIAGNOSIS — Z1231 Encounter for screening mammogram for malignant neoplasm of breast: Secondary | ICD-10-CM

## 2024-02-09 LAB — HEPATIC FUNCTION PANEL
ALT: 14 U/L (ref 0–35)
AST: 18 U/L (ref 0–37)
Albumin: 3.9 g/dL (ref 3.5–5.2)
Alkaline Phosphatase: 125 U/L — ABNORMAL HIGH (ref 39–117)
Bilirubin, Direct: 0.2 mg/dL (ref 0.0–0.3)
Total Bilirubin: 0.4 mg/dL (ref 0.2–1.2)
Total Protein: 7.4 g/dL (ref 6.0–8.3)

## 2024-02-09 NOTE — Progress Notes (Signed)
 Lab order has been entered as ordered

## 2024-02-13 ENCOUNTER — Ambulatory Visit (HOSPITAL_COMMUNITY)
Admission: RE | Admit: 2024-02-13 | Discharge: 2024-02-13 | Disposition: A | Discharge status: Home / Self Care | Source: Ambulatory Visit | Attending: Family | Admitting: Family

## 2024-02-13 DIAGNOSIS — Z1231 Encounter for screening mammogram for malignant neoplasm of breast: Secondary | ICD-10-CM | POA: Diagnosis not present

## 2024-02-16 ENCOUNTER — Ambulatory Visit: Payer: Self-pay | Admitting: Family

## 2024-02-16 ENCOUNTER — Institutional Professional Consult (permissible substitution) (INDEPENDENT_AMBULATORY_CARE_PROVIDER_SITE_OTHER): Admitting: Adult Health

## 2024-02-21 ENCOUNTER — Telehealth (INDEPENDENT_AMBULATORY_CARE_PROVIDER_SITE_OTHER): Admitting: Family

## 2024-02-21 ENCOUNTER — Telehealth: Payer: Self-pay | Admitting: Family

## 2024-02-21 ENCOUNTER — Other Ambulatory Visit (HOSPITAL_BASED_OUTPATIENT_CLINIC_OR_DEPARTMENT_OTHER): Payer: Self-pay

## 2024-02-21 DIAGNOSIS — G479 Sleep disorder, unspecified: Secondary | ICD-10-CM | POA: Diagnosis not present

## 2024-02-21 DIAGNOSIS — N951 Menopausal and female climacteric states: Secondary | ICD-10-CM | POA: Diagnosis not present

## 2024-02-21 DIAGNOSIS — F419 Anxiety disorder, unspecified: Secondary | ICD-10-CM

## 2024-02-21 DIAGNOSIS — F411 Generalized anxiety disorder: Secondary | ICD-10-CM

## 2024-02-21 MED ORDER — ALPRAZOLAM 0.5 MG PO TABS
0.5000 mg | ORAL_TABLET | Freq: Two times a day (BID) | ORAL | 1 refills | Status: DC
  Filled 2024-02-21: qty 60, 30d supply, fill #0
  Filled 2024-03-19: qty 60, 30d supply, fill #1

## 2024-02-21 NOTE — Assessment & Plan Note (Signed)
 Pdmp reviewed Refilled xanax  0.5 mg bid prn  Continue lexapro  20 mg once daily

## 2024-02-21 NOTE — Telephone Encounter (Signed)
 Can you call her to schedule her for a 6 month CPE f/u  Early mornings

## 2024-02-21 NOTE — Assessment & Plan Note (Signed)
 Continue with low sugar diet which has been helpful

## 2024-02-21 NOTE — Progress Notes (Signed)
 Virtual Visit via Video note  I connected with Michelle Wilson on 02/21/24 at home  by video and verified that I am speaking with the correct person using two identifiers.The provider, Ginger Patrick, FNP is located in their office at time of visit.  I discussed the limitations, risks, security and privacy concerns of performing an evaluation and management service by video and the availability of in person appointments. I also discussed with the patient that there may be a patient responsible charge related to this service. The patient expressed understanding and agreed to proceed.  Subjective: PCP: Patrick Ginger, FNP  Chief Complaint  Patient presents with   Medical Management of Chronic Issues    HPI  Has started to work on less sugar intake which has helped her hot flashes significantly, was having frequent awakenings through the night with her hot flashes.   Sleep disorder, tried lunesta  but did not like how it made her feel. She has found if she takes hydroxyzine  then she can also sleep a little bit better. Uses xanax  for anxiety prn, typically 1/2 tablet bid. She is on lexapro  20 mg once daily which has 'helped wonders'  ERCP with stent removal, has been removed and doing wellher alk phos has decreased from 307 five months ago to 125 most recent 12 days ago.   Obesity, has a f/u scheduled with her bariatric surgeon and they are going to discuss 'jump starting her weight loss'   H pylori, treated and now with cure.     ROS: Per HPI  Current Outpatient Medications:    atorvastatin  (LIPITOR) 10 MG tablet, Take 1 tablet (10 mg total) by mouth daily., Disp: 90 tablet, Rfl: 3   escitalopram  (LEXAPRO ) 20 MG tablet, Take 1 tablet (20 mg total) by mouth daily., Disp: 90 tablet, Rfl: 3   hydrOXYzine  (VISTARIL ) 50 MG capsule, Take 1 capsule (50 mg total) by mouth at bedtime as needed for anxiety, Disp: 30 capsule, Rfl: 2   losartan  (COZAAR ) 100 MG tablet, Take 1 tablet (100 mg  total) by mouth daily., Disp: 90 tablet, Rfl: 3   Multiple Vitamin (MULTIVITAMIN WITH MINERALS) TABS tablet, Take 1 tablet by mouth in the morning., Disp: , Rfl:    ALPRAZolam  (XANAX ) 0.5 MG tablet, Take 1 tablet (0.5 mg total) by mouth 2 (two) times daily., Disp: 60 tablet, Rfl: 1  Observations/Objective: Physical Exam Constitutional:      General: She is not in acute distress.    Appearance: Normal appearance. She is normal weight. She is not ill-appearing, toxic-appearing or diaphoretic.  HENT:     Head: Normocephalic.  Pulmonary:     Effort: Pulmonary effort is normal.  Neurological:     General: No focal deficit present.     Mental Status: She is alert and oriented to person, place, and time. Mental status is at baseline.  Psychiatric:        Mood and Affect: Mood normal.        Behavior: Behavior normal.        Thought Content: Thought content normal.        Judgment: Judgment normal.     Assessment and Plan: Sleep difficulties  Anxiety -     ALPRAZolam ; Take 1 tablet (0.5 mg total) by mouth 2 (two) times daily.  Dispense: 60 tablet; Refill: 1  Hot flushes, perimenopausal Assessment & Plan: Continue with low sugar diet which has been helpful     GAD (generalized anxiety disorder) Assessment & Plan: Pdmp  reviewed Refilled xanax  0.5 mg bid prn  Continue lexapro  20 mg once daily       Follow Up Instructions: Return in about 6 months (around 08/21/2024) for f/u CPE.   I discussed the assessment and treatment plan with the patient. The patient was provided an opportunity to ask questions and all were answered. The patient agreed with the plan and demonstrated an understanding of the instructions.   The patient was advised to call back or seek an in-person evaluation if the symptoms worsen or if the condition fails to improve as anticipated.  The above assessment and management plan was discussed with the patient. The patient verbalized understanding of and has  agreed to the management plan. Patient is aware to call the clinic if symptoms persist or worsen. Patient is aware when to return to the clinic for a follow-up visit. Patient educated on when it is appropriate to go to the emergency department.     Ginger Patrick, MSN, APRN, FNP-C [facility]

## 2024-02-27 ENCOUNTER — Encounter: Payer: Self-pay | Admitting: Internal Medicine

## 2024-03-19 ENCOUNTER — Other Ambulatory Visit (HOSPITAL_BASED_OUTPATIENT_CLINIC_OR_DEPARTMENT_OTHER): Payer: Self-pay

## 2024-04-06 ENCOUNTER — Other Ambulatory Visit (HOSPITAL_BASED_OUTPATIENT_CLINIC_OR_DEPARTMENT_OTHER): Payer: Self-pay

## 2024-04-06 ENCOUNTER — Encounter (HOSPITAL_BASED_OUTPATIENT_CLINIC_OR_DEPARTMENT_OTHER): Payer: Self-pay

## 2024-04-06 DIAGNOSIS — Z9884 Bariatric surgery status: Secondary | ICD-10-CM | POA: Diagnosis not present

## 2024-04-06 DIAGNOSIS — E785 Hyperlipidemia, unspecified: Secondary | ICD-10-CM | POA: Diagnosis not present

## 2024-04-06 DIAGNOSIS — E66812 Obesity, class 2: Secondary | ICD-10-CM | POA: Diagnosis not present

## 2024-04-06 DIAGNOSIS — R7303 Prediabetes: Secondary | ICD-10-CM | POA: Diagnosis not present

## 2024-04-06 DIAGNOSIS — I10 Essential (primary) hypertension: Secondary | ICD-10-CM | POA: Diagnosis not present

## 2024-04-06 MED ORDER — ZEPBOUND 2.5 MG/0.5ML ~~LOC~~ SOAJ
SUBCUTANEOUS | 0 refills | Status: DC
  Filled 2024-04-06: qty 2, 28d supply, fill #0

## 2024-04-16 ENCOUNTER — Ambulatory Visit: Admitting: Family

## 2024-04-17 ENCOUNTER — Ambulatory Visit: Admitting: Family

## 2024-04-17 ENCOUNTER — Ambulatory Visit: Payer: Self-pay | Admitting: Family

## 2024-04-17 ENCOUNTER — Other Ambulatory Visit (HOSPITAL_BASED_OUTPATIENT_CLINIC_OR_DEPARTMENT_OTHER): Payer: Self-pay

## 2024-04-17 ENCOUNTER — Other Ambulatory Visit: Payer: Self-pay | Admitting: Family

## 2024-04-17 ENCOUNTER — Other Ambulatory Visit: Payer: Self-pay | Admitting: *Deleted

## 2024-04-17 ENCOUNTER — Encounter: Payer: Self-pay | Admitting: Family

## 2024-04-17 VITALS — BP 170/105 | HR 81 | Temp 98.1°F | Ht 65.0 in | Wt 232.0 lb

## 2024-04-17 DIAGNOSIS — E559 Vitamin D deficiency, unspecified: Secondary | ICD-10-CM

## 2024-04-17 DIAGNOSIS — Z6838 Body mass index (BMI) 38.0-38.9, adult: Secondary | ICD-10-CM | POA: Diagnosis not present

## 2024-04-17 DIAGNOSIS — R748 Abnormal levels of other serum enzymes: Secondary | ICD-10-CM

## 2024-04-17 DIAGNOSIS — E66812 Obesity, class 2: Secondary | ICD-10-CM

## 2024-04-17 DIAGNOSIS — I10 Essential (primary) hypertension: Secondary | ICD-10-CM

## 2024-04-17 DIAGNOSIS — F419 Anxiety disorder, unspecified: Secondary | ICD-10-CM

## 2024-04-17 DIAGNOSIS — R7303 Prediabetes: Secondary | ICD-10-CM

## 2024-04-17 LAB — VITAMIN D 25 HYDROXY (VIT D DEFICIENCY, FRACTURES): VITD: 30.31 ng/mL (ref 30.00–100.00)

## 2024-04-17 LAB — HEMOGLOBIN A1C: Hgb A1c MFr Bld: 5.9 % (ref 4.6–6.5)

## 2024-04-17 LAB — COMPREHENSIVE METABOLIC PANEL WITH GFR
ALT: 11 U/L (ref 3–35)
AST: 16 U/L (ref 5–37)
Albumin: 4 g/dL (ref 3.5–5.2)
Alkaline Phosphatase: 104 U/L (ref 39–117)
BUN: 11 mg/dL (ref 6–23)
CO2: 28 meq/L (ref 19–32)
Calcium: 8.7 mg/dL (ref 8.4–10.5)
Chloride: 105 meq/L (ref 96–112)
Creatinine, Ser: 0.78 mg/dL (ref 0.40–1.20)
GFR: 90.12 mL/min
Glucose, Bld: 84 mg/dL (ref 70–99)
Potassium: 3.9 meq/L (ref 3.5–5.1)
Sodium: 140 meq/L (ref 135–145)
Total Bilirubin: 0.5 mg/dL (ref 0.2–1.2)
Total Protein: 7.3 g/dL (ref 6.0–8.3)

## 2024-04-17 LAB — TSH: TSH: 1.62 u[IU]/mL (ref 0.35–5.50)

## 2024-04-17 MED ORDER — CONTRAVE 8-90 MG PO TB12
ORAL_TABLET | ORAL | 0 refills | Status: DC
  Filled 2024-04-17: qty 360, fill #0

## 2024-04-17 MED ORDER — ALPRAZOLAM 0.5 MG PO TABS
0.5000 mg | ORAL_TABLET | Freq: Two times a day (BID) | ORAL | 1 refills | Status: AC
  Filled 2024-04-17: qty 60, 30d supply, fill #0
  Filled 2024-05-17: qty 60, 30d supply, fill #1

## 2024-04-17 MED ORDER — AMLODIPINE BESYLATE 5 MG PO TABS
5.0000 mg | ORAL_TABLET | Freq: Every day | ORAL | 1 refills | Status: AC
  Filled 2024-04-17: qty 30, 30d supply, fill #0
  Filled 2024-05-12: qty 30, 30d supply, fill #1

## 2024-04-17 NOTE — Progress Notes (Signed)
 "  Established Patient Office Visit  Subjective:      CC:  Chief Complaint  Patient presents with   Hypertension    HPI: Michelle Wilson is a 48 y.o. female presenting on 04/17/2024 for Hypertension .  Discussed the use of AI scribe software for clinical note transcription with the patient, who gave verbal consent to proceed.  History of Present Illness Michelle Wilson is a 48 year old female with hypertension who presents for blood pressure management.  Her blood pressure readings at home are typically around 129/80 mmHg when checked in the evening. However, she notes that her blood pressure tends to be elevated when she is 'out and about' and does not usually sit for ten minutes before taking a reading. She has been taking her blood pressure medication, losartan  100 mg once daily, for over an hour before her morning readings. She is concerned about becoming 'immune' to her current medication and not realizing when her blood pressure is high.  She is also on Lexapro  and Xanax , taking Xanax  twice daily. She has not tried Wellbutrin  or bupropion  in the past. She is taking a vitamin D  supplement and a multivitamin.  She discusses her interest in weight management options and mentions previous attempts to get approval for Zepbound , which were unsuccessful. She is aware of the high costs associated with self-pay options for weight loss medications and expresses frustration with insurance coverage limitations.  She has a history of gallbladder issues, which are reportedly improving, and her last A1c was 5.6 in June or May. She is also mindful of her salt intake and denies swelling in her legs.         Social history:  Relevant past medical, surgical, family and social history reviewed and updated as indicated. Interim medical history since our last visit reviewed.  Allergies and medications reviewed and updated.  DATA REVIEWED: CHART IN EPIC     ROS: Negative unless  specifically indicated above in HPI.   Current Medications[1]        Objective:        BP (!) 170/105   Pulse 81   Temp 98.1 F (36.7 C) (Temporal)   Ht 5' 5 (1.651 m)   Wt 232 lb (105.2 kg)   SpO2 98%   BMI 38.61 kg/m   Physical Exam VITALS: BP- 170/100  Wt Readings from Last 3 Encounters:  04/17/24 232 lb (105.2 kg)  01/18/24 230 lb (104.3 kg)  01/09/24 232 lb (105.2 kg)    Physical Exam Constitutional:      General: She is not in acute distress.    Appearance: Normal appearance. She is normal weight. She is not ill-appearing, toxic-appearing or diaphoretic.  HENT:     Head: Normocephalic.  Cardiovascular:     Rate and Rhythm: Normal rate and regular rhythm.  Pulmonary:     Effort: Pulmonary effort is normal.  Musculoskeletal:        General: Normal range of motion.     Right lower leg: No edema.     Left lower leg: No edema.  Neurological:     General: No focal deficit present.     Mental Status: She is alert and oriented to person, place, and time. Mental status is at baseline.  Psychiatric:        Mood and Affect: Mood normal.        Behavior: Behavior normal.        Thought Content: Thought content normal.  Judgment: Judgment normal.          Results Labs Gallbladder laboratory studies (01/2024): Trending down Thyroid  studies (09/2023): Within normal limits HbA1c (08/2023): 5.6  Assessment & Plan:   Assessment and Plan Assessment & Plan Essential hypertension Blood pressure readings at home are generally well-controlled at 129/80 mmHg, but elevated readings occur when out and about. Current reading in office is 170/100 mmHg, indicating poor control. No symptoms of chest pain, palpitations, or shortness of breath reported. Discussed potential addition of amlodipine  to current losartan  regimen to improve blood pressure control. Emphasized importance of consistent blood pressure monitoring and lifestyle modifications. - Started  amlodipine  5 mg once daily for a few days, then increase if blood pressure remains elevated. - Continue losartan  100 mg once daily. -start amlodipine  5 mg, start with 1/2 tablet day one increase to one full tablet if blood pressure remains over 130/90  - Monitor blood pressure twice daily for a week, ensuring readings are taken 2 hours post-medication and after sitting for 5-10 minutes. - Bring blood pressure cuff to next appointment for comparison. - Focus on lifestyle modifications, including stress management and dietary changes.  Class 2 severe obesity with serious comorbidity Discussed challenges with insurance coverage for weight loss medications like Zepbound  and Wegovy. Consideration of Contrave  (bupropion  and naltrexone ) as an alternative, with potential benefits for weight loss and mood improvement. Discussed potential side effects, including gastrointestinal upset and possible heart rate elevation. Emphasized importance of blood pressure control before starting Contrave . - consider contrave  once blood pressure within controlled limits <130/90 goal  - Provided pamphlet on Contrave  and discussed potential side effects. - Delayed starting Contrave  until blood pressure is well-controlled. - Consider lifestyle modifications for weight management.  Generalized anxiety disorder Current management with escitalopram  and alprazolam  is effective. Discussed potential addition of bupropion  to escitalopram  for mood improvement, which may also aid in weight management. - Continue escitalopram  20 mg daily. - Continue alprazolam  0.5 mg twice daily as needed. - Will consider adding bupropion  to escitalopram  regimen for mood improvement.  Vitamin D  deficiency Currently taking vitamin D  supplements and a multivitamin. - Continue vitamin D  supplementation and multivitamin.  General health maintenance Discussed Pap smear frequency and insurance coverage. Last Pap smear was in November 2024. Discussed  potential for annual Pap smears if covered by insurance. - Schedule Pap smear if desired and covered by insurance. - Ordered CMP to assess liver and kidney function.  Recording duration: 20 minutes      Return in about 1 month (around 05/18/2024) for f/u blood pressure, f/u PAP.     Ginger Patrick, MSN, APRN, FNP-C [facility]        [1]  Current Outpatient Medications:    amLODipine  (NORVASC ) 5 MG tablet, Take 1 tablet (5 mg total) by mouth daily., Disp: 30 tablet, Rfl: 1   ALPRAZolam  (XANAX ) 0.5 MG tablet, Take 1 tablet (0.5 mg total) by mouth 2 (two) times daily., Disp: 60 tablet, Rfl: 1   atorvastatin  (LIPITOR) 10 MG tablet, Take 1 tablet (10 mg total) by mouth daily., Disp: 90 tablet, Rfl: 3   escitalopram  (LEXAPRO ) 20 MG tablet, Take 1 tablet (20 mg total) by mouth daily., Disp: 90 tablet, Rfl: 3   hydrOXYzine  (VISTARIL ) 50 MG capsule, Take 1 capsule (50 mg total) by mouth at bedtime as needed for anxiety, Disp: 30 capsule, Rfl: 2   losartan  (COZAAR ) 100 MG tablet, Take 1 tablet (100 mg total) by mouth daily., Disp: 90 tablet,  Rfl: 3   Multiple Vitamin (MULTIVITAMIN WITH MINERALS) TABS tablet, Take 1 tablet by mouth in the morning., Disp: , Rfl:   "

## 2024-04-17 NOTE — Telephone Encounter (Signed)
 Just an FYI  A lot of PCP have stopped recommending and or RX compounds due to the FDA release advising against it. Just to avoid the patient coming to us  saying we can just send an RX

## 2024-04-17 NOTE — Patient Instructions (Signed)
" °  Start amlodipine  1/2 tablet for the next few days  Along with losartan  100 mg once daily   If still elevated then go up to 5 mg once daily along with losartan  100 mg once daily  "

## 2024-05-17 ENCOUNTER — Other Ambulatory Visit (HOSPITAL_BASED_OUTPATIENT_CLINIC_OR_DEPARTMENT_OTHER): Payer: Self-pay

## 2024-05-18 ENCOUNTER — Other Ambulatory Visit (HOSPITAL_BASED_OUTPATIENT_CLINIC_OR_DEPARTMENT_OTHER): Payer: Self-pay

## 2024-05-18 ENCOUNTER — Telehealth: Admitting: Physician Assistant

## 2024-05-18 ENCOUNTER — Ambulatory Visit: Admitting: Family

## 2024-05-18 DIAGNOSIS — B9689 Other specified bacterial agents as the cause of diseases classified elsewhere: Secondary | ICD-10-CM | POA: Diagnosis not present

## 2024-05-18 DIAGNOSIS — J019 Acute sinusitis, unspecified: Secondary | ICD-10-CM | POA: Diagnosis not present

## 2024-05-18 MED ORDER — IPRATROPIUM BROMIDE 0.03 % NA SOLN
2.0000 | Freq: Two times a day (BID) | NASAL | 0 refills | Status: AC
  Filled 2024-05-18: qty 30, 30d supply, fill #0

## 2024-05-18 MED ORDER — AMOXICILLIN-POT CLAVULANATE 875-125 MG PO TABS
1.0000 | ORAL_TABLET | Freq: Two times a day (BID) | ORAL | 0 refills | Status: AC
  Filled 2024-05-18: qty 14, 7d supply, fill #0

## 2024-05-18 NOTE — Patient Instructions (Signed)
 " Michelle Wilson, thank you for joining Michelle Velma Lunger, PA-C for today's virtual visit.  While this provider is not your primary care provider (PCP), if your PCP is located in our provider database this encounter information will be shared with them immediately following your visit.   A [facility] MyChart account gives you access to today's visit and all your visits, tests, and labs performed at [facility]  click here if you don't have a [facility] MyChart account or go to [email or web address]  Consent: (Patient) Michelle Wilson provided verbal consent for this virtual visit at the beginning of the encounter.  Current Medications:  Current Outpatient Medications:    amoxicillin -clavulanate (AUGMENTIN ) 875-125 MG tablet, Take 1 tablet by mouth 2 (two) times daily., Disp: 14 tablet, Rfl: 0   ipratropium (ATROVENT) 0.03 % nasal spray, Place 2 sprays into both nostrils every 12 (twelve) hours., Disp: 30 mL, Rfl: 0   ALPRAZolam  (XANAX ) 0.5 MG tablet, Take 1 tablet (0.5 mg total) by mouth 2 (two) times daily., Disp: 60 tablet, Rfl: 1   amLODipine  (NORVASC ) 5 MG tablet, Take 1 tablet (5 mg total) by mouth daily., Disp: 30 tablet, Rfl: 1   atorvastatin  (LIPITOR) 10 MG tablet, Take 1 tablet (10 mg total) by mouth daily., Disp: 90 tablet, Rfl: 3   escitalopram  (LEXAPRO ) 20 MG tablet, Take 1 tablet (20 mg total) by mouth daily., Disp: 90 tablet, Rfl: 3   hydrOXYzine  (VISTARIL ) 50 MG capsule, Take 1 capsule (50 mg total) by mouth at bedtime as needed for anxiety, Disp: 30 capsule, Rfl: 2   losartan  (COZAAR ) 100 MG tablet, Take 1 tablet (100 mg total) by mouth daily., Disp: 90 tablet, Rfl: 3   Multiple Vitamin (MULTIVITAMIN WITH MINERALS) TABS tablet, Take 1 tablet by mouth in the morning., Disp: , Rfl:    Medications ordered in this encounter:  Meds ordered this encounter  Medications   ipratropium (ATROVENT) 0.03 % nasal spray    Sig: Place 2 sprays into both nostrils  every 12 (twelve) hours.    Dispense:  30 mL    Refill:  0    Supervising Provider:   LAMPTEY, PHILIP O [[id number]]   amoxicillin -clavulanate (AUGMENTIN ) 875-125 MG tablet    Sig: Take 1 tablet by mouth 2 (two) times daily.    Dispense:  14 tablet    Refill:  0    Supervising Provider:   BLAISE ALEENE KIDD [[id number]]     *If you need refills on other medications prior to your next appointment, please contact your pharmacy*  Follow-Up: Call back or seek an in-person evaluation if the symptoms worsen or if the condition fails to improve as anticipated.  [facility] [phone]  Other Instructions Please take antibiotic as directed.  Increase fluid intake.  Use Saline nasal spray.  Take a daily multivitamin. Use the Atrovent spray twice daily.  Place a humidifier in the bedroom.  Please call or return clinic if symptoms are not improving.  Sinusitis Sinusitis is redness, soreness, and swelling (inflammation) of the paranasal sinuses. Paranasal sinuses are air pockets within the bones of your face (beneath the eyes, the middle of the forehead, or above the eyes). In healthy paranasal sinuses, mucus is able to drain out, and air is able to circulate through them by way of your nose. However, when your paranasal sinuses are inflamed, mucus and air can become trapped. This can allow bacteria and other germs to grow and cause infection. Sinusitis can  develop quickly and last only a short time (acute) or continue over a long period (chronic). Sinusitis that lasts for more than 12 weeks is considered chronic.  CAUSES  Causes of sinusitis include: Allergies. Structural abnormalities, such as displacement of the cartilage that separates your nostrils (deviated septum), which can decrease the air flow through your nose and sinuses and affect sinus drainage. Functional abnormalities, such as when the small hairs (cilia) that line your sinuses and help remove mucus do not work properly or  are not present. SYMPTOMS  Symptoms of acute and chronic sinusitis are the same. The primary symptoms are pain and pressure around the affected sinuses. Other symptoms include: Upper toothache. Earache. Headache. Bad breath. Decreased sense of smell and taste. A cough, which worsens when you are lying flat. Fatigue. Fever. Thick drainage from your nose, which often is green and may contain pus (purulent). Swelling and warmth over the affected sinuses. DIAGNOSIS  Your caregiver will perform a physical exam. During the exam, your caregiver may: Look in your nose for signs of abnormal growths in your nostrils (nasal polyps). Tap over the affected sinus to check for signs of infection. View the inside of your sinuses (endoscopy) with a special imaging device with a light attached (endoscope), which is inserted into your sinuses. If your caregiver suspects that you have chronic sinusitis, one or more of the following tests may be recommended: Allergy tests. Nasal culture A sample of mucus is taken from your nose and sent to a lab and screened for bacteria. Nasal cytology A sample of mucus is taken from your nose and examined by your caregiver to determine if your sinusitis is related to an allergy. TREATMENT  Most cases of acute sinusitis are related to a viral infection and will resolve on their own within 10 days. Sometimes medicines are prescribed to help relieve symptoms (pain medicine, decongestants, nasal steroid sprays, or saline sprays).  However, for sinusitis related to a bacterial infection, your caregiver will prescribe antibiotic medicines. These are medicines that will help kill the bacteria causing the infection.  Rarely, sinusitis is caused by a fungal infection. In theses cases, your caregiver will prescribe antifungal medicine. For some cases of chronic sinusitis, surgery is needed. Generally, these are cases in which sinusitis recurs more than 3 times per year, despite other  treatments. HOME CARE INSTRUCTIONS  Drink plenty of water . Water  helps thin the mucus so your sinuses can drain more easily. Use a humidifier. Inhale steam 3 to 4 times a day (for example, sit in the bathroom with the shower running). Apply a warm, moist washcloth to your face 3 to 4 times a day, or as directed by your caregiver. Use saline nasal sprays to help moisten and clean your sinuses. Take over-the-counter or prescription medicines for pain, discomfort, or fever only as directed by your caregiver. SEEK IMMEDIATE MEDICAL CARE IF: You have increasing pain or severe headaches. You have nausea, vomiting, or drowsiness. You have swelling around your face. You have vision problems. You have a stiff neck. You have difficulty breathing. MAKE SURE YOU:  Understand these instructions. Will watch your condition. Will get help right away if you are not doing well or get worse. Document Released: 04/12/2005 Document Revised: 07/05/2011 Document Reviewed: 04/27/2011 [facility] Patient Information 2014 [place], [place].    If you have been instructed to have an in-person evaluation today at a local Urgent Care facility, please use the link below. It will take you to a list of all of  our available [facility], including address, phone number and hours of operation. Please do not delay care.  [facility]  If you or a family member do not have a primary care provider, use the link below to schedule a visit and establish care. When you choose a [facility] primary care physician or advanced practice provider, you gain a long-term partner in health. Find a Primary Care Provider  Learn more about [facility]'s in-office and virtual care options: [facility] [facility] - Get Care Now  "

## 2024-05-18 NOTE — Progress Notes (Signed)
 " Virtual Visit Consent   Michelle Wilson, you are scheduled for a virtual visit with a [facility] provider today. Just as with appointments in the office, your consent must be obtained to participate. Your consent will be active for this visit and any virtual visit you may have with one of our providers in the next 365 days. If you have a MyChart account, a copy of this consent can be sent to you electronically.  As this is a virtual visit, video technology does not allow for your provider to perform a traditional examination. This may limit your provider's ability to fully assess your condition. If your provider identifies any concerns that need to be evaluated in person or the need to arrange testing (such as labs, EKG, etc.), we will make arrangements to do so. Although advances in technology are sophisticated, we cannot ensure that it will always work on either your end or our end. If the connection with a video visit is poor, the visit may have to be switched to a telephone visit. With either a video or telephone visit, we are not always able to ensure that we have a secure connection.  By engaging in this virtual visit, you consent to the provision of healthcare and authorize for your insurance to be billed (if applicable) for the services provided during this visit. Depending on your insurance coverage, you may receive a charge related to this service.  I need to obtain your verbal consent now. Are you willing to proceed with your visit today? Michelle Wilson has provided verbal consent on 05/18/2024 for a virtual visit (video or telephone). Michelle Wilson, [place]  Date: 05/18/2024 11:03 AM   Virtual Visit via Video Note   I, Michelle Wilson, connected with  Michelle Wilson  ([id number], [birth year]) on 05/18/24 at 11:15 AM EST by a video-enabled telemedicine application and verified that I am speaking with the correct person using two identifiers.  Location: Patient: Virtual  Visit Location Patient: Home Provider: Virtual Visit Location Provider: Home Office   I discussed the limitations of evaluation and management by telemedicine and the availability of in person appointments. The patient expressed understanding and agreed to proceed.    History of Present Illness: Michelle Wilson is a 49 y.o. who identifies as a female who was assigned female at birth, and is being seen today for possible sinus infection. Endorses nasal congestion and sinus pressure for several days, now with change in nasal discharge, sinus pain and new onset of fever. Notes family members sick recently but got better quickly. OTC -- taken Mucinex.  HPI: HPI  Problems:  Patient Active Problem List   Diagnosis Date Noted   Hot flushes, perimenopausal 01/09/2024   Cholelithiasis 10/31/2023   Choledocholithiasis 10/31/2023   Abnormal LFTs 10/31/2023   Sleep difficulties 09/23/2023   Prediabetes 03/21/2023   Elevated LDL cholesterol level 03/21/2023   Class 2 severe obesity due to excess calories with serious comorbidity and body mass index (BMI) of 38.0 to 38.9 in adult 06/22/2022   Hypokalemia 12/24/2021   Iron deficiency anemia 12/24/2021   Allergic rhinitis 10/19/2021   GAD (generalized anxiety disorder) 07/03/2021   Vitamin D  deficiency 06/23/2020   Mild hyperlipidemia 07/22/2017   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  Resting comfortably at home.  Head is normocephalic, atraumatic.  No  labored breathing.  Speech is clear and coherent with logical content.  Patient is alert and oriented at baseline.   Assessment and Plan: 1. Acute bacterial sinusitis (Primary) - ipratropium (ATROVENT) 0.03 % nasal spray; Place 2 sprays into both nostrils every 12 (twelve)  hours.  Dispense: 30 mL; Refill: 0 - amoxicillin -clavulanate (AUGMENTIN ) 875-125 MG tablet; Take 1 tablet by mouth 2 (two) times daily.  Dispense: 14 tablet; Refill: 0  Rx Augmentin .  Increase fluids.  Rest.  Saline nasal spray.  Probiotic.  Mucinex as directed.  Humidifier in bedroom. Atrovent spray per orders.  Call or return to clinic if symptoms are not improving.   Follow Up Instructions: I discussed the assessment and treatment plan with the patient. The patient was provided an opportunity to ask questions and all were answered. The patient agreed with the plan and demonstrated an understanding of the instructions.  A copy of instructions were sent to the patient via MyChart unless otherwise noted below.   The patient was advised to call back or seek an in-person evaluation if the symptoms worsen or if the condition fails to improve as anticipated.    Michelle Velma Lunger, PA-C    [1]  Allergies Allergen Reactions   Ambien  [Zolpidem ] Other (See Comments)    Sleep walking    Trazodone  And Nefazodone Other (See Comments)    dizziness  [2]  Current Outpatient Medications:    amoxicillin -clavulanate (AUGMENTIN ) 875-125 MG tablet, Take 1 tablet by mouth 2 (two) times daily., Disp: 14 tablet, Rfl: 0   ipratropium (ATROVENT) 0.03 % nasal spray, Place 2 sprays into both nostrils every 12 (twelve) hours., Disp: 30 mL, Rfl: 0   ALPRAZolam  (XANAX ) 0.5 MG tablet, Take 1 tablet (0.5 mg total) by mouth 2 (two) times daily., Disp: 60 tablet, Rfl: 1   amLODipine  (NORVASC ) 5 MG tablet, Take 1 tablet (5 mg total) by mouth daily., Disp: 30 tablet, Rfl: 1   atorvastatin  (LIPITOR) 10 MG tablet, Take 1 tablet (10 mg total) by mouth daily., Disp: 90 tablet, Rfl: 3   escitalopram  (LEXAPRO ) 20 MG tablet, Take 1 tablet (20 mg total) by mouth daily., Disp: 90 tablet, Rfl: 3   hydrOXYzine  (VISTARIL ) 50 MG capsule, Take 1 capsule (50 mg total) by mouth at bedtime as needed for anxiety, Disp: 30 capsule, Rfl:  2   losartan  (COZAAR ) 100 MG tablet, Take 1 tablet (100 mg total) by mouth daily., Disp: 90 tablet, Rfl: 3   Multiple Vitamin (MULTIVITAMIN WITH MINERALS) TABS tablet, Take 1 tablet by mouth in the morning., Disp: , Rfl:   "

## 2024-05-22 ENCOUNTER — Other Ambulatory Visit: Payer: Self-pay

## 2024-05-23 ENCOUNTER — Other Ambulatory Visit: Payer: Self-pay

## 2024-05-23 ENCOUNTER — Ambulatory Visit: Admitting: Family

## 2024-05-30 ENCOUNTER — Ambulatory Visit: Admitting: Family

## 2024-06-14 ENCOUNTER — Ambulatory Visit: Admitting: Family

## 2024-08-20 ENCOUNTER — Encounter: Admitting: Family
